# Patient Record
Sex: Female | Born: 1955
Health system: Southern US, Community
[De-identification: ages and names within clinical notes are randomized; demographics above are authoritative.]

## PROBLEM LIST (undated history)

## (undated) DIAGNOSIS — R011 Cardiac murmur, unspecified: Secondary | ICD-10-CM

## (undated) DIAGNOSIS — E119 Type 2 diabetes mellitus without complications: Secondary | ICD-10-CM

## (undated) DIAGNOSIS — I10 Essential (primary) hypertension: Secondary | ICD-10-CM

## (undated) DIAGNOSIS — E785 Hyperlipidemia, unspecified: Secondary | ICD-10-CM

## (undated) DIAGNOSIS — E079 Disorder of thyroid, unspecified: Secondary | ICD-10-CM

## (undated) DIAGNOSIS — M1990 Unspecified osteoarthritis, unspecified site: Secondary | ICD-10-CM

## (undated) HISTORY — DX: Cardiac murmur, unspecified: R01.1

## (undated) HISTORY — DX: Hyperlipidemia, unspecified: E78.5

## (undated) HISTORY — DX: Essential (primary) hypertension: I10

## (undated) HISTORY — DX: Unspecified osteoarthritis, unspecified site: M19.90

## (undated) HISTORY — DX: Disorder of thyroid, unspecified: E07.9

## (undated) LAB — LIPID PANEL
HDL: 35 mg/dL (ref 35–70)
Triglycerides: 166 mg/dL — AB (ref 40–160)

## (undated) LAB — BASIC METABOLIC PANEL
Potassium: 4.7 mmol/L (ref 3.4–5.3)
Sodium: 140 mmol/L (ref 137–147)

## (undated) LAB — HEPATIC FUNCTION PANEL
ALT: 7 U/L (ref 7–35)
AST: 9 U/L — AB (ref 13–35)

---

## 1999-08-11 HISTORY — PX: ABDOMINAL HYSTERECTOMY: SHX81

## 2005-08-10 LAB — HM COLONOSCOPY: HM COLON: NORMAL

## 2005-09-11 ENCOUNTER — Ambulatory Visit: Payer: Self-pay | Admitting: Family Medicine

## 2006-02-09 ENCOUNTER — Ambulatory Visit: Payer: Self-pay | Admitting: Gastroenterology

## 2006-12-21 ENCOUNTER — Ambulatory Visit: Payer: Self-pay | Admitting: Family Medicine

## 2008-01-04 ENCOUNTER — Ambulatory Visit: Payer: Self-pay | Admitting: Family Medicine

## 2008-03-06 DIAGNOSIS — F17210 Nicotine dependence, cigarettes, uncomplicated: Secondary | ICD-10-CM | POA: Insufficient documentation

## 2009-01-15 ENCOUNTER — Ambulatory Visit: Payer: Self-pay | Admitting: Family Medicine

## 2009-01-29 ENCOUNTER — Ambulatory Visit: Payer: Self-pay | Admitting: Family Medicine

## 2010-02-26 DIAGNOSIS — E559 Vitamin D deficiency, unspecified: Secondary | ICD-10-CM | POA: Insufficient documentation

## 2010-04-02 ENCOUNTER — Ambulatory Visit: Payer: Self-pay | Admitting: Family Medicine

## 2010-05-27 LAB — HM PAP SMEAR: HM PAP: NORMAL

## 2011-05-20 ENCOUNTER — Ambulatory Visit: Payer: Self-pay | Admitting: Family Medicine

## 2011-07-22 ENCOUNTER — Ambulatory Visit: Payer: Self-pay | Admitting: Family Medicine

## 2012-05-24 ENCOUNTER — Ambulatory Visit: Payer: Self-pay | Admitting: Family Medicine

## 2013-01-09 ENCOUNTER — Ambulatory Visit: Payer: Self-pay | Admitting: Family Medicine

## 2013-01-09 LAB — HM DEXA SCAN: HM Dexa Scan: NORMAL

## 2013-06-06 ENCOUNTER — Ambulatory Visit: Payer: Self-pay | Admitting: Family Medicine

## 2014-03-05 LAB — LIPID PANEL
Cholesterol: 132 mg/dL (ref 0–200)
HDL: 42 mg/dL (ref 35–70)
LDL Cholesterol: 62 mg/dL
TRIGLYCERIDES: 140 mg/dL (ref 40–160)

## 2014-06-07 ENCOUNTER — Ambulatory Visit: Payer: Self-pay | Admitting: Family Medicine

## 2014-06-07 LAB — HM MAMMOGRAPHY: HM Mammogram: NORMAL

## 2014-08-18 ENCOUNTER — Emergency Department: Payer: Self-pay | Admitting: Emergency Medicine

## 2014-08-18 LAB — URINALYSIS, COMPLETE
Bilirubin,UR: NEGATIVE
GLUCOSE, UR: NEGATIVE mg/dL (ref 0–75)
Ketone: NEGATIVE
NITRITE: POSITIVE
Ph: 6 (ref 4.5–8.0)
Protein: 100
RBC,UR: 61 /HPF (ref 0–5)
SPECIFIC GRAVITY: 1.013 (ref 1.003–1.030)
Squamous Epithelial: 3
WBC UR: 344 /HPF (ref 0–5)

## 2014-08-18 LAB — COMPREHENSIVE METABOLIC PANEL
Albumin: 3.5 g/dL (ref 3.4–5.0)
Alkaline Phosphatase: 105 U/L
Anion Gap: 5 — ABNORMAL LOW (ref 7–16)
BUN: 9 mg/dL (ref 7–18)
Bilirubin,Total: 0.4 mg/dL (ref 0.2–1.0)
CHLORIDE: 100 mmol/L (ref 98–107)
CREATININE: 1.06 mg/dL (ref 0.60–1.30)
Calcium, Total: 9.7 mg/dL (ref 8.5–10.1)
Co2: 28 mmol/L (ref 21–32)
EGFR (Non-African Amer.): 57 — ABNORMAL LOW
Glucose: 109 mg/dL — ABNORMAL HIGH (ref 65–99)
OSMOLALITY: 266 (ref 275–301)
Potassium: 3.9 mmol/L (ref 3.5–5.1)
SGOT(AST): 11 U/L — ABNORMAL LOW (ref 15–37)
SGPT (ALT): 18 U/L
SODIUM: 133 mmol/L — AB (ref 136–145)
Total Protein: 7.2 g/dL (ref 6.4–8.2)

## 2014-08-18 LAB — CBC
HCT: 35.7 % (ref 35.0–47.0)
HGB: 11.7 g/dL — ABNORMAL LOW (ref 12.0–16.0)
MCH: 25.8 pg — ABNORMAL LOW (ref 26.0–34.0)
MCHC: 32.6 g/dL (ref 32.0–36.0)
MCV: 79 fL — AB (ref 80–100)
Platelet: 191 10*3/uL (ref 150–440)
RBC: 4.52 10*6/uL (ref 3.80–5.20)
RDW: 14.5 % (ref 11.5–14.5)
WBC: 14.8 10*3/uL — ABNORMAL HIGH (ref 3.6–11.0)

## 2014-08-18 LAB — ED INFLUENZA
H1N1FLUPCR: NOT DETECTED
INFLBPCR: NEGATIVE
Influenza A By PCR: NEGATIVE

## 2014-08-20 LAB — URINE CULTURE

## 2014-10-18 LAB — HEMOGLOBIN A1C: Hgb A1c MFr Bld: 5.4 % (ref 4.0–6.0)

## 2014-11-29 ENCOUNTER — Encounter: Payer: Self-pay | Admitting: Family Medicine

## 2015-01-20 ENCOUNTER — Encounter: Payer: Self-pay | Admitting: Family Medicine

## 2015-01-20 DIAGNOSIS — E133299 Other specified diabetes mellitus with mild nonproliferative diabetic retinopathy without macular edema, unspecified eye: Secondary | ICD-10-CM | POA: Insufficient documentation

## 2015-01-20 DIAGNOSIS — E039 Hypothyroidism, unspecified: Secondary | ICD-10-CM | POA: Insufficient documentation

## 2015-01-20 DIAGNOSIS — Z862 Personal history of diseases of the blood and blood-forming organs and certain disorders involving the immune mechanism: Secondary | ICD-10-CM | POA: Insufficient documentation

## 2015-01-20 DIAGNOSIS — M25579 Pain in unspecified ankle and joints of unspecified foot: Secondary | ICD-10-CM | POA: Insufficient documentation

## 2015-01-20 DIAGNOSIS — E669 Obesity, unspecified: Secondary | ICD-10-CM | POA: Insufficient documentation

## 2015-01-20 DIAGNOSIS — E1142 Type 2 diabetes mellitus with diabetic polyneuropathy: Secondary | ICD-10-CM | POA: Insufficient documentation

## 2015-01-20 DIAGNOSIS — E785 Hyperlipidemia, unspecified: Secondary | ICD-10-CM | POA: Insufficient documentation

## 2015-01-20 DIAGNOSIS — E13319 Other specified diabetes mellitus with unspecified diabetic retinopathy without macular edema: Secondary | ICD-10-CM

## 2015-01-20 DIAGNOSIS — E668 Other obesity: Secondary | ICD-10-CM | POA: Insufficient documentation

## 2015-01-20 DIAGNOSIS — H35039 Hypertensive retinopathy, unspecified eye: Secondary | ICD-10-CM | POA: Insufficient documentation

## 2015-01-20 DIAGNOSIS — J309 Allergic rhinitis, unspecified: Secondary | ICD-10-CM | POA: Insufficient documentation

## 2015-01-20 DIAGNOSIS — D72829 Elevated white blood cell count, unspecified: Secondary | ICD-10-CM | POA: Insufficient documentation

## 2015-01-20 DIAGNOSIS — H11139 Conjunctival pigmentations, unspecified eye: Secondary | ICD-10-CM | POA: Insufficient documentation

## 2015-01-20 DIAGNOSIS — B353 Tinea pedis: Secondary | ICD-10-CM | POA: Insufficient documentation

## 2015-01-20 DIAGNOSIS — I10 Essential (primary) hypertension: Secondary | ICD-10-CM | POA: Insufficient documentation

## 2015-01-21 ENCOUNTER — Ambulatory Visit (INDEPENDENT_AMBULATORY_CARE_PROVIDER_SITE_OTHER): Payer: BLUE CROSS/BLUE SHIELD | Admitting: Family Medicine

## 2015-01-21 ENCOUNTER — Encounter: Payer: Self-pay | Admitting: Family Medicine

## 2015-01-21 VITALS — BP 118/72 | HR 80 | Temp 98.5°F | Resp 16 | Ht 67.0 in | Wt 264.2 lb

## 2015-01-21 DIAGNOSIS — M25562 Pain in left knee: Secondary | ICD-10-CM

## 2015-01-21 DIAGNOSIS — E119 Type 2 diabetes mellitus without complications: Secondary | ICD-10-CM | POA: Diagnosis not present

## 2015-01-21 DIAGNOSIS — H35039 Hypertensive retinopathy, unspecified eye: Secondary | ICD-10-CM | POA: Diagnosis not present

## 2015-01-21 DIAGNOSIS — B353 Tinea pedis: Secondary | ICD-10-CM | POA: Diagnosis not present

## 2015-01-21 DIAGNOSIS — G8929 Other chronic pain: Secondary | ICD-10-CM | POA: Diagnosis not present

## 2015-01-21 DIAGNOSIS — E1340 Other specified diabetes mellitus with diabetic neuropathy, unspecified: Secondary | ICD-10-CM | POA: Diagnosis not present

## 2015-01-21 DIAGNOSIS — D72829 Elevated white blood cell count, unspecified: Secondary | ICD-10-CM

## 2015-01-21 DIAGNOSIS — R600 Localized edema: Secondary | ICD-10-CM | POA: Diagnosis not present

## 2015-01-21 DIAGNOSIS — E785 Hyperlipidemia, unspecified: Secondary | ICD-10-CM | POA: Diagnosis not present

## 2015-01-21 DIAGNOSIS — E1342 Other specified diabetes mellitus with diabetic polyneuropathy: Secondary | ICD-10-CM

## 2015-01-21 DIAGNOSIS — I10 Essential (primary) hypertension: Secondary | ICD-10-CM

## 2015-01-21 LAB — POCT UA - MICROALBUMIN: Microalbumin Ur, POC: NEGATIVE mg/L

## 2015-01-21 LAB — POCT GLYCOSYLATED HEMOGLOBIN (HGB A1C): Hemoglobin A1C: 5.6

## 2015-01-21 MED ORDER — METOPROLOL SUCCINATE ER 200 MG PO TB24: 200.0000 mg | ORAL_TABLET | Freq: Every day | ORAL | Status: DC

## 2015-01-21 MED ORDER — METFORMIN HCL 500 MG PO TABS: 500.0000 mg | ORAL_TABLET | Freq: Every day | ORAL | Status: DC

## 2015-01-21 MED ORDER — MELOXICAM 7.5 MG PO TABS: 7.5000 mg | ORAL_TABLET | Freq: Every day | ORAL | Status: DC

## 2015-01-21 MED ORDER — AMLODIPINE BESY-BENAZEPRIL HCL 5-40 MG PO CAPS: 1.0000 | ORAL_CAPSULE | Freq: Every day | ORAL | Status: DC

## 2015-01-21 MED ORDER — TERBINAFINE HCL 250 MG PO TABS: 250.0000 mg | ORAL_TABLET | Freq: Every day | ORAL | Status: DC

## 2015-01-21 MED ORDER — PRAVASTATIN SODIUM 40 MG PO TABS: 40.0000 mg | ORAL_TABLET | Freq: Every day | ORAL | Status: DC

## 2015-01-21 MED ORDER — ACETAMINOPHEN 500 MG PO TABS: 500.0000 mg | ORAL_TABLET | Freq: Two times a day (BID) | ORAL | Status: AC

## 2015-01-21 NOTE — Patient Instructions (Signed)

## 2015-01-21 NOTE — Progress Notes (Signed)
Name: Charlene Hardy   MRN: [id number]    DOB: 01-Jun-1956   Date:01/21/2015       Progress Note  Subjective  Chief Complaint  Chief Complaint  Patient presents with  . Hypertension    some edema in left ankle/foot  . Hyperlipidemia  . Hypothyroidism    dry skin  . Diabetes    has not been checking BG    HPI  Left lower extremity swelling: she developed left knee effusion about one month ago, she denies any falls or change in activity ( she always works standing up for 8 hours ), swelling descended to her left ankle, worse after activity and better after rest, painful only after work, but usually no pain. No redness, no increase in warmth.  No rashes.  She has tinea pedis and was not able to afford cream for therapy.  Denies calf tenderness.  Swelling is getting worse again and going up to her leg. No previous history of DVT's  HTN: taking medication, bp is at goal, swelling unlikely related to amlodipine since it is unilateral  DM: taking metformin, she tries to eat a diabetic diet, she states she is only active at work, and explained importance of exercising at least 30 minutes daily. Eye exam is up to date. May need to see a podiatrist for nail trimming - but she refuses at this time  Hypothyroidism: she denies constipation or fatigue, but she has dry skin.  Taking levothyroxine and seems to be controlling symptoms   Hyperlipidemia: taking Pravastatin and denies side effects, last HDL was low and discussed importance of eating tree nuts, fish and exercise   Patient Active Problem List   Diagnosis Date Noted  . Allergic rhinitis 01/20/2015  . 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/2015  . Complication of diabetes mellitus 01/20/2015  . Diabetic sensorimotor neuropathy 01/20/2015  . Dyslipidemia 01/20/2015  . Essential (primary) hypertension 01/20/2015  . H/O iron deficiency anemia 01/20/2015  . Calcium blood increased 01/20/2015  . Hypertensive retinopathy 01/20/2015  . Adult  hypothyroidism 01/20/2015  . Ankle pain 01/20/2015  . Extreme obesity 01/20/2015  . Background retinopathy due to secondary diabetes 01/20/2015  . Tinea pedis 01/20/2015  . Leucocytosis 01/20/2015  . Vitamin D deficiency 02/26/2010  . Cigarette smoker 03/06/2008    Past Surgical History  Procedure Laterality Date  . Abdominal hysterectomy  08/11/1999    still has ovaries    Family History  Problem Relation Age of Onset  . Multiple sclerosis Mother   . Diabetes Father     History   Social History  . Marital Status: Single    Spouse Name: N/A  . Number of Children: N/A  . Years of Education: N/A   Occupational History  . Not on file.   Social History Main Topics  . Smoking status: Current Every Day Smoker -- 1.00 packs/day for 37 years    Types: Cigarettes    Start date: 08/10/1976  . Smokeless tobacco: Never Used  . Alcohol Use: No  . Drug Use: No  . Sexual Activity: Not Currently   Other Topics Concern  . Not on file   Social History Narrative     Current outpatient prescriptions:  .  Aclidinium Bromide (TUDORZA PRESSAIR) 400 MCG/ACT AEPB, Inhale 1 puff into the lungs 2 (two) times daily., Disp: , Rfl:  .  amLODipine-benazepril (LOTREL) 5-40 MG per capsule, Take 1 capsule by mouth daily., Disp: 90 capsule, Rfl: 1 .  aspirin 81 MG tablet, Take 1 tablet by  mouth daily., Disp: , Rfl:  .  Cholecalciferol (VITAMIN D) 2000 UNITS tablet, Take 1 tablet by mouth daily., Disp: , Rfl:  .  ferrous sulfate 325 (65 FE) MG tablet, Take 1 tablet by mouth daily., Disp: , Rfl:  .  levothyroxine (SYNTHROID) 112 MCG tablet, Take 1 tablet by mouth daily., Disp: , Rfl:  .  loratadine (CLARITIN) 10 MG tablet, Take 1 tablet by mouth daily., Disp: , Rfl:  .  meloxicam (MOBIC) 7.5 MG tablet, Take 1 tablet (7.5 mg total) by mouth daily., Disp: 90 tablet, Rfl: 1 .  metFORMIN (GLUCOPHAGE) 500 MG tablet, Take 1 tablet (500 mg total) by mouth daily., Disp: 90 tablet, Rfl: 1 .  metoprolol  (TOPROL-XL) 200 MG 24 hr tablet, Take 1 tablet (200 mg total) by mouth at bedtime., Disp: 90 tablet, Rfl: 1 .  MULTIPLE VITAMINS-MINERALS ER PO, Take 1 tablet by mouth daily., Disp: , Rfl:  .  omega-3 fish oil (MAXEPA) 1000 MG CAPS capsule, Take 1 capsule by mouth daily., Disp: , Rfl:  .  Polyethylene Glycol 3350 POWD, Take 1 Dose by mouth as needed., Disp: , Rfl:  .  pravastatin (PRAVACHOL) 40 MG tablet, Take 1 tablet (40 mg total) by mouth daily., Disp: 90 tablet, Rfl: 1 .  acetaminophen (TYLENOL) 500 MG tablet, Take 1 tablet (500 mg total) by mouth 2 (two) times daily., Disp: 30 tablet, Rfl: 0 .  terbinafine (LAMISIL) 250 MG tablet, Take 1 tablet (250 mg total) by mouth daily., Disp: 14 tablet, Rfl: 0  Allergies  Allergen Reactions  . Hydrochlorothiazide     hyperparathyroidism  . Pneumovax [Pneumococcal Polysaccharide Vaccine]      ROS  Constitutional: Negative for fever or significant weight change.  Respiratory: Negative for shortness of breath.  occasional cough Cardiovascular: Negative for chest pain or palpitations.  Gastrointestinal: Negative for abdominal pain, no bowel changes.  Musculoskeletal: Negative for gait problem , she has left knee pain and now left ankle swelling Skin: Negative for rash.  Neurological: Negative for dizziness or headache.  No other specific complaints in a complete review of systems (except as listed in HPI above).  Objective  Filed Vitals:   01/21/15 0935  BP: 118/72  Pulse: 80  Temp: 98.5 F (36.9 C)  TempSrc: Oral  Resp: 16  Height: '5\' 7"'  (1.702 m)  Weight: 264 lb 3.2 oz (119.84 kg)  SpO2: 97%    Body mass index is 41.37 kg/(m^2).  Physical Exam   Constitutional: Patient appears well-developed and well-nourished. No distress. Obese HENT: Head: Normocephalic and atraumati Nose: Nose normal. Mouth/Throat: Oropharynx is clear and moist. No oropharyngeal exudate.  Eyes: Conjunctivae and EOM are normal. Pupils are equal, round, and  reactive to light. No scleral icterus.  Neck: Normal range of motion. Neck supple. No JVD present. No thyromegaly present.  Cardiovascular: Normal rate, regular rhythm and normal heart sounds.  No murmur heard.She has 3 plus pitting on left ankle, 2 plus on distal tibia, neg calf tenderness, no tenderness to touch of ankle, normal passive ROB, no redness or increase in warmth Pulmonary/Chest: Effort normal , scattered rhonchi. No respiratory distress. Abdominal: Soft. Bowel sounds are normal, no distension. There is no tenderness. no masses Musculoskeletal: Normal range of motion.No gross deformities Neurological: he is alert and oriented to person, place, and time. No cranial nerve deficit. Coordination, balance, strength, speech and gait are normal.  Skin: Skin is warm and dry. No rash noted. No erythema.  Psychiatric: Patient has a normal  mood and affect. behavior is normal. Judgment and thought content normal.   Recent Results (from the past 2160 hour(s))  POCT UA - Microalbumin     Status: Normal   Collection Time: 01/21/15 10:03 AM  Result Value Ref Range   Microalbumin Ur, POC negative mg/L   Creatinine, POC  mg/dL   Albumin/Creatinine Ratio, Urine, POC    POCT HgB A1C     Status: Normal   Collection Time: 01/21/15 10:08 AM  Result Value Ref Range   Hemoglobin A1C 5.6     Diabetic Foot Exam: Diabetic Foot Exam - Simple   Simple Foot Form  Visual Inspection  See comments:  Yes  Sensation Testing  Intact to touch and monofilament testing bilaterally:  Yes  Pulse Check  See comments:  Yes  Comments  Swollen left foot, hard to feel posterior tibialis pulse, long thick nails and tinea pedis noticed       PHQ2/9: Depression screen PHQ 2/9 01/21/2015  Decreased Interest 0  Down, Depressed, Hopeless 0  PHQ - 2 Score 0     Fall Risk: Fall Risk  01/21/2015  Falls in the past year? No     Assessment & Plan  1. Secondary diabetes with peripheral  neuropathy Monofilament, she has neuropathy - usually associated with pain, but not in pain at this time - POCT UA - Microalbumin - POCT HgB A1C - metFORMIN (GLUCOPHAGE) 500 MG tablet; Take 1 tablet (500 mg total) by mouth daily.  Dispense: 90 tablet; Refill: 1  2. Tinea pedis of both feet Could not afford topical medication, we will try po medication - terbinafine (LAMISIL) 250 MG tablet; Take 1 tablet (250 mg total) by mouth daily.  Dispense: 14 tablet; Refill: 0  3. Leucocytosis Recheck levels - CBC  4. Edema of left lower extremity Needs to rule out vascular problem, non tender, unlikely gout, but she has neuropathy - Ambulatory referral to Vascular Surgery - Uric acid - Sed Rate (ESR) X-ray  5. Essential (primary) hypertension At goal, continue medication  6. Hypertensive retinopathy, unspecified laterality Continue follow up with ophthalmologist and good bp control - amLODipine-benazepril (LOTREL) 5-40 MG per capsule; Take 1 capsule by mouth daily.  Dispense: 90 capsule; Refill: 1 - metoprolol (TOPROL-XL) 200 MG 24 hr tablet; Take 1 tablet (200 mg total) by mouth at bedtime.  Dispense: 90 tablet; Refill: 1  7. Dyslipidemia  - pravastatin (PRAVACHOL) 40 MG tablet; Take 1 tablet (40 mg total) by mouth daily.  Dispense: 90 tablet; Refill: 1  8. Knee pain, chronic, left Advised to take Tylenol instead of meloxicam daily if possible - acetaminophen (TYLENOL) 500 MG tablet; Take 1 tablet (500 mg total) by mouth 2 (two) times daily.  Dispense: 30 tablet; Refill: 0 - meloxicam (MOBIC) 7.5 MG tablet; Take 1 tablet (7.5 mg total) by mouth daily.  Dispense: 90 tablet; Refill: 1

## 2015-01-22 LAB — CBC
Hematocrit: 39.8 % (ref 34.0–46.6)
Hemoglobin: 12.7 g/dL (ref 11.1–15.9)
MCH: 25.8 pg — AB (ref 26.6–33.0)
MCHC: 31.9 g/dL (ref 31.5–35.7)
MCV: 81 fL (ref 79–97)
PLATELETS: 283 10*3/uL (ref 150–379)
RBC: 4.92 x10E6/uL (ref 3.77–5.28)
RDW: 14 % (ref 12.3–15.4)
WBC: 9.3 10*3/uL (ref 3.4–10.8)

## 2015-01-22 LAB — URIC ACID: Uric Acid: 6.5 mg/dL (ref 2.5–7.1)

## 2015-01-22 LAB — SEDIMENTATION RATE: Sed Rate: 8 mm/hr (ref 0–40)

## 2015-04-25 ENCOUNTER — Encounter: Payer: Self-pay | Admitting: Family Medicine

## 2015-04-25 ENCOUNTER — Ambulatory Visit (INDEPENDENT_AMBULATORY_CARE_PROVIDER_SITE_OTHER): Payer: BLUE CROSS/BLUE SHIELD | Admitting: Family Medicine

## 2015-04-25 VITALS — BP 126/84 | HR 84 | Temp 98.8°F | Resp 16 | Ht 67.0 in | Wt 266.0 lb

## 2015-04-25 DIAGNOSIS — M25572 Pain in left ankle and joints of left foot: Secondary | ICD-10-CM | POA: Diagnosis not present

## 2015-04-25 DIAGNOSIS — E559 Vitamin D deficiency, unspecified: Secondary | ICD-10-CM

## 2015-04-25 DIAGNOSIS — F17210 Nicotine dependence, cigarettes, uncomplicated: Secondary | ICD-10-CM

## 2015-04-25 DIAGNOSIS — Z114 Encounter for screening for human immunodeficiency virus [HIV]: Secondary | ICD-10-CM

## 2015-04-25 DIAGNOSIS — Z720 Tobacco use: Secondary | ICD-10-CM

## 2015-04-25 DIAGNOSIS — E038 Other specified hypothyroidism: Secondary | ICD-10-CM

## 2015-04-25 DIAGNOSIS — B353 Tinea pedis: Secondary | ICD-10-CM | POA: Diagnosis not present

## 2015-04-25 DIAGNOSIS — E1142 Type 2 diabetes mellitus with diabetic polyneuropathy: Secondary | ICD-10-CM

## 2015-04-25 DIAGNOSIS — E118 Type 2 diabetes mellitus with unspecified complications: Secondary | ICD-10-CM

## 2015-04-25 DIAGNOSIS — Z23 Encounter for immunization: Secondary | ICD-10-CM | POA: Diagnosis not present

## 2015-04-25 DIAGNOSIS — K5900 Constipation, unspecified: Secondary | ICD-10-CM | POA: Insufficient documentation

## 2015-04-25 DIAGNOSIS — I10 Essential (primary) hypertension: Secondary | ICD-10-CM | POA: Diagnosis not present

## 2015-04-25 DIAGNOSIS — E785 Hyperlipidemia, unspecified: Secondary | ICD-10-CM | POA: Diagnosis not present

## 2015-04-25 LAB — POCT UA - MICROALBUMIN: MICROALBUMIN (UR) POC: 20 mg/L

## 2015-04-25 MED ORDER — NAFTIFINE HCL 2 % EX CREA: 1.0000 g | TOPICAL_CREAM | Freq: Two times a day (BID) | CUTANEOUS | Status: DC

## 2015-04-25 NOTE — Progress Notes (Signed)
Called prescription into [facility] at 11:05 a.m. TJ

## 2015-04-25 NOTE — Progress Notes (Signed)
Name: Charlene Hardy   MRN: [id number]    DOB: [birth year]   Date:04/25/2015       Progress Note  Subjective  Chief Complaint  Chief Complaint  Patient presents with  . Medication Refill    follow-up  . Hypertension  . Hyperlipidemia  . Hypothyroidism  . Gout    left ankle    HPI  HTN: taking medication as prescribed, bp is at goal, denies side effects. No chest pain or palpitation  Hyperlipidemia: she has been taking Pravastatin denies muscles aches.  Compliant with medication.  Left ankle pain: uric acid was normal in June.  She has been having pain daily, and swells, seen by vascular surgeon and advised to wear compression stocking hoses. Works on [employer] and left ankle is a little swollen when she does not wear compression stocking hoses. Pain is described as aching, intermittent, worse with activity and resolves with rest. No redness, no increase in warmth.  Explained to her it is not gout  Hypothyroidism: taking medication as prescribed, constipation is controlled, she has chronic dry skin. No dysphagia.  DMII: taking Metformin, she has diabetic retinopahty, also peripheral neuropathy - only lack of sensation / denies pain. She is compliant with medication, denies polyphagia, polyuria or polydipsia. She is due for an eye exam, and patient wants to call them herself.    Patient Active Problem List   Diagnosis Date Noted  . Allergic rhinitis 01/20/2015  . 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/2015  . Complication of diabetes mellitus 01/20/2015  . Diabetic sensorimotor neuropathy 01/20/2015  . Dyslipidemia 01/20/2015  . Essential (primary) hypertension 01/20/2015  . H/O iron deficiency anemia 01/20/2015  . Hypertensive retinopathy 01/20/2015  . Adult hypothyroidism 01/20/2015  . Extreme obesity 01/20/2015  . Background retinopathy due to secondary diabetes 01/20/2015  . Tinea pedis 01/20/2015  . Vitamin D deficiency 02/26/2010  . Cigarette smoker 03/06/2008    Past  Surgical History  Procedure Laterality Date  . Abdominal hysterectomy  08/11/1999    still has ovaries    Family History  Problem Relation Age of Onset  . Multiple sclerosis Mother   . Diabetes Father     Social History   Social History  . Marital Status: Single    Spouse Name: N/A  . Number of Children: N/A  . Years of Education: N/A   Occupational History  . Not on file.   Social History Main Topics  . Smoking status: Current Every Day Smoker -- 1.00 packs/day for 37 years    Types: Cigarettes    Start date: 08/10/1976  . Smokeless tobacco: Never Used  . Alcohol Use: No  . Drug Use: No  . Sexual Activity: Not Currently   Other Topics Concern  . Not on file   Social History Narrative     Current outpatient prescriptions:  .  acetaminophen (TYLENOL) 500 MG tablet, Take 1 tablet (500 mg total) by mouth 2 (two) times daily., Disp: 30 tablet, Rfl: 0 .  Aclidinium Bromide (TUDORZA PRESSAIR) 400 MCG/ACT AEPB, Inhale 1 puff into the lungs 2 (two) times daily., Disp: , Rfl:  .  amLODipine-benazepril (LOTREL) 5-40 MG per capsule, Take 1 capsule by mouth daily., Disp: 90 capsule, Rfl: 1 .  aspirin 81 MG tablet, Take 1 tablet by mouth daily., Disp: , Rfl:  .  Cholecalciferol (VITAMIN D) 2000 UNITS tablet, Take 1 tablet by mouth daily., Disp: , Rfl:  .  ferrous sulfate 325 (65 FE) MG tablet, Take 1 tablet by mouth daily.,  Disp: , Rfl:  .  levothyroxine (SYNTHROID) 112 MCG tablet, Take 1 tablet by mouth daily., Disp: , Rfl:  .  loratadine (CLARITIN) 10 MG tablet, Take 1 tablet by mouth daily., Disp: , Rfl:  .  meloxicam (MOBIC) 7.5 MG tablet, Take 1 tablet (7.5 mg total) by mouth daily., Disp: 90 tablet, Rfl: 1 .  metFORMIN (GLUCOPHAGE) 500 MG tablet, Take 1 tablet (500 mg total) by mouth daily., Disp: 90 tablet, Rfl: 1 .  metoprolol (TOPROL-XL) 200 MG 24 hr tablet, Take 1 tablet (200 mg total) by mouth at bedtime., Disp: 90 tablet, Rfl: 1 .  MULTIPLE VITAMINS-MINERALS ER PO,  Take 1 tablet by mouth daily., Disp: , Rfl:  .  omega-3 fish oil (MAXEPA) 1000 MG CAPS capsule, Take 1 capsule by mouth daily., Disp: , Rfl:  .  Polyethylene Glycol 3350 POWD, Take 1 Dose by mouth as needed., Disp: , Rfl:  .  pravastatin (PRAVACHOL) 40 MG tablet, Take 1 tablet (40 mg total) by mouth daily., Disp: 90 tablet, Rfl: 1  Allergies  Allergen Reactions  . Hydrochlorothiazide     hyperparathyroidism  . Pneumovax [Pneumococcal Polysaccharide Vaccine]      ROS  Constitutional: Negative for fever or significant weight change.  Respiratory: Negative for cough and shortness of breath.   Cardiovascular: Negative for chest pain or palpitations.  Gastrointestinal: Negative for abdominal pain, no bowel changes.  Musculoskeletal: Negative for gait problem positive for joint swelling - left ankle   Skin: Positive  for rash.  Neurological: Negative for dizziness or headache.  No other specific complaints in a complete review of systems (except as listed in HPI above).  Objective  Filed Vitals:   04/25/15 0807  BP: 126/84  Pulse: 84  Temp: 98.8 F (37.1 C)  TempSrc: Oral  Resp: 16  Height: 5\' 7"  (1.702 m)  Weight: 266 lb (120.657 kg)  SpO2: 97%    Body mass index is 41.65 kg/(m^2).  Physical Exam  Constitutional: Patient appears well-developed and well-nourished. Obese No distress.  HEENT: head atraumatic, normocephalic, pupils equal and reactive to light,  neck supple, throat within normal limits Cardiovascular: Normal rate, regular rhythm and normal heart sounds.  No murmur heard. No BLE edema. Pulmonary/Chest: Effort normal and breath sounds normal. No respiratory distress. Abdominal: Soft.  There is no tenderness. Psychiatric: Patient has a normal mood and affect. behavior is normal. Judgment and thought content normal. Muscular Skeletal: crepitus with extension of both knees, ankles normal rom, some swelling on medical left ankle, no redness or increase in  warmth  Diabetic Foot Exam - Simple   Simple Foot Form  Diabetic Foot exam was performed with the following findings:  Yes 04/25/2015  8:40 AM  Visual Inspection  See comments:  Yes  Sensation Testing  See comments:  Yes  Pulse Check  Posterior Tibialis and Dorsalis pulse intact bilaterally:  Yes  Comments  Failed monofilament test Thick toenails, maceration on toe webs     PHQ2/9: Depression screen [facility] 2/9 04/25/2015 01/21/2015  Decreased Interest 0 0  Down, Depressed, Hopeless 0 0  PHQ - 2 Score 0 0     Fall Risk: Fall Risk  04/25/2015 01/21/2015  Falls in the past year? No No      Functional Status Survey: Is the patient deaf or have difficulty hearing?: No Does the patient have difficulty seeing, even when wearing glasses/contacts?: Yes (glasses) Does the patient have difficulty concentrating, remembering, or making decisions?: No Does the patient have difficulty  walking or climbing stairs?: No Does the patient have difficulty dressing or bathing?: No Does the patient have difficulty doing errands alone such as visiting a doctor's office or shopping?: No    Assessment & Plan  1. Type II diabetes mellitus with manifestations Doing well, check levels.  Advised to check feet daily because of lack of sensation, also to follow diabetic diet and try to lose weight, but increasing physical activity and portion control - POCT UA - Microalbumin - Hemoglobin A1c  2. Needs flu shot  - Flu Vaccine QUAD 36+ mos PF IM (Fluarix & Fluzone Quad PF)  3. Dyslipidemia  - Lipid panel  4. Essential (primary) hypertension At goal, continue medication  - Comprehensive metabolic panel  5. Diabetic sensorimotor neuropathy Also advised to have good shoes that fits her well  6. Other specified hypothyroidism  - TSH  7. Cigarette smoker Discussed importance of quitting, and discussed Wellbutrin, Chantix and nicotine patch, she is trying vapors.  Discussed quit date, she does not  want medications at this time  8. Vitamin D deficiency  - Vit D  25 hydroxy (rtn osteoporosis monitoring)  9. Encounter for screening for HIV  - HIV antibody  10. Left ankle pain Likely OA, normal vascular studies ( per patient ), continue compression stocking hoses to help with swelling, advised to take Tylenol and Meloxicam seldom because of risk of kidney damage in the long run  11. Tinea pedis of both feet Advised to dry toe webs daily  - Naftifine HCl 2 % CREA; Apply 1 g topically 2 (two) times daily.  Dispense: 60 g; Refill: 1

## 2015-04-25 NOTE — Patient Instructions (Signed)
Nicotine Addiction Nicotine can act as both a stimulant (excites/activates) and a sedative (calms/quiets). Immediately after exposure to nicotine, there is a "kick" caused in part by the drug's stimulation of the adrenal glands and resulting discharge of adrenaline (epinephrine). The rush of adrenaline stimulates the body and causes a sudden release of sugar. This means that smokers are always slightly hyperglycemic. Hyperglycemic means that the blood sugar is high, just like in diabetics. Nicotine also decreases the amount of insulin which helps control sugar levels in the body. There is an increase in blood pressure, breathing, and the rate of heart beats.  In addition, nicotine indirectly causes a release of dopamine in the brain that controls pleasure and motivation. A similar reaction is seen with other drugs of abuse, such as cocaine and heroin. This dopamine release is thought to cause the pleasurable sensations when smoking. In some different cases, nicotine can also create a calming effect, depending on sensitivity of the smoker's nervous system and the dose of nicotine taken. WHAT HAPPENS WHEN NICOTINE IS TAKEN FOR LONG PERIODS OF TIME?  Long-term use of nicotine results in addiction. It is difficult to stop.  Repeated use of nicotine creates tolerance. Higher doses of nicotine are needed to get the "kick." When nicotine use is stopped, withdrawal may last a month or more. Withdrawal may begin within a few hours after the last cigarette. Symptoms peak within the first few days and may lessen within a few weeks. For some people, however, symptoms may last for months or longer. Withdrawal symptoms include:   Irritability.  Craving.  Learning and attention deficits.  Sleep disturbances.  Increased appetite. Craving for tobacco may last for 6 months or longer. Many behaviors done while using nicotine can also play a part in the severity of withdrawal symptoms. For some people, the feel,  smell, and sight of a cigarette and the ritual of obtaining, handling, lighting, and smoking the cigarette are closely linked with the pleasure of smoking. When stopped, they also miss the related behaviors which make the withdrawal or craving worse. While nicotine gum and patches may lessen the drug aspects of withdrawal, cravings often persist. WHAT ARE THE MEDICAL CONSEQUENCES OF NICOTINE USE?  Nicotine addiction accounts for one-third of all cancers. The top cancer caused by tobacco is lung cancer. Lung cancer is the number one cancer killer of both men and women.  Smoking is also associated with cancers of the:  Mouth.  Pharynx.  Larynx.  Esophagus.  Stomach.  Pancreas.  Cervix.  Kidney.  Ureter.  Bladder.  Smoking also causes lung diseases such as lasting (chronic) bronchitis and emphysema.  It worsens asthma in adults and children.  Smoking increases the risk of heart disease, including:  Stroke.  Heart attack.  Vascular disease.  Aneurysm.  Passive or secondary smoke can also increase medical risks including:  Asthma in children.  Sudden Infant Death Syndrome (SIDS).  Additionally, dropped cigarettes are the leading cause of residential fire fatalities.  Nicotine poisoning has been reported from accidental ingestion of tobacco products by children and pets. Death usually results in a few minutes from respiratory failure (when a person stops breathing) caused by paralysis. TREATMENT   Medication. Nicotine replacement medicines such as nicotine gum and the patch are used to stop smoking. These medicines gradually lower the dosage of nicotine in the body. These medicines do not contain the carbon monoxide and other toxins found in tobacco smoke.  Hypnotherapy.  Relaxation therapy.  Nicotine Anonymous (a 12-step support   program). Find times and locations in your local yellow pages. Document Released: 04/01/2004 Document Revised: 10/19/2011 Document  Reviewed: 09/22/2013 [employer] Patient Information 2015 [employer]. This information is not intended to replace advice given to you by your health care provider. Make sure you discuss any questions you have with your health care provider.  

## 2015-04-26 LAB — COMPREHENSIVE METABOLIC PANEL
ALK PHOS: 103 IU/L (ref 39–117)
ALT: 12 IU/L (ref 0–32)
AST: 11 IU/L (ref 0–40)
Albumin/Globulin Ratio: 1.4 (ref 1.1–2.5)
Albumin: 4 g/dL (ref 3.5–5.5)
BUN/Creatinine Ratio: 13 (ref 9–23)
BUN: 10 mg/dL (ref 6–24)
Bilirubin Total: 0.3 mg/dL (ref 0.0–1.2)
CO2: 25 mmol/L (ref 18–29)
CREATININE: 0.8 mg/dL (ref 0.57–1.00)
Calcium: 10.3 mg/dL — ABNORMAL HIGH (ref 8.7–10.2)
Chloride: 103 mmol/L (ref 97–108)
GFR calc Af Amer: 93 mL/min/{1.73_m2} (ref >59)
GFR calc non Af Amer: 81 mL/min/{1.73_m2} (ref >59)
GLUCOSE: 84 mg/dL (ref 65–99)
Globulin, Total: 2.8 g/dL (ref 1.5–4.5)
Potassium: 4.9 mmol/L (ref 3.5–5.2)
SODIUM: 142 mmol/L (ref 134–144)
Total Protein: 6.8 g/dL (ref 6.0–8.5)

## 2015-04-26 LAB — LIPID PANEL
Chol/HDL Ratio: 3.7 ratio units (ref 0.0–4.4)
Cholesterol, Total: 150 mg/dL (ref 100–199)
HDL: 41 mg/dL (ref >39)
LDL Calculated: 84 mg/dL (ref 0–99)
Triglycerides: 123 mg/dL (ref 0–149)
VLDL CHOLESTEROL CAL: 25 mg/dL (ref 5–40)

## 2015-04-26 LAB — HEMOGLOBIN A1C
ESTIMATED AVERAGE GLUCOSE: 126 mg/dL
Hgb A1c MFr Bld: 6 % — ABNORMAL HIGH (ref 4.8–5.6)

## 2015-04-26 LAB — VITAMIN D 25 HYDROXY (VIT D DEFICIENCY, FRACTURES): VIT D 25 HYDROXY: 40.6 ng/mL (ref 30.0–100.0)

## 2015-04-26 LAB — TSH: TSH: 26.45 u[IU]/mL — AB (ref 0.450–4.500)

## 2015-04-26 LAB — HIV ANTIBODY (ROUTINE TESTING W REFLEX): HIV SCREEN 4TH GENERATION: NONREACTIVE

## 2015-04-29 ENCOUNTER — Other Ambulatory Visit: Payer: Self-pay

## 2015-04-29 DIAGNOSIS — E038 Other specified hypothyroidism: Secondary | ICD-10-CM

## 2015-04-29 MED ORDER — LEVOTHYROXINE SODIUM 125 MCG PO TABS: 125.0000 ug | ORAL_TABLET | Freq: Every day | ORAL | Status: DC

## 2015-04-29 NOTE — Telephone Encounter (Signed)
Pt states taking thyroid med daily please change dose to 145mcg

## 2015-04-29 NOTE — Telephone Encounter (Signed)
Done, she will need repeat labs in 6 weeks

## 2015-04-29 NOTE — Progress Notes (Signed)
Pt.notified

## 2015-04-30 NOTE — Telephone Encounter (Signed)
To call back for

## 2015-04-30 NOTE — Telephone Encounter (Signed)
Patient will come by in 6 weeks and get blood work redrawn.

## 2015-06-03 ENCOUNTER — Other Ambulatory Visit: Payer: Self-pay | Admitting: Family Medicine

## 2015-06-03 DIAGNOSIS — Z1231 Encounter for screening mammogram for malignant neoplasm of breast: Secondary | ICD-10-CM

## 2015-06-10 ENCOUNTER — Ambulatory Visit
Admission: RE | Admit: 2015-06-10 | Discharge: 2015-06-10 | Disposition: A | Discharge status: Home / Self Care | Payer: BLUE CROSS/BLUE SHIELD | Source: Ambulatory Visit | Attending: Family Medicine | Admitting: Family Medicine

## 2015-06-10 DIAGNOSIS — Z1231 Encounter for screening mammogram for malignant neoplasm of breast: Secondary | ICD-10-CM | POA: Insufficient documentation

## 2015-07-17 ENCOUNTER — Other Ambulatory Visit: Payer: Self-pay | Admitting: Family Medicine

## 2015-07-26 ENCOUNTER — Ambulatory Visit (INDEPENDENT_AMBULATORY_CARE_PROVIDER_SITE_OTHER): Payer: BLUE CROSS/BLUE SHIELD | Admitting: Family Medicine

## 2015-07-26 ENCOUNTER — Encounter: Payer: Self-pay | Admitting: Family Medicine

## 2015-07-26 VITALS — BP 126/78 | HR 67 | Temp 97.6°F | Resp 16 | Ht 67.0 in | Wt 268.4 lb

## 2015-07-26 DIAGNOSIS — M170 Bilateral primary osteoarthritis of knee: Secondary | ICD-10-CM | POA: Diagnosis not present

## 2015-07-26 DIAGNOSIS — E038 Other specified hypothyroidism: Secondary | ICD-10-CM

## 2015-07-26 DIAGNOSIS — E13319 Other specified diabetes mellitus with unspecified diabetic retinopathy without macular edema: Secondary | ICD-10-CM

## 2015-07-26 DIAGNOSIS — I10 Essential (primary) hypertension: Secondary | ICD-10-CM | POA: Diagnosis not present

## 2015-07-26 DIAGNOSIS — E559 Vitamin D deficiency, unspecified: Secondary | ICD-10-CM | POA: Diagnosis not present

## 2015-07-26 DIAGNOSIS — E785 Hyperlipidemia, unspecified: Secondary | ICD-10-CM | POA: Diagnosis not present

## 2015-07-26 DIAGNOSIS — E1140 Type 2 diabetes mellitus with diabetic neuropathy, unspecified: Secondary | ICD-10-CM | POA: Diagnosis not present

## 2015-07-26 DIAGNOSIS — E133299 Other specified diabetes mellitus with mild nonproliferative diabetic retinopathy without macular edema, unspecified eye: Secondary | ICD-10-CM

## 2015-07-26 DIAGNOSIS — H35039 Hypertensive retinopathy, unspecified eye: Secondary | ICD-10-CM

## 2015-07-26 MED ORDER — GABAPENTIN 100 MG PO CAPS: 100.0000 mg | ORAL_CAPSULE | Freq: Three times a day (TID) | ORAL | Status: DC

## 2015-07-26 MED ORDER — METFORMIN HCL 500 MG PO TABS: 500.0000 mg | ORAL_TABLET | Freq: Every day | ORAL | Status: DC

## 2015-07-26 MED ORDER — TRAMADOL HCL 50 MG PO TABS: 50.0000 mg | ORAL_TABLET | Freq: Two times a day (BID) | ORAL | Status: DC | PRN

## 2015-07-26 MED ORDER — MELOXICAM 7.5 MG PO TABS: 7.5000 mg | ORAL_TABLET | Freq: Every day | ORAL | Status: DC

## 2015-07-26 MED ORDER — METOPROLOL SUCCINATE ER 200 MG PO TB24: 200.0000 mg | ORAL_TABLET | Freq: Every day | ORAL | Status: DC

## 2015-07-26 MED ORDER — AMLODIPINE BESY-BENAZEPRIL HCL 5-40 MG PO CAPS: 1.0000 | ORAL_CAPSULE | Freq: Every day | ORAL | Status: DC

## 2015-07-26 MED ORDER — PRAVASTATIN SODIUM 40 MG PO TABS: 40.0000 mg | ORAL_TABLET | Freq: Every day | ORAL | Status: DC

## 2015-07-26 NOTE — Progress Notes (Signed)
Name: Charlene Hardy   MRN: [id number]    DOB: [birth year]   Date:07/26/2015       Progress Note  Subjective  Chief Complaint  Chief Complaint  Patient presents with  . Medication Refill    follow-up  . Diabetes  . Hypertension  . Hyperlipidemia  . Hypothyroidism    dry skin  . Constipation  . Foot Pain    onset 2 months, patient states does alot of standing    HPI  HTN: taking medication as prescribed, bp is at goal, denies side effects. No chest pain or palpitation  Hyperlipidemia: she has been taking Pravastatin denies muscles aches. Compliant with medication. No SOB  Hypothyroidism: taking medication as prescribed, she did not have repeat labs done, constipation is controlled, she has chronic dry skin. No dysphagia.  DMII: taking Metformin, she has diabetic retinopahty, also peripheral neuropathy - only lack of sensation / denies pain. She is compliant with medication, denies polyphagia, polyuria or polydipsia. She is up to date with eye exam and we will request records.   Foot pain: she has peripheral neuropathy, daily feet pain, and also left ankle pain. Recently having symptoms of right achilles tendinitis. She has never seen podiatrist. Works standing up on concrete floor. Advised to wear tennis shoes. We will try Tramadol and Gabapentin to see if it improves symptoms but if no improvement we will refer her to Podiatrist next visit. Left ankle pain is not as severe now. Pain is described as aching, no burning or tingling.   Obesity: weight is stable, but gained a couple of lbs since last visit. She drinks [place] drinks daily , needs to drink water instead, she has pain and is difficulty to exercise.   OA both knees: taking Meloxicam and symptoms are much better controlled.    Patient Active Problem List   Diagnosis Date Noted  . Left ankle pain 04/25/2015  . Constipation 04/25/2015  . Allergic rhinitis 01/20/2015  . 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/2015  .  Complication of diabetes mellitus ([place]) 01/20/2015  . Diabetic sensorimotor neuropathy ([place]) 01/20/2015  . Dyslipidemia 01/20/2015  . Essential (primary) hypertension 01/20/2015  . H/O iron deficiency anemia 01/20/2015  . Hypertensive retinopathy 01/20/2015  . Adult hypothyroidism 01/20/2015  . Extreme obesity ([place]) 01/20/2015  . Background retinopathy due to secondary diabetes ([place]) 01/20/2015  . Tinea pedis 01/20/2015  . Vitamin D deficiency 02/26/2010  . Cigarette smoker 03/06/2008    Past Surgical History  Procedure Laterality Date  . Abdominal hysterectomy  08/11/1999    still has ovaries    Family History  Problem Relation Age of Onset  . Multiple sclerosis Mother   . Diabetes Father   . Breast cancer Sister     Social History   Social History  . Marital Status: Single    Spouse Name: N/A  . Number of Children: N/A  . Years of Education: N/A   Occupational History  . Not on file.   Social History Main Topics  . Smoking status: Current Every Day Smoker -- 1.00 packs/day for 37 years    Types: Cigarettes    Start date: 08/10/1976  . Smokeless tobacco: Never Used  . Alcohol Use: No  . Drug Use: No  . Sexual Activity: Not Currently   Other Topics Concern  . Not on file   Social History Narrative     Current outpatient prescriptions:  .  acetaminophen (TYLENOL) 500 MG tablet, Take 1 tablet (500 mg total) by mouth 2 (two)  times daily., Disp: 30 tablet, Rfl: 0 .  amLODipine-benazepril (LOTREL) 5-40 MG capsule, Take 1 capsule by mouth daily., Disp: 90 capsule, Rfl: 1 .  aspirin 81 MG tablet, Take 1 tablet by mouth daily., Disp: , Rfl:  .  Cholecalciferol (VITAMIN D) 2000 UNITS tablet, Take 1 tablet by mouth daily., Disp: , Rfl:  .  ferrous sulfate 325 (65 FE) MG tablet, Take 1 tablet by mouth daily., Disp: , Rfl:  .  gabapentin (NEURONTIN) 100 MG capsule, Take 1 capsule (100 mg total) by mouth 3 (three) times daily. Gradually go up , to a goal of 300 mg three  times daily, Disp: 100 capsule, Rfl: 0 .  levothyroxine (SYNTHROID, LEVOTHROID) 125 MCG tablet, TAKE ONE TABLET BY MOUTH ONCE DAILY, Disp: 30 tablet, Rfl: 0 .  meloxicam (MOBIC) 7.5 MG tablet, Take 1 tablet (7.5 mg total) by mouth daily., Disp: 90 tablet, Rfl: 1 .  metFORMIN (GLUCOPHAGE) 500 MG tablet, Take 1 tablet (500 mg total) by mouth daily., Disp: 90 tablet, Rfl: 1 .  metoprolol (TOPROL-XL) 200 MG 24 hr tablet, Take 1 tablet (200 mg total) by mouth at bedtime., Disp: 90 tablet, Rfl: 1 .  MULTIPLE VITAMINS-MINERALS ER PO, Take 1 tablet by mouth daily., Disp: , Rfl:  .  Naftifine HCl 2 % CREA, Apply 1 g topically 2 (two) times daily., Disp: 60 g, Rfl: 1 .  omega-3 fish oil (MAXEPA) 1000 MG CAPS capsule, Take 1 capsule by mouth daily., Disp: , Rfl:  .  Polyethylene Glycol 3350 POWD, Take 1 Dose by mouth as needed., Disp: , Rfl:  .  pravastatin (PRAVACHOL) 40 MG tablet, Take 1 tablet (40 mg total) by mouth daily., Disp: 90 tablet, Rfl: 1 .  traMADol (ULTRAM) 50 MG tablet, Take 1 tablet (50 mg total) by mouth every 12 (twelve) hours as needed., Disp: 60 tablet, Rfl: 0 .  TUDORZA PRESSAIR 400 MCG/ACT AEPB, INHALE ONE PUFF BY MOUTH TWICE DAILY, Disp: 1 each, Rfl: 0  Allergies  Allergen Reactions  . Hydrochlorothiazide     hyperparathyroidism  . Pneumovax [Pneumococcal Polysaccharide Vaccine]      ROS  Constitutional: Negative for fever or weight change.  Respiratory: Negative for cough and shortness of breath.   Cardiovascular: Negative for chest pain or palpitations.  Gastrointestinal: Negative for abdominal pain, no  bowel changes.  Musculoskeletal: Negative for gait problem or joint swelling.  Skin: Negative for rash.  Neurological: Negative for dizziness or headache.  No other specific complaints in a complete review of systems (except as listed in HPI above). Objective  Filed Vitals:   07/26/15 0955  BP: 126/78  Pulse: 67  Temp: 97.6 F (36.4 C)  TempSrc: Oral  Resp: 16   Height: 5\' 7"  (1.702 m)  Weight: 268 lb 6.4 oz (121.745 kg)  SpO2: 98%    Body mass index is 42.03 kg/(m^2).  Physical Exam  Constitutional: Patient appears well-developed and well-nourished. Obese  No distress.  HEENT: head atraumatic, normocephalic, pupils equal and reactive to light,  neck supple, throat within normal limits Cardiovascular: Normal rate, regular rhythm and normal heart sounds.  No murmur heard. No BLE edema. Pulmonary/Chest: Effort normal and breath sounds normal. No respiratory distress. Abdominal: Soft.  There is no tenderness. Psychiatric: Patient has a normal mood and affect. behavior is normal. Judgment and thought content normal. Muscular Skeletal: crepitus with extension of both knees, pain during palpation of achilles tendon on right side, normal rom of both ankles, no point tenderness on  feet.   PHQ2/9: Depression screen [facility] 2/9 07/26/2015 04/25/2015 01/21/2015  Decreased Interest 0 0 0  Down, Depressed, Hopeless 0 0 0  PHQ - 2 Score 0 0 0     Fall Risk: Fall Risk  07/26/2015 04/25/2015 01/21/2015  Falls in the past year? No No No    Functional Status Survey: Is the patient deaf or have difficulty hearing?: No Does the patient have difficulty seeing, even when wearing glasses/contacts?: Yes (glasses) Does the patient have difficulty concentrating, remembering, or making decisions?: No Does the patient have difficulty walking or climbing stairs?: No Does the patient have difficulty dressing or bathing?: No Does the patient have difficulty doing errands alone such as visiting a doctor's office or shopping?: No    Assessment & Plan  1. Type 2 diabetes, controlled, with neuropathy (HCC)  - POCT HgB A1C - metFORMIN (GLUCOPHAGE) 500 MG tablet; Take 1 tablet (500 mg total) by mouth daily.  Dispense: 90 tablet; Refill: 1 - gabapentin (NEURONTIN) 100 MG capsule; Take 1 capsule (100 mg total) by mouth 3 (three) times daily. Gradually go up , to a goal of  300 mg three times daily  Dispense: 100 capsule; Refill: 0 - traMADol (ULTRAM) 50 MG tablet; Take 1 tablet (50 mg total) by mouth every 12 (twelve) hours as needed.  Dispense: 60 tablet; Refill: 0  2. Dyslipidemia  - pravastatin (PRAVACHOL) 40 MG tablet; Take 1 tablet (40 mg total) by mouth daily.  Dispense: 90 tablet; Refill: 1  3. Essential (primary) hypertension  Continue bp medication  4. Vitamin D deficiency  Continue supplementation  5. Morbid obesity, unspecified obesity type [facility])  Discussed with the patient the risk posed by an increased BMI. Discussed importance of portion control, calorie counting and at least 150 minutes of physical activity weekly. Avoid sweet beverages and drink more water. Eat at least 6 servings of fruit and vegetables daily   6. Other specified hypothyroidism  - TSH  7. Background retinopathy due to secondary diabetes [facility])  Continue follow up with eye doctor  8. Hypertensive retinopathy, unspecified laterality  - amLODipine-benazepril (LOTREL) 5-40 MG capsule; Take 1 capsule by mouth daily.  Dispense: 90 capsule; Refill: 1 - metoprolol (TOPROL-XL) 200 MG 24 hr tablet; Take 1 tablet (200 mg total) by mouth at bedtime.  Dispense: 90 tablet; Refill: 1  9. Knee pain, OA  - meloxicam (MOBIC) 7.5 MG tablet; Take 1 tablet (7.5 mg total) by mouth daily.  Dispense: 90 tablet; Refill: 1  10. Hypercalcemia  - Calcium, ionized

## 2015-07-27 LAB — CALCIUM, IONIZED: CALCIUM ION: 5.8 mg/dL — AB (ref 4.5–5.6)

## 2015-07-27 LAB — TSH: TSH: 24.72 u[IU]/mL — ABNORMAL HIGH (ref 0.450–4.500)

## 2015-07-29 ENCOUNTER — Other Ambulatory Visit: Payer: Self-pay | Admitting: Family Medicine

## 2015-07-29 ENCOUNTER — Telehealth: Payer: Self-pay

## 2015-07-29 MED ORDER — LEVOTHYROXINE SODIUM 150 MCG PO TABS: 150.0000 ug | ORAL_TABLET | Freq: Every day | ORAL | Status: DC

## 2015-07-29 NOTE — Telephone Encounter (Signed)
-----   Message from Steele Sizer, MD sent at 07/29/2015  8:10 AM EST ----- She still has hypercalcemia TSH is not at goal, I will adjust dose, but she needs to see Endo, for hypercalcemia, please verify if she was seen at [facility] in the past

## 2015-07-29 NOTE — Telephone Encounter (Signed)
Left vm for patient to return my call regarding labs.   

## 2015-07-30 ENCOUNTER — Other Ambulatory Visit: Payer: Self-pay

## 2015-08-11 HISTORY — PX: COLONOSCOPY: SHX174

## 2015-08-27 ENCOUNTER — Ambulatory Visit (INDEPENDENT_AMBULATORY_CARE_PROVIDER_SITE_OTHER): Payer: BLUE CROSS/BLUE SHIELD | Admitting: Family Medicine

## 2015-08-27 ENCOUNTER — Other Ambulatory Visit: Payer: Self-pay | Admitting: Family Medicine

## 2015-08-27 ENCOUNTER — Encounter: Payer: Self-pay | Admitting: Family Medicine

## 2015-08-27 VITALS — BP 132/78 | HR 75 | Temp 97.7°F | Resp 16 | Wt 275.4 lb

## 2015-08-27 DIAGNOSIS — E038 Other specified hypothyroidism: Secondary | ICD-10-CM

## 2015-08-27 DIAGNOSIS — E1140 Type 2 diabetes mellitus with diabetic neuropathy, unspecified: Secondary | ICD-10-CM

## 2015-08-27 DIAGNOSIS — E1142 Type 2 diabetes mellitus with diabetic polyneuropathy: Secondary | ICD-10-CM

## 2015-08-27 MED ORDER — GABAPENTIN 300 MG PO CAPS: 300.0000 mg | ORAL_CAPSULE | Freq: Three times a day (TID) | ORAL | Status: DC

## 2015-08-27 MED ORDER — LEVOTHYROXINE SODIUM 150 MCG PO TABS: 150.0000 ug | ORAL_TABLET | Freq: Every day | ORAL | Status: DC

## 2015-08-27 NOTE — Progress Notes (Signed)
Name: Charlene Hardy   MRN: [id number]    DOB: 1955/10/13   Date:08/27/2015       Progress Note  Subjective  Chief Complaint  Chief Complaint  Patient presents with  . Foot Pain    patient is here for a follow-up to with neuropathy  . Referral    podiatry & needs a new referral to [facility]     HPI  Hypothyroidism: taking medication as prescribed, TSH was very high on her past two labs and we have adjusted the dose to 125 mcg daily. She was given a referral to Dr. Gabriel Carina, for hypothyroidism and hypercalcemia. She states she takes the medication by itself with water with an empty stomach.  She has noticed weight gain, hair if breaking off,  she has chronic dry skin. No dysphagia.  DMII: taking Metformin, last eye exam no diabetic retinopathy, she has peripheral neuropathy - only lack of sensation - severe bilateral foot pain. She is compliant with medication, denies polyphagia, polyuria or polydipsia. She is up to date with eye exam.  Foot pain: she has peripheral neuropathy, daily feet pain, pain is severe when she wakes up 10/10. She takes gabapetin currently at 300 mg in am's - and pain goes down to 5/10.Marland Kitchen  She has never seen podiatrist. Works standing up on concrete floor.  Tramadol did not work.  . Pain is described as aching, no burning or tingling. She did not titrate the dose of gabapentin to three times daily and we will try increasing dose nad make referral to Podiatrist.   Patient Active Problem List   Diagnosis Date Noted  . Left ankle pain 04/25/2015  . Constipation 04/25/2015  . Allergic rhinitis 01/20/2015  . 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/2015  . Complication of diabetes mellitus ([place]) 01/20/2015  . Diabetic sensorimotor neuropathy ([place]) 01/20/2015  . Dyslipidemia 01/20/2015  . Essential (primary) hypertension 01/20/2015  . H/O iron deficiency anemia 01/20/2015  . Hypertensive retinopathy 01/20/2015  . Adult hypothyroidism 01/20/2015  . Extreme obesity ([place]) 01/20/2015  .  Background retinopathy due to secondary diabetes ([place]) 01/20/2015  . Tinea pedis 01/20/2015  . Vitamin D deficiency 02/26/2010  . Cigarette smoker 03/06/2008    Past Surgical History  Procedure Laterality Date  . Abdominal hysterectomy  08/11/1999    still has ovaries    Family History  Problem Relation Age of Onset  . Multiple sclerosis Mother   . Diabetes Father   . Breast cancer Sister     Social History   Social History  . Marital Status: Single    Spouse Name: N/A  . Number of Children: N/A  . Years of Education: N/A   Occupational History  . Not on file.   Social History Main Topics  . Smoking status: Current Every Day Smoker -- 1.00 packs/day for 37 years    Types: Cigarettes    Start date: 08/10/1976  . Smokeless tobacco: Never Used  . Alcohol Use: No  . Drug Use: No  . Sexual Activity: Not Currently   Other Topics Concern  . Not on file   Social History Narrative     Current outpatient prescriptions:  .  acetaminophen (TYLENOL) 500 MG tablet, Take 1 tablet (500 mg total) by mouth 2 (two) times daily., Disp: 30 tablet, Rfl: 0 .  amLODipine-benazepril (LOTREL) 5-40 MG capsule, Take 1 capsule by mouth daily., Disp: 90 capsule, Rfl: 1 .  aspirin 81 MG tablet, Take 1 tablet by mouth daily., Disp: , Rfl:  .  Cholecalciferol (  VITAMIN D) 2000 UNITS tablet, Take 1 tablet by mouth daily., Disp: , Rfl:  .  ferrous sulfate 325 (65 FE) MG tablet, Take 1 tablet by mouth daily., Disp: , Rfl:  .  gabapentin (NEURONTIN) 300 MG capsule, Take 1 capsule (300 mg total) by mouth 3 (three) times daily. Gradually go up , to a goal of 300 mg three times daily, Disp: 90 capsule, Rfl: 1 .  levothyroxine (SYNTHROID, LEVOTHROID) 125 MCG tablet, , Disp: , Rfl: 0 .  meloxicam (MOBIC) 7.5 MG tablet, Take 1 tablet (7.5 mg total) by mouth daily., Disp: 90 tablet, Rfl: 1 .  metFORMIN (GLUCOPHAGE) 500 MG tablet, Take 1 tablet (500 mg total) by mouth daily., Disp: 90 tablet, Rfl: 1 .   metoprolol (TOPROL-XL) 200 MG 24 hr tablet, Take 1 tablet (200 mg total) by mouth at bedtime., Disp: 90 tablet, Rfl: 1 .  MULTIPLE VITAMINS-MINERALS ER PO, Take 1 tablet by mouth daily., Disp: , Rfl:  .  Naftifine HCl 2 % CREA, Apply 1 g topically 2 (two) times daily., Disp: 60 g, Rfl: 1 .  omega-3 fish oil (MAXEPA) 1000 MG CAPS capsule, Take 1 capsule by mouth daily., Disp: , Rfl:  .  Polyethylene Glycol 3350 POWD, Take 1 Dose by mouth as needed., Disp: , Rfl:  .  pravastatin (PRAVACHOL) 40 MG tablet, Take 1 tablet (40 mg total) by mouth daily., Disp: 90 tablet, Rfl: 1 .  TUDORZA PRESSAIR 400 MCG/ACT AEPB, INHALE ONE PUFF BY MOUTH TWICE DAILY, Disp: 1 each, Rfl: 0  Allergies  Allergen Reactions  . Hydrochlorothiazide     hyperparathyroidism  . Pneumovax [Pneumococcal Polysaccharide Vaccine]      ROS  Ten systems reviewed and is negative except as mentioned in HPI   Objective  Filed Vitals:   08/27/15 1043  BP: 132/78  Pulse: 75  Temp: 97.7 F (36.5 C)  TempSrc: Oral  Resp: 16  Weight: 275 lb 6.4 oz (124.921 kg)  SpO2: 98%    Body mass index is 43.12 kg/(m^2).  Physical Exam  Constitutional: Patient appears well-developed and well-nourished. Obese  No distress.  HEENT: head atraumatic, normocephalic, pupils equal and reactive to light, neck supple, throat within normal limits Cardiovascular: Normal rate, regular rhythm and normal heart sounds.  No murmur heard. No BLE edema. Pulmonary/Chest: Effort normal and breath sounds normal. No respiratory distress. Abdominal: Soft.  There is no tenderness. Psychiatric: Patient has a normal mood and affect. behavior is normal. Judgment and thought content normal. Muscular Skeletal: pes planus, no pain during palpation of either feet  Recent Results (from the past 2160 hour(s))  TSH     Status: Abnormal   Collection Time: 07/26/15 11:07 AM  Result Value Ref Range   TSH 24.720 (H) 0.450 - 4.500 uIU/mL  Calcium, ionized      Status: Abnormal   Collection Time: 07/26/15 11:07 AM  Result Value Ref Range   Calcium, Ion 5.8 (H) 4.5 - 5.6 mg/dL     PHQ2/9: Depression screen [facility] 2/9 08/27/2015 07/26/2015 04/25/2015 01/21/2015  Decreased Interest 0 0 0 0  Down, Depressed, Hopeless 0 0 0 0  PHQ - 2 Score 0 0 0 0    Fall Risk: Fall Risk  08/27/2015 07/26/2015 04/25/2015 01/21/2015  Falls in the past year? No No No No    Functional Status Survey: Is the patient deaf or have difficulty hearing?: No Does the patient have difficulty seeing, even when wearing glasses/contacts?: Yes (glasses) Does the patient have difficulty  concentrating, remembering, or making decisions?: No Does the patient have difficulty walking or climbing stairs?: No Does the patient have difficulty dressing or bathing?: No Does the patient have difficulty doing errands alone such as visiting a doctor's office or shopping?: No    Assessment & Plan  1. Diabetic sensorimotor neuropathy (HCC)  Increase dose of gabapentin to three times daily, titrate slowly, she still has 100 mg capsules at home - gabapentin (NEURONTIN) 300 MG capsule; Take 1 capsule (300 mg total) by mouth 3 (three) times daily. Gradually go up , to a goal of 300 mg three times daily  Dispense: 90 capsule; Refill: 1 - Podiatrist referral  2. Type 2 diabetes, controlled, with neuropathy (HCC)  - gabapentin (NEURONTIN) 300 MG capsule; Take 1 capsule (300 mg total) by mouth 3 (three) times daily. Gradually go up , to a goal of 300 mg three times daily  Dispense: 90 capsule; Refill: 1  3. Other specified hypothyroidism  She did not get an appointment with Endo, we will contact Dr. Joycie Peek office

## 2015-09-02 ENCOUNTER — Encounter: Payer: Self-pay | Admitting: Family Medicine

## 2015-09-13 ENCOUNTER — Encounter: Payer: Self-pay | Admitting: Sports Medicine

## 2015-09-13 ENCOUNTER — Ambulatory Visit (INDEPENDENT_AMBULATORY_CARE_PROVIDER_SITE_OTHER): Payer: BLUE CROSS/BLUE SHIELD | Admitting: Sports Medicine

## 2015-09-13 VITALS — BP 72/48 | HR 77 | Resp 16

## 2015-09-13 DIAGNOSIS — B353 Tinea pedis: Secondary | ICD-10-CM

## 2015-09-13 DIAGNOSIS — M79672 Pain in left foot: Secondary | ICD-10-CM | POA: Diagnosis not present

## 2015-09-13 DIAGNOSIS — M2040 Other hammer toe(s) (acquired), unspecified foot: Secondary | ICD-10-CM | POA: Diagnosis not present

## 2015-09-13 DIAGNOSIS — E1142 Type 2 diabetes mellitus with diabetic polyneuropathy: Secondary | ICD-10-CM

## 2015-09-13 DIAGNOSIS — L84 Corns and callosities: Secondary | ICD-10-CM

## 2015-09-13 DIAGNOSIS — B351 Tinea unguium: Secondary | ICD-10-CM | POA: Diagnosis not present

## 2015-09-13 DIAGNOSIS — M79671 Pain in right foot: Secondary | ICD-10-CM | POA: Diagnosis not present

## 2015-09-13 NOTE — Progress Notes (Deleted)
   Subjective:    Patient ID: Charlene Hardy, female    DOB: August 07, 1956, 60 y.o.   MRN: [id number]  HPI    Review of Systems  All other systems reviewed and are negative.      Objective:   Physical Exam        Assessment & Plan:

## 2015-09-13 NOTE — Progress Notes (Signed)
Patient ID: Charlene Hardy, female   DOB: 27-Nov-1955, 60 y.o.   MRN: [id number] Subjective: Charlene Hardy is a 60 y.o. female patient with history of type 2 diabetes who presents to office today complaining of neuropathic pain and long, painful nails  while ambulating in shoes; unable to trim. Patient states that the glucose reading this morning was not recorded. Admits to ankle swelling and hasn't been wearing compression stockings. Patient denies any new changes in medication or new problems. Patient denies any new cramping, numbness, burning or tingling in the legs.  Review of Systems  All other systems reviewed and are negative.  Patient Active Problem List   Diagnosis Date Noted  . Left ankle pain 04/25/2015  . Constipation 04/25/2015  . Allergic rhinitis 01/20/2015  . 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/2015  . Complication of diabetes mellitus ([place]) 01/20/2015  . Diabetic sensorimotor neuropathy ([place]) 01/20/2015  . Dyslipidemia 01/20/2015  . Essential (primary) hypertension 01/20/2015  . H/O iron deficiency anemia 01/20/2015  . Hypertensive retinopathy 01/20/2015  . Adult hypothyroidism 01/20/2015  . Extreme obesity ([place]) 01/20/2015  . Background retinopathy due to secondary diabetes ([place]) 01/20/2015  . Tinea pedis 01/20/2015  . Vitamin D deficiency 02/26/2010  . Cigarette smoker 03/06/2008   Current Outpatient Prescriptions on File Prior to Visit  Medication Sig Dispense Refill  . acetaminophen (TYLENOL) 500 MG tablet Take 1 tablet (500 mg total) by mouth 2 (two) times daily. 30 tablet 0  . amLODipine-benazepril (LOTREL) 5-40 MG capsule Take 1 capsule by mouth daily. 90 capsule 1  . aspirin 81 MG tablet Take 1 tablet by mouth daily.    . Cholecalciferol (VITAMIN D) 2000 UNITS tablet Take 1 tablet by mouth daily.    . ferrous sulfate 325 (65 FE) MG tablet Take 1 tablet by mouth daily.    Marland Kitchen gabapentin (NEURONTIN) 300 MG capsule Take 1 capsule (300 mg total) by mouth 3 (three) times  daily. Gradually go up , to a goal of 300 mg three times daily 90 capsule 1  . levothyroxine (SYNTHROID, LEVOTHROID) 150 MCG tablet Take 1 tablet (150 mcg total) by mouth daily before breakfast. 30 tablet 0  . meloxicam (MOBIC) 7.5 MG tablet Take 1 tablet (7.5 mg total) by mouth daily. 90 tablet 1  . metFORMIN (GLUCOPHAGE) 500 MG tablet Take 1 tablet (500 mg total) by mouth daily. 90 tablet 1  . metoprolol (TOPROL-XL) 200 MG 24 hr tablet Take 1 tablet (200 mg total) by mouth at bedtime. 90 tablet 1  . MULTIPLE VITAMINS-MINERALS ER PO Take 1 tablet by mouth daily.    . Naftifine HCl 2 % CREA Apply 1 g topically 2 (two) times daily. 60 g 1  . omega-3 fish oil (MAXEPA) 1000 MG CAPS capsule Take 1 capsule by mouth daily.    . Polyethylene Glycol 3350 POWD Take 1 Dose by mouth as needed.    . pravastatin (PRAVACHOL) 40 MG tablet Take 1 tablet (40 mg total) by mouth daily. 90 tablet 1  . TUDORZA PRESSAIR 400 MCG/ACT AEPB INHALE ONE PUFF BY MOUTH TWICE DAILY 1 each 0   No current facility-administered medications on file prior to visit.   Allergies  Allergen Reactions  . Hydrochlorothiazide     hyperparathyroidism  . Pneumococcal Vaccine Rash  . Pneumovax [Pneumococcal Polysaccharide Vaccine]     Objective: General: Patient is awake, alert, and oriented x 3 and in no acute distress.  Integument: Skin is warm, dry and supple bilateral. Nails are tender, long, thickened and  dystrophic with subungual debris, consistent with onychomycosis, 1-5 bilateral. + interdgital maceration consistent with tinea. No signs of infection. No open lesions or preulcerative lesions present bilateral; hx of callus in the past on big toes bilateral. Remaining integument unremarkable.  Vasculature:  Dorsalis Pedis pulse 1/4 bilateral. Posterior Tibial pulse  1/4 bilateral.  Capillary fill time <3 sec 1-5 bilateral. Scant hair growth to the level of the digits. Temperature gradient within normal limits. No  varicosities present bilateral. Mild pitting trace edema present bilateral ankles.   Neurology: The patient has absent sensation measured with a 5.07/10g Semmes Weinstein Monofilament at all pedal sites bilateral . Vibratory sensation severely diminished bilateral with tuning fork. No Babinski sign present bilateral.   Musculoskeletal: + hammertoe deformities noted bilateral. Muscular strength 5/5 in all lower extremity muscular groups bilateral without pain or limitation on range of motion . No tenderness with calf compression bilateral.  Assessment and Plan: Problem List Items Addressed This Visit      Musculoskeletal and Integument   Tinea pedis    Other Visit Diagnoses    Dermatophytosis of nail    -  Primary    Diabetic polyneuropathy associated with type 2 diabetes mellitus (HCC)        Hammer toe, unspecified laterality        Callus of foot        history of callus on 1st toe bilateral    Foot pain, bilateral           -Examined patient. -Discussed and educated patient on diabetic foot care, especially with  regards to the vascular, neurological and musculoskeletal systems.  -Stressed the importance of good glycemic control and the detriment of not  controlling glucose levels in relation to the foot. -Mechanically debrided all nails 1-5 bilateral using sterile nail nipper and filed with dremel without incident  -Applied castenalli's paint in between toes and advised patient to dry well daily in between toes -Safe step diabetic shoe order form was completed; office to contact primary care for approval / certification;  Office to arrange shoe fitting and dispensing. -Advised patient to wear compression stockings as Rx -Will continue to monitor neuropathic pain if worsens neurology consult or consider discussion with PCP about increasing or changing Gabapentin  -Answered all patient questions -Patient to return a in 3 months for at risk foot care -Patient advised to call the  office if any problems or questions arise in the  Meantime.  Landis Martins, DPM

## 2015-09-13 NOTE — Patient Instructions (Signed)
Diabetes and Foot Care Diabetes may cause you to have problems because of poor blood supply (circulation) to your feet and legs. This may cause the skin on your feet to become thinner, break easier, and heal more slowly. Your skin may become dry, and the skin may peel and crack. You may also have nerve damage in your legs and feet causing decreased feeling in them. You may not notice minor injuries to your feet that could lead to infections or more serious problems. Taking care of your feet is one of the most important things you can do for yourself.  HOME CARE INSTRUCTIONS  Wear shoes at all times, even in the house. Do not go barefoot. Bare feet are easily injured.  Check your feet daily for blisters, cuts, and redness. If you cannot see the bottom of your feet, use a mirror or ask someone for help.  Wash your feet with warm water (do not use hot water) and mild soap. Then pat your feet and the areas between your toes until they are completely dry. Do not soak your feet as this can dry your skin.  Apply a moisturizing lotion or petroleum jelly (that does not contain alcohol and is unscented) to the skin on your feet and to dry, brittle toenails. Do not apply lotion between your toes.  Trim your toenails straight across. Do not dig under them or around the cuticle. File the edges of your nails with an emery board or nail file.  Do not cut corns or calluses or try to remove them with medicine.  Wear clean socks or stockings every day. Make sure they are not too tight. Do not wear knee-high stockings since they may decrease blood flow to your legs.  Wear shoes that fit properly and have enough cushioning. To break in new shoes, wear them for just a few hours a day. This prevents you from injuring your feet. Always look in your shoes before you put them on to be sure there are no objects inside.  Do not cross your legs. This may decrease the blood flow to your feet.  If you find a minor scrape,  cut, or break in the skin on your feet, keep it and the skin around it clean and dry. These areas may be cleansed with mild soap and water. Do not cleanse the area with peroxide, alcohol, or iodine.  When you remove an adhesive bandage, be sure not to damage the skin around it.  If you have a wound, look at it several times a day to make sure it is healing.  Do not use heating pads or hot water bottles. They may burn your skin. If you have lost feeling in your feet or legs, you may not know it is happening until it is too late.  Make sure your health care provider performs a complete foot exam at least annually or more often if you have foot problems. Report any cuts, sores, or bruises to your health care provider immediately. SEEK MEDICAL CARE IF:   You have an injury that is not healing.  You have cuts or breaks in the skin.  You have an ingrown nail.  You notice redness on your legs or feet.  You feel burning or tingling in your legs or feet.  You have pain or cramps in your legs and feet.  Your legs or feet are numb.  Your feet always feel cold. SEEK IMMEDIATE MEDICAL CARE IF:   There is increasing redness,   swelling, or pain in or around a wound.  There is a red line that goes up your leg.  Pus is coming from a wound.  You develop a fever or as directed by your health care provider.  You notice a bad smell coming from an ulcer or wound.   This information is not intended to replace advice given to you by your health care provider. Make sure you discuss any questions you have with your health care provider.   Document Released: 07/24/2000 Document Revised: 03/29/2013 Document Reviewed: 01/03/2013 Elsevier Interactive Patient Education 2016 [employer]  

## 2015-10-03 ENCOUNTER — Other Ambulatory Visit: Payer: Self-pay | Admitting: Family Medicine

## 2015-10-03 NOTE — Telephone Encounter (Signed)
Patient requesting refill. 

## 2015-10-09 ENCOUNTER — Ambulatory Visit (INDEPENDENT_AMBULATORY_CARE_PROVIDER_SITE_OTHER): Payer: BLUE CROSS/BLUE SHIELD | Admitting: Sports Medicine

## 2015-10-09 DIAGNOSIS — E1142 Type 2 diabetes mellitus with diabetic polyneuropathy: Secondary | ICD-10-CM

## 2015-10-09 DIAGNOSIS — M2040 Other hammer toe(s) (acquired), unspecified foot: Secondary | ICD-10-CM

## 2015-10-09 DIAGNOSIS — L84 Corns and callosities: Secondary | ICD-10-CM

## 2015-10-09 NOTE — Progress Notes (Signed)
Measured for diabetic shoes and insoles.  Patient discussed with medical assistant. Agree with above. Patient to follow up as scheduled for continued care or sooner if problems or issues arise. -Dr. Cannon Kettle

## 2015-10-23 ENCOUNTER — Encounter: Payer: Self-pay | Admitting: Family Medicine

## 2015-10-23 ENCOUNTER — Ambulatory Visit (INDEPENDENT_AMBULATORY_CARE_PROVIDER_SITE_OTHER): Payer: BLUE CROSS/BLUE SHIELD | Admitting: Family Medicine

## 2015-10-23 VITALS — BP 138/74 | HR 69 | Temp 97.8°F | Resp 16 | Ht 67.0 in | Wt 269.5 lb

## 2015-10-23 DIAGNOSIS — M8580 Other specified disorders of bone density and structure, unspecified site: Secondary | ICD-10-CM

## 2015-10-23 DIAGNOSIS — I10 Essential (primary) hypertension: Secondary | ICD-10-CM

## 2015-10-23 DIAGNOSIS — R600 Localized edema: Secondary | ICD-10-CM | POA: Diagnosis not present

## 2015-10-23 DIAGNOSIS — E1142 Type 2 diabetes mellitus with diabetic polyneuropathy: Secondary | ICD-10-CM | POA: Diagnosis not present

## 2015-10-23 DIAGNOSIS — E133299 Other specified diabetes mellitus with mild nonproliferative diabetic retinopathy without macular edema, unspecified eye: Secondary | ICD-10-CM

## 2015-10-23 DIAGNOSIS — M25562 Pain in left knee: Secondary | ICD-10-CM

## 2015-10-23 DIAGNOSIS — E785 Hyperlipidemia, unspecified: Secondary | ICD-10-CM | POA: Diagnosis not present

## 2015-10-23 DIAGNOSIS — F17210 Nicotine dependence, cigarettes, uncomplicated: Secondary | ICD-10-CM

## 2015-10-23 DIAGNOSIS — E1140 Type 2 diabetes mellitus with diabetic neuropathy, unspecified: Secondary | ICD-10-CM | POA: Diagnosis not present

## 2015-10-23 DIAGNOSIS — E038 Other specified hypothyroidism: Secondary | ICD-10-CM | POA: Diagnosis not present

## 2015-10-23 DIAGNOSIS — J42 Unspecified chronic bronchitis: Secondary | ICD-10-CM | POA: Insufficient documentation

## 2015-10-23 DIAGNOSIS — J410 Simple chronic bronchitis: Secondary | ICD-10-CM | POA: Diagnosis not present

## 2015-10-23 DIAGNOSIS — M25561 Pain in right knee: Secondary | ICD-10-CM

## 2015-10-23 DIAGNOSIS — E13319 Other specified diabetes mellitus with unspecified diabetic retinopathy without macular edema: Secondary | ICD-10-CM | POA: Diagnosis not present

## 2015-10-23 DIAGNOSIS — Z720 Tobacco use: Secondary | ICD-10-CM | POA: Diagnosis not present

## 2015-10-23 LAB — POCT GLYCOSYLATED HEMOGLOBIN (HGB A1C): Hemoglobin A1C: 5.8

## 2015-10-23 MED ORDER — IPRATROPIUM-ALBUTEROL 20-100 MCG/ACT IN AERS: 1.0000 | INHALATION_SPRAY | Freq: Four times a day (QID) | RESPIRATORY_TRACT | Status: DC | PRN

## 2015-10-23 MED ORDER — MEDICAL COMPRESSION STOCKINGS MISC: 2.0000 | Freq: Every day | Status: DC

## 2015-10-23 MED ORDER — GABAPENTIN 300 MG PO CAPS: 300.0000 mg | ORAL_CAPSULE | Freq: Three times a day (TID) | ORAL | Status: DC

## 2015-10-23 MED ORDER — TRAMADOL HCL 50 MG PO TABS: 50.0000 mg | ORAL_TABLET | Freq: Three times a day (TID) | ORAL | Status: DC

## 2015-10-23 NOTE — Progress Notes (Signed)
Name: Charlene Hardy   MRN: [id number]    DOB: [birth year]   Date:10/23/2015       Progress Note  Subjective  Chief Complaint  Chief Complaint  Patient presents with  . Diabetes    Increased Gabapentin last visit-pain will varied from day to day, Checks BS twice daily Low-94 Highest-118  . Hyperlipidemia  . Hypertension    Left ankle swelling  . Obesity    Lost 6 pounds since last visit, states she has been busy working  . Hypothyroidism    Dry Skin   . Osteoarthritis    Right knee is bothering her   . Cough    Doing well on inhaler only having to use prn with Tudorza    HPI  Hypothyroidism: taking medication as prescribed, TSH was very high on her past two labs and is now on 150 mcg of Synthroid. She was seen by Dr Gabriel Carina but just for hypercalcemia - bone density showed osteopenia but low FRAX score, not on medication for it. . She states she takes the medication by itself with water with an empty stomach. She has lost some weight since last visit,  hair is growing now but she has chronic dry skin. No dysphagia.  DMII: taking Metformin, last eye exam no diabetic retinopathy, she has peripheral neuropathy - only lack of sensation - severe bilateral foot pain. She is compliant with medication, denies polyphagia, polyuria or polydipsia. She states she has been taking Gabapentin has helped with symptoms. She has been swelling on both lower extremities - she tried otc compression stocking hoses but broke her skin. We will give her a prescription  Obesity: with multiple risk factors, also bilateral knee pain, lost some weight since last visit, discussed life style modificaiton  Bilateral knee arthralgias: switch from left to right intermittent knee pain. We will stop Meloxicam and try Tramadol instead to prevent kidney problems, also discussed X-ray but she would like to hold off and can take Tylenol prn.   Chronic bronchitis: due for spirometry but would like to wait until next visit. She  is still smoking and not ready to quit. She denies SOB but has a daily dry cough. Using Turdoza prn so we will switch to Combivent    Patient Active Problem List   Diagnosis Date Noted  . Arthralgia of both knees 10/23/2015  . Chronic bronchitis ([place]) 10/23/2015  . Left ankle pain 04/25/2015  . Constipation 04/25/2015  . Allergic rhinitis 01/20/2015  . 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/2015  . Complication of diabetes mellitus ([place]) 01/20/2015  . Diabetic sensorimotor neuropathy ([place]) 01/20/2015  . Dyslipidemia 01/20/2015  . Essential (primary) hypertension 01/20/2015  . H/O iron deficiency anemia 01/20/2015  . Hypertensive retinopathy 01/20/2015  . Adult hypothyroidism 01/20/2015  . Extreme obesity ([place]) 01/20/2015  . Background retinopathy due to secondary diabetes ([place]) 01/20/2015  . Tinea pedis 01/20/2015  . Vitamin D deficiency 02/26/2010  . Cigarette smoker 03/06/2008    Past Surgical History  Procedure Laterality Date  . Abdominal hysterectomy  08/11/1999    still has ovaries    Family History  Problem Relation Age of Onset  . Multiple sclerosis Mother   . Diabetes Father   . Breast cancer Sister     Social History   Social History  . Marital Status: Single    Spouse Name: N/A  . Number of Children: N/A  . Years of Education: N/A   Occupational History  . Not on file.   Social History Main Topics  .  Smoking status: Current Every Day Smoker -- 1.00 packs/day for 37 years    Types: Cigarettes    Start date: 08/10/1976  . Smokeless tobacco: Never Used  . Alcohol Use: No  . Drug Use: No  . Sexual Activity: Not Currently   Other Topics Concern  . Not on file   Social History Narrative     Current outpatient prescriptions:  .  acetaminophen (TYLENOL) 500 MG tablet, Take 1 tablet (500 mg total) by mouth 2 (two) times daily., Disp: 30 tablet, Rfl: 0 .  amLODipine-benazepril (LOTREL) 5-40 MG capsule, Take 1 capsule by mouth daily., Disp: 90 capsule, Rfl:  1 .  aspirin 81 MG tablet, Take 1 tablet by mouth daily., Disp: , Rfl:  .  Cholecalciferol (VITAMIN D) 2000 UNITS tablet, Take 1 tablet by mouth daily., Disp: , Rfl:  .  ferrous sulfate 325 (65 FE) MG tablet, Take 1 tablet by mouth daily., Disp: , Rfl:  .  gabapentin (NEURONTIN) 300 MG capsule, Take 1 capsule (300 mg total) by mouth 3 (three) times daily. Gradually go up , to a goal of 300 mg three times daily, Disp: 270 capsule, Rfl: 1 .  levothyroxine (SYNTHROID, LEVOTHROID) 150 MCG tablet, TAKE ONE TABLET BY MOUTH ONCE DAILY, Disp: 30 tablet, Rfl: 0 .  metFORMIN (GLUCOPHAGE) 500 MG tablet, Take 1 tablet (500 mg total) by mouth daily., Disp: 90 tablet, Rfl: 1 .  metoprolol (TOPROL-XL) 200 MG 24 hr tablet, Take 1 tablet (200 mg total) by mouth at bedtime., Disp: 90 tablet, Rfl: 1 .  MULTIPLE VITAMINS-MINERALS ER PO, Take 1 tablet by mouth daily., Disp: , Rfl:  .  Naftifine HCl 2 % CREA, Apply 1 g topically 2 (two) times daily., Disp: 60 g, Rfl: 1 .  omega-3 fish oil (MAXEPA) 1000 MG CAPS capsule, Take 1 capsule by mouth daily., Disp: , Rfl:  .  Polyethylene Glycol 3350 POWD, Take 1 Dose by mouth as needed., Disp: , Rfl:  .  pravastatin (PRAVACHOL) 40 MG tablet, Take 1 tablet (40 mg total) by mouth daily., Disp: 90 tablet, Rfl: 1 .  traMADol (ULTRAM) 50 MG tablet, Take 1 tablet (50 mg total) by mouth 3 (three) times daily., Disp: 90 tablet, Rfl: 2 .  Elastic Bandages & Supports (MEDICAL COMPRESSION STOCKINGS) MISC, 2 each by Does not apply route daily. For 12 hours on and off at night, Disp: 2 each, Rfl: 0 .  Ipratropium-Albuterol (COMBIVENT RESPIMAT) 20-100 MCG/ACT AERS respimat, Inhale 1 puff into the lungs every 6 (six) hours as needed for wheezing., Disp: 1 Inhaler, Rfl: 2  Allergies  Allergen Reactions  . Hydrochlorothiazide     hyperparathyroidism  . Pneumococcal Vaccine Rash  . Pneumovax [Pneumococcal Polysaccharide Vaccine]      ROS  Constitutional: Negative for fever, positive  for weight change.  Respiratory: Positive  for cough but no  shortness of breath.   Cardiovascular: Negative for chest pain or palpitations.  Gastrointestinal: Negative for abdominal pain, no bowel changes.  Musculoskeletal: Negative for gait problem , positive for  joint swelling.  Skin: Negative for rash.  Neurological: Negative for dizziness or headache.  No other specific complaints in a complete review of systems (except as listed in HPI above).  Objective  Filed Vitals:   10/23/15 1004  BP: 138/74  Pulse: 69  Temp: 97.8 F (36.6 C)  TempSrc: Oral  Resp: 16  Height: 5\' 7"  (1.702 m)  Weight: 269 lb 8 oz (122.244 kg)  SpO2: 95%  Body mass index is 42.2 kg/(m^2).  Physical Exam  Constitutional: Patient appears well-developed and well-nourished. Obese  No distress.  HEENT: head atraumatic, normocephalic, pupils equal and reactive to light, , neck supple, throat within normal limits Cardiovascular: Normal rate, regular rhythm and normal heart sounds.  No murmur heard. Trace  BLE edema- on ankles Pulmonary/Chest: Effort normal and breath sounds normal. No respiratory distress. Abdominal: Soft.  There is no tenderness. Psychiatric: Patient has a normal mood and affect. behavior is normal. Judgment and thought content normal. Muscular Skeletal: grinding with extension of both knees, wearing brace on right knee  Recent Results (from the past 2160 hour(s))  TSH     Status: Abnormal   Collection Time: 07/26/15 11:07 AM  Result Value Ref Range   TSH 24.720 (H) 0.450 - 4.500 uIU/mL  Calcium, ionized     Status: Abnormal   Collection Time: 07/26/15 11:07 AM  Result Value Ref Range   Calcium, Ion 5.8 (H) 4.5 - 5.6 mg/dL  POCT HgB A1C     Status: None   Collection Time: 10/23/15 10:25 AM  Result Value Ref Range   Hemoglobin A1C 5.8       PHQ2/9: Depression screen [facility] 2/9 08/27/2015 07/26/2015 04/25/2015 01/21/2015  Decreased Interest 0 0 0 0  Down, Depressed, Hopeless 0 0  0 0  PHQ - 2 Score 0 0 0 0     Fall Risk: Fall Risk  08/27/2015 07/26/2015 04/25/2015 01/21/2015  Falls in the past year? No No No No      Assessment & Plan  1. Diabetic sensorimotor neuropathy (HCC)  - POCT HgB A1C - traMADol (ULTRAM) 50 MG tablet; Take 1 tablet (50 mg total) by mouth 3 (three) times daily.  Dispense: 90 tablet; Refill: 2 - gabapentin (NEURONTIN) 300 MG capsule; Take 1 capsule (300 mg total) by mouth 3 (three) times daily. Gradually go up , to a goal of 300 mg three times daily  Dispense: 270 capsule; Refill: 1  2. Other specified hypothyroidism  - TSH  3. Dyslipidemia  Continue medication   4. Essential (primary) hypertension  At goal   5. Background retinopathy due to secondary diabetes ([place])  Last eye exam back to normal, continue yearly eye exam  6. Arthralgia of both knees  - traMADol (ULTRAM) 50 MG tablet; Take 1 tablet (50 mg total) by mouth 3 (three) times daily.  Dispense: 90 tablet; Refill: 2  7. Bilateral edema of lower extremity  - Elastic Bandages & Supports (MEDICAL COMPRESSION STOCKINGS) MISC; 2 each by Does not apply route daily. For 12 hours on and off at night  Dispense: 2 each; Refill: 0  8. Type 2 diabetes, controlled, with neuropathy (HCC)  - gabapentin (NEURONTIN) 300 MG capsule; Take 1 capsule (300 mg total) by mouth 3 (three) times daily. Gradually go up , to a goal of 300 mg three times daily  Dispense: 270 capsule; Refill: 1  9. Simple chronic bronchitis (HCC)  - Ipratropium-Albuterol (COMBIVENT RESPIMAT) 20-100 MCG/ACT AERS respimat; Inhale 1 puff into the lungs every 6 (six) hours as needed for wheezing.  Dispense: 1 Inhaler; Refill: 2  10. Cigarette smoker  Not ready to quit  11. Morbid obesity, unspecified obesity type [facility])  Discussed with the patient the risk posed by an increased BMI. Discussed importance of portion control, calorie counting and at least 150 minutes of physical activity weekly. Avoid sweet  beverages and drink more water. Eat at least 6 servings of fruit and vegetables daily

## 2015-10-24 ENCOUNTER — Other Ambulatory Visit: Payer: Self-pay | Admitting: Family Medicine

## 2015-10-24 DIAGNOSIS — E038 Other specified hypothyroidism: Secondary | ICD-10-CM

## 2015-10-24 LAB — TSH: TSH: 0.278 u[IU]/mL — AB (ref 0.450–4.500)

## 2015-11-02 ENCOUNTER — Other Ambulatory Visit: Payer: Self-pay | Admitting: Family Medicine

## 2015-12-06 ENCOUNTER — Other Ambulatory Visit: Payer: Self-pay | Admitting: Family Medicine

## 2015-12-06 DIAGNOSIS — E038 Other specified hypothyroidism: Secondary | ICD-10-CM | POA: Diagnosis not present

## 2015-12-07 LAB — TSH: TSH: 0.159 u[IU]/mL — AB (ref 0.450–4.500)

## 2015-12-09 ENCOUNTER — Other Ambulatory Visit: Payer: Self-pay | Admitting: Family Medicine

## 2015-12-09 MED ORDER — LEVOTHYROXINE SODIUM 137 MCG PO TABS: 150.0000 ug | ORAL_TABLET | Freq: Every day | ORAL | Status: DC

## 2015-12-17 ENCOUNTER — Encounter: Payer: Self-pay | Admitting: Sports Medicine

## 2015-12-17 ENCOUNTER — Ambulatory Visit (INDEPENDENT_AMBULATORY_CARE_PROVIDER_SITE_OTHER): Payer: BLUE CROSS/BLUE SHIELD | Admitting: Sports Medicine

## 2015-12-17 DIAGNOSIS — E1142 Type 2 diabetes mellitus with diabetic polyneuropathy: Secondary | ICD-10-CM

## 2015-12-17 DIAGNOSIS — B351 Tinea unguium: Secondary | ICD-10-CM | POA: Diagnosis not present

## 2015-12-17 DIAGNOSIS — L84 Corns and callosities: Secondary | ICD-10-CM | POA: Diagnosis not present

## 2015-12-17 DIAGNOSIS — M79672 Pain in left foot: Secondary | ICD-10-CM

## 2015-12-17 DIAGNOSIS — M79671 Pain in right foot: Secondary | ICD-10-CM

## 2015-12-17 DIAGNOSIS — M2040 Other hammer toe(s) (acquired), unspecified foot: Secondary | ICD-10-CM | POA: Diagnosis not present

## 2015-12-17 NOTE — Patient Instructions (Signed)

## 2015-12-17 NOTE — Progress Notes (Signed)
Patient ID: Charlene Hardy, female   DOB: 01-01-1956, 60 y.o.   MRN: [id number]   Subjective: Charlene Hardy is a 60 y.o. female patient with history of type 2 diabetes who presents to office today complaining of  long, painful nails  while ambulating in shoes; unable to trim. Patient states that the glucose reading this morning was not recorded. Patient is also here for pick up diabetic shoes. Patient denies any new changes in medication or new problems. Patient denies any new cramping, numbness, burning or tingling in the legs.  Patient Active Problem List   Diagnosis Date Noted  . Arthralgia of both knees 10/23/2015  . Chronic bronchitis ([place]) 10/23/2015  . Morbid obesity ([place]) 10/23/2015  . Osteopenia 10/23/2015  . Left ankle pain 04/25/2015  . Constipation 04/25/2015  . Allergic rhinitis 01/20/2015  . 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/2015  . Complication of diabetes mellitus ([place]) 01/20/2015  . Diabetic sensorimotor neuropathy ([place]) 01/20/2015  . Dyslipidemia 01/20/2015  . Essential (primary) hypertension 01/20/2015  . H/O iron deficiency anemia 01/20/2015  . Hypertensive retinopathy 01/20/2015  . Adult hypothyroidism 01/20/2015  . Extreme obesity ([place]) 01/20/2015  . Background retinopathy due to secondary diabetes ([place]) 01/20/2015  . Tinea pedis 01/20/2015  . Vitamin D deficiency 02/26/2010  . Cigarette smoker 03/06/2008   Current Outpatient Prescriptions on File Prior to Visit  Medication Sig Dispense Refill  . acetaminophen (TYLENOL) 500 MG tablet Take 1 tablet (500 mg total) by mouth 2 (two) times daily. 30 tablet 0  . amLODipine-benazepril (LOTREL) 5-40 MG capsule Take 1 capsule by mouth daily. 90 capsule 1  . aspirin 81 MG tablet Take 1 tablet by mouth daily.    . Cholecalciferol (VITAMIN D) 2000 UNITS tablet Take 1 tablet by mouth daily.    Regino Schultze Bandages & Supports (MEDICAL COMPRESSION STOCKINGS) MISC 2 each by Does not apply route daily. For 12 hours on and off at night  2 each 0  . ferrous sulfate 325 (65 FE) MG tablet Take 1 tablet by mouth daily.    Marland Kitchen gabapentin (NEURONTIN) 300 MG capsule Take 1 capsule (300 mg total) by mouth 3 (three) times daily. Gradually go up , to a goal of 300 mg three times daily 270 capsule 1  . Ipratropium-Albuterol (COMBIVENT RESPIMAT) 20-100 MCG/ACT AERS respimat Inhale 1 puff into the lungs every 6 (six) hours as needed for wheezing. 1 Inhaler 2  . levothyroxine (SYNTHROID, LEVOTHROID) 137 MCG tablet Take 1 tablet (137 mcg total) by mouth daily. 30 tablet 1  . metFORMIN (GLUCOPHAGE) 500 MG tablet Take 1 tablet (500 mg total) by mouth daily. 90 tablet 1  . metoprolol (TOPROL-XL) 200 MG 24 hr tablet Take 1 tablet (200 mg total) by mouth at bedtime. 90 tablet 1  . MULTIPLE VITAMINS-MINERALS ER PO Take 1 tablet by mouth daily.    . Naftifine HCl 2 % CREA Apply 1 g topically 2 (two) times daily. 60 g 1  . omega-3 fish oil (MAXEPA) 1000 MG CAPS capsule Take 1 capsule by mouth daily.    . Polyethylene Glycol 3350 POWD Take 1 Dose by mouth as needed.    . pravastatin (PRAVACHOL) 40 MG tablet Take 1 tablet (40 mg total) by mouth daily. 90 tablet 1  . traMADol (ULTRAM) 50 MG tablet Take 1 tablet (50 mg total) by mouth 3 (three) times daily. 90 tablet 2   No current facility-administered medications on file prior to visit.   Allergies  Allergen Reactions  . Hydrochlorothiazide  hyperparathyroidism  . Pneumococcal Vaccine Rash  . Pneumovax [Pneumococcal Polysaccharide Vaccine]     Objective: General: Patient is awake, alert, and oriented x 3 and in no acute distress.  Integument: Skin is warm, dry and supple bilateral. Nails are tender, long, thickened and  dystrophic with subungual debris, consistent with onychomycosis, 1-5 bilateral. Resolved interdgital maceration consistent with tinea. No signs of infection. + callus medial aspect of right big toe with no sign of infection. Remaining integument unremarkable.  Vasculature:   Dorsalis Pedis pulse 1/4 bilateral. Posterior Tibial pulse  1/4 bilateral.  Capillary fill time <3 sec 1-5 bilateral. Scant hair growth to the level of the digits. Temperature gradient within normal limits. No varicosities present bilateral. Mild pitting trace edema present bilateral ankles.   Neurology: The patient has absent sensation measured with a 5.07/10g Semmes Weinstein Monofilament at all pedal sites bilateral . Vibratory sensation severely diminished bilateral with tuning fork. No Babinski sign present bilateral.   Musculoskeletal: + hammertoe deformities noted bilateral. Muscular strength 5/5 in all lower extremity muscular groups bilateral without pain or limitation on range of motion . No tenderness with calf compression bilateral.  Assessment and Plan: Problem List Items Addressed This Visit    None    Visit Diagnoses    Dermatophytosis of nail    -  Primary    Callus of foot        Hammer toe, unspecified laterality        Diabetic polyneuropathy associated with type 2 diabetes mellitus (HCC)        Foot pain, bilateral           -Examined patient. -Discussed and educated patient on diabetic foot care, especially with  regards to the vascular, neurological and musculoskeletal systems.  -Stressed the importance of good glycemic control and the detriment of not  controlling glucose levels in relation to the foot. -Mechanically debrided callus x1 using sterile chisel blade and all nails 1-5 bilateral using sterile nail nipper and filed with dremel without incident  -Diabetic shoes fitted and dispensed with break in period explained  -Advised patient to wear compression stockings as Rx to assist with edema control -Will continue to monitor neuropathic pain if worsens neurology consult or consider discussion with PCP about increasing or changing Gabapentin  -Answered all patient questions -Patient to return a in 3 months for at risk foot care -Patient advised to call the  office if any problems or questions arise in the  Meantime.  Landis Martins, DPM

## 2016-01-04 ENCOUNTER — Other Ambulatory Visit: Payer: Self-pay | Admitting: Family Medicine

## 2016-01-27 ENCOUNTER — Ambulatory Visit (INDEPENDENT_AMBULATORY_CARE_PROVIDER_SITE_OTHER): Payer: BLUE CROSS/BLUE SHIELD | Admitting: Family Medicine

## 2016-01-27 ENCOUNTER — Encounter: Payer: Self-pay | Admitting: Family Medicine

## 2016-01-27 VITALS — BP 158/86 | HR 68 | Temp 97.7°F | Resp 16 | Ht 67.0 in | Wt 264.8 lb

## 2016-01-27 DIAGNOSIS — R600 Localized edema: Secondary | ICD-10-CM

## 2016-01-27 DIAGNOSIS — E1140 Type 2 diabetes mellitus with diabetic neuropathy, unspecified: Secondary | ICD-10-CM

## 2016-01-27 DIAGNOSIS — E13319 Other specified diabetes mellitus with unspecified diabetic retinopathy without macular edema: Secondary | ICD-10-CM | POA: Diagnosis not present

## 2016-01-27 DIAGNOSIS — I10 Essential (primary) hypertension: Secondary | ICD-10-CM | POA: Diagnosis not present

## 2016-01-27 DIAGNOSIS — E1142 Type 2 diabetes mellitus with diabetic polyneuropathy: Secondary | ICD-10-CM

## 2016-01-27 DIAGNOSIS — Z720 Tobacco use: Secondary | ICD-10-CM | POA: Diagnosis not present

## 2016-01-27 DIAGNOSIS — J410 Simple chronic bronchitis: Secondary | ICD-10-CM | POA: Diagnosis not present

## 2016-01-27 DIAGNOSIS — E038 Other specified hypothyroidism: Secondary | ICD-10-CM | POA: Diagnosis not present

## 2016-01-27 DIAGNOSIS — F17210 Nicotine dependence, cigarettes, uncomplicated: Secondary | ICD-10-CM

## 2016-01-27 DIAGNOSIS — E785 Hyperlipidemia, unspecified: Secondary | ICD-10-CM

## 2016-01-27 DIAGNOSIS — E133299 Other specified diabetes mellitus with mild nonproliferative diabetic retinopathy without macular edema, unspecified eye: Secondary | ICD-10-CM

## 2016-01-27 LAB — POCT GLYCOSYLATED HEMOGLOBIN (HGB A1C): Hemoglobin A1C: 5.6

## 2016-01-27 LAB — POCT UA - MICROALBUMIN: MICROALBUMIN (UR) POC: 20 mg/L

## 2016-01-27 LAB — TSH: TSH: 0.51 mIU/L

## 2016-01-27 MED ORDER — METFORMIN HCL 500 MG PO TABS: 500.0000 mg | ORAL_TABLET | Freq: Every day | ORAL | Status: DC

## 2016-01-27 MED ORDER — CHLORTHALIDONE 25 MG PO TABS: 25.0000 mg | ORAL_TABLET | Freq: Every day | ORAL | Status: DC

## 2016-01-27 MED ORDER — FUROSEMIDE 20 MG PO TABS: 20.0000 mg | ORAL_TABLET | Freq: Every day | ORAL | Status: DC

## 2016-01-27 MED ORDER — ALBUTEROL SULFATE (2.5 MG/3ML) 0.083% IN NEBU
2.5000 mg | INHALATION_SOLUTION | Freq: Once | RESPIRATORY_TRACT | Status: AC
Start: 2016-01-27 — End: 2016-01-27
  Administered 2016-01-27: 2.5 mg via RESPIRATORY_TRACT

## 2016-01-27 MED ORDER — POTASSIUM CHLORIDE CRYS ER 20 MEQ PO TBCR
20.0000 meq | EXTENDED_RELEASE_TABLET | Freq: Every day | ORAL | Status: DC
Start: 2016-01-27 — End: 2016-02-28

## 2016-01-27 NOTE — Progress Notes (Signed)
Name: Charlene Hardy   MRN: [id number]    DOB: 07/22/1956   Date:01/27/2016       Progress Note  Subjective  Chief Complaint  Chief Complaint  Patient presents with  . Medication Refill    3 month F/U  . Diabetes    Checks BS once in the mornings, Low-98 High-112. Patient states she has her Diabetic Shoes and inserts but her feet are still swelling  . Hypertension    Headaches, edema in bilateral ankles  . Hyperlipidemia  . Hypothyroidism    Dry Skin and Hot Intolerance  . Obesity    Patient has lost 6 pounds without trying since last visit  . Cough    Improving    HPI  Hypothyroidism: taking medication as prescribed, TSH was very high on her past two labs and is now on 150 mcg of Synthroid, last TSH was too low and she is down to 137 mcg daily She was seen by Dr Gabriel Carina but just for hypercalcemia - bone density showed osteopenia but low FRAX score, not on medication for it. . She states she takes the medication by itself with water with an empty stomach. She has lost some weight since last visit, hair is growing now but she has chronic dry skin. No dysphagia. No change in bowel movements  DMII: taking Metformin, last eye exam no diabetic retinopathy, she has peripheral neuropathy - only lack of sensation - severe bilateral foot pain, taking Gabapentin and seeing Podiatrist, Dr. Cannon Kettle. She is compliant with medication, denies polyphagia, polyuria or polydipsia. She states she has been taking Gabapentin has helped with symptoms, she stopped Tramadol because she is worried about falls. She has been swelling on both lower extremities but swelling is worse on left foot - she was not able to afford compression stocking hose prescription yet  Obesity: with multiple risk factors, also bilateral knee pain, lost some weight since last visit, discussed life style modification, she is cooking twice a week and eats sandwiches other days of the week, avoiding eating except for breakfast  Bilateral  knee arthralgias: switch from left to right intermittent knee pain. She has been on Meloxicam because of kidney function and now off Tramadol since started on Gabapentin, knee pain has improved, occasionally has effusion  Chronic bronchitis: She is still smoking and not ready to quit. She denies SOB but has a daily dry cough. Not able to afford any inhalers at this time, reviewed Spirometry and FEV1/FVC was normal, no improvement after albuterol, she will get Combivent filled to take prn.   HTN: she is taking medications as prescribed, bp is usually at goal, but elevated today. She states she woke up with a headache , no pain right now. No chest pain or palpitation. She also has lower extremity edema, she can't take HCTZ because it causes hypercalcemia, we will try Furosemide in am's for swelling and compression stocking hoses  Patient Active Problem List   Diagnosis Date Noted  . Arthralgia of both knees 10/23/2015  . Chronic bronchitis ([place]) 10/23/2015  . Morbid obesity ([place]) 10/23/2015  . Osteopenia 10/23/2015  . Left ankle pain 04/25/2015  . Constipation 04/25/2015  . Allergic rhinitis 01/20/2015  . 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/2015  . Complication of diabetes mellitus ([place]) 01/20/2015  . Diabetic sensorimotor neuropathy ([place]) 01/20/2015  . Dyslipidemia 01/20/2015  . Essential (primary) hypertension 01/20/2015  . H/O iron deficiency anemia 01/20/2015  . Hypertensive retinopathy 01/20/2015  . Adult hypothyroidism 01/20/2015  . Extreme obesity ([place]) 01/20/2015  .  Background retinopathy due to secondary diabetes ([place]) 01/20/2015  . Tinea pedis 01/20/2015  . Vitamin D deficiency 02/26/2010  . Cigarette smoker 03/06/2008    Past Surgical History  Procedure Laterality Date  . Abdominal hysterectomy  08/11/1999    still has ovaries    Family History  Problem Relation Age of Onset  . Multiple sclerosis Mother   . Diabetes Father   . Breast cancer Sister     Social History    Social History  . Marital Status: Single    Spouse Name: N/A  . Number of Children: N/A  . Years of Education: N/A   Occupational History  . Not on file.   Social History Main Topics  . Smoking status: Current Every Day Smoker -- 1.00 packs/day for 37 years    Types: Cigarettes    Start date: 08/10/1976  . Smokeless tobacco: Never Used  . Alcohol Use: No  . Drug Use: No  . Sexual Activity: Not Currently   Other Topics Concern  . Not on file   Social History Narrative     Current outpatient prescriptions:  .  acetaminophen (TYLENOL) 500 MG tablet, Take 1 tablet (500 mg total) by mouth 2 (two) times daily., Disp: 30 tablet, Rfl: 0 .  amLODipine-benazepril (LOTREL) 5-40 MG capsule, Take 1 capsule by mouth daily., Disp: 90 capsule, Rfl: 1 .  aspirin 81 MG tablet, Take 1 tablet by mouth daily., Disp: , Rfl:  .  Cholecalciferol (VITAMIN D) 2000 UNITS tablet, Take 1 tablet by mouth daily., Disp: , Rfl:  .  Elastic Bandages & Supports ([place]) MISC, 2 each by Does not apply route daily. For 12 hours on and off at night, Disp: 2 each, Rfl: 0 .  ferrous sulfate 325 (65 FE) MG tablet, Take 1 tablet by mouth daily., Disp: , Rfl:  .  gabapentin (NEURONTIN) 300 MG capsule, Take 1 capsule (300 mg total) by mouth 3 (three) times daily. Gradually go up , to a goal of 300 mg three times daily, Disp: 270 capsule, Rfl: 1 .  Ipratropium-Albuterol (COMBIVENT RESPIMAT) 20-100 MCG/ACT AERS respimat, Inhale 1 puff into the lungs every 6 (six) hours as needed for wheezing., Disp: 1 Inhaler, Rfl: 2 .  levothyroxine (SYNTHROID, LEVOTHROID) 137 MCG tablet, Take 1 tablet (137 mcg total) by mouth daily., Disp: 30 tablet, Rfl: 1 .  metFORMIN (GLUCOPHAGE) 500 MG tablet, Take 1 tablet (500 mg total) by mouth daily., Disp: 90 tablet, Rfl: 1 .  metoprolol (TOPROL-XL) 200 MG 24 hr tablet, Take 1 tablet (200 mg total) by mouth at bedtime., Disp: 90 tablet, Rfl: 1 .  MULTIPLE  VITAMINS-MINERALS ER PO, Take 1 tablet by mouth daily., Disp: , Rfl:  .  Naftifine HCl 2 % CREA, Apply 1 g topically 2 (two) times daily., Disp: 60 g, Rfl: 1 .  omega-3 fish oil (MAXEPA) 1000 MG CAPS capsule, Take 1 capsule by mouth daily., Disp: , Rfl:  .  Polyethylene Glycol 3350 POWD, Take 1 Dose by mouth as needed., Disp: , Rfl:  .  pravastatin (PRAVACHOL) 40 MG tablet, Take 1 tablet (40 mg total) by mouth daily., Disp: 90 tablet, Rfl: 1 .  furosemide (LASIX) 20 MG tablet, Take 1 tablet (20 mg total) by mouth daily., Disp: 30 tablet, Rfl: 0 .  potassium chloride SA (K-DUR,KLOR-CON) 20 MEQ tablet, Take 1 tablet (20 mEq total) by mouth daily., Disp: 30 tablet, Rfl: 0  Allergies  Allergen Reactions  . Hydrochlorothiazide  hyperparathyroidism  . Pneumococcal Vaccine Rash  . Pneumovax [Pneumococcal Polysaccharide Vaccine]      ROS  Constitutional: Negative for fever , positive for weight change.  Respiratory: Positive for cough but no shortness of breath.   Cardiovascular: Negative for chest pain or palpitations.  Gastrointestinal: Negative for abdominal pain, no bowel changes.  Musculoskeletal: Negative for gait problem , positive for joint swelling.  Skin: Negative for rash.  Neurological: Negative for dizziness, positive for headache.  No other specific complaints in a complete review of systems (except as listed in HPI above).  Objective  Filed Vitals:   01/27/16 0917  BP: 158/86  Pulse: 68  Temp: 97.7 F (36.5 C)  TempSrc: Oral  Resp: 16  Height: 5\' 7"  (1.702 m)  Weight: 264 lb 12.8 oz (120.112 kg)  SpO2: 98%    Body mass index is 41.46 kg/(m^2).  Physical Exam  Constitutional: Patient appears well-developed and well-nourished. Obese  No distress.  HEENT: head atraumatic, normocephalic, pupils equal and reactive to light,neck supple, throat within normal limits Cardiovascular: Normal rate, regular rhythm and normal heart sounds.  No murmur heard. No BLE  edema. Pulmonary/Chest: Effort normal and breath sounds normal. No respiratory distress. Abdominal: Soft.  There is no tenderness. Psychiatric: Patient has a normal mood and affect. behavior is normal. Judgment and thought content normal. Neurological: no focal findings, Romberg negative  Recent Results (from the past 2160 hour(s))  TSH     Status: Abnormal   Collection Time: 12/06/15  3:23 PM  Result Value Ref Range   TSH 0.159 (L) 0.450 - 4.500 uIU/mL  POCT HgB A1C     Status: None   Collection Time: 01/27/16  9:32 AM  Result Value Ref Range   Hemoglobin A1C 5.6   POCT UA - Microalbumin     Status: None   Collection Time: 01/27/16  9:52 AM  Result Value Ref Range   Microalbumin Ur, POC 20 mg/L   Creatinine, POC  mg/dL   Albumin/Creatinine Ratio, Urine, POC       PHQ2/9: Depression screen [facility] 2/9 01/27/2016 08/27/2015 07/26/2015 04/25/2015 01/21/2015  Decreased Interest 0 0 0 0 0  Down, Depressed, Hopeless 0 0 0 0 0  PHQ - 2 Score 0 0 0 0 0     Fall Risk: Fall Risk  01/27/2016 08/27/2015 07/26/2015 04/25/2015 01/21/2015  Falls in the past year? No No No No No     Functional Status Survey: Is the patient deaf or have difficulty hearing?: No Does the patient have difficulty seeing, even when wearing glasses/contacts?: No Does the patient have difficulty concentrating, remembering, or making decisions?: No Does the patient have difficulty walking or climbing stairs?: No Does the patient have difficulty dressing or bathing?: No Does the patient have difficulty doing errands alone such as visiting a doctor's office or shopping?: No    Assessment & Plan  1. Background retinopathy due to secondary diabetes ([place])  - POCT HgB A1C - POCT UA - Microalbumin She can try weaning self off Metformin   2. Diabetic sensorimotor neuropathy ([facility])  - POCT HgB A1C - POCT UA - Microalbumin  3. Simple chronic bronchitis ([facility])  - Spirometry: Pre & Post Eval - albuterol (PROVENTIL)  (2.5 MG/3ML) 0.083% nebulizer solution 2.5 mg; Take 3 mLs (2.5 mg total) by nebulization once.  4. Cigarette smoker  - Spirometry: Pre & Post Eval - albuterol (PROVENTIL) (2.5 MG/3ML) 0.083% nebulizer solution 2.5 mg; Take 3 mLs (2.5 mg total) by nebulization once.  5. Dyslipidemia  Reviewed last labs with patient   6. Essential (primary) hypertension  Elevated today, usually at goal, we will recheck it before she leaves and monitor at home, we can't add HCTZ because of history of hypercalcemia, we will try Furosemide  7. Other specified hypothyroidism  -TSH  8. Morbid obesity, unspecified obesity type [facility])  Discussed with the patient the risk posed by an increased BMI. Discussed importance of portion control, calorie counting and at least 150 minutes of physical activity weekly. Avoid sweet beverages and drink more water. Eat at least 6 servings of fruit and vegetables daily   9. Type 2 diabetes, controlled, with neuropathy ([place])  Can try taking it every other day and monitor it, hgbA1C is at goal  - metFORMIN (GLUCOPHAGE) 500 MG tablet; Take 1 tablet (500 mg total) by mouth daily.  Dispense: 90 tablet; Refill: 1   10. Bilateral edema of lower extremity  - furosemide (LASIX) 20 MG tablet; Take 1 tablet (20 mg total) by mouth daily.  Dispense: 30 tablet; Refill: 0 - potassium chloride SA (K-DUR,KLOR-CON) 20 MEQ tablet; Take 1 tablet (20 mEq total) by mouth daily.  Dispense: 30 tablet; Refill: 0

## 2016-02-27 DIAGNOSIS — E210 Primary hyperparathyroidism: Secondary | ICD-10-CM | POA: Diagnosis not present

## 2016-02-27 DIAGNOSIS — E039 Hypothyroidism, unspecified: Secondary | ICD-10-CM | POA: Diagnosis not present

## 2016-02-28 ENCOUNTER — Encounter: Payer: Self-pay | Admitting: Family Medicine

## 2016-02-28 ENCOUNTER — Ambulatory Visit (INDEPENDENT_AMBULATORY_CARE_PROVIDER_SITE_OTHER): Payer: BLUE CROSS/BLUE SHIELD | Admitting: Family Medicine

## 2016-02-28 VITALS — BP 138/82 | HR 77 | Temp 97.8°F | Resp 18 | Wt 261.0 lb

## 2016-02-28 DIAGNOSIS — D72829 Elevated white blood cell count, unspecified: Secondary | ICD-10-CM

## 2016-02-28 DIAGNOSIS — E785 Hyperlipidemia, unspecified: Secondary | ICD-10-CM

## 2016-02-28 DIAGNOSIS — E038 Other specified hypothyroidism: Secondary | ICD-10-CM | POA: Diagnosis not present

## 2016-02-28 DIAGNOSIS — R600 Localized edema: Secondary | ICD-10-CM

## 2016-02-28 DIAGNOSIS — H35039 Hypertensive retinopathy, unspecified eye: Secondary | ICD-10-CM | POA: Diagnosis not present

## 2016-02-28 DIAGNOSIS — I10 Essential (primary) hypertension: Secondary | ICD-10-CM

## 2016-02-28 DIAGNOSIS — E210 Primary hyperparathyroidism: Secondary | ICD-10-CM

## 2016-02-28 LAB — CBC WITH DIFFERENTIAL/PLATELET
BASOS PCT: 0 %
Basophils Absolute: 0 cells/uL (ref 0–200)
EOS PCT: 3 %
Eosinophils Absolute: 321 cells/uL (ref 15–500)
HCT: 41.8 % (ref 35.0–45.0)
HEMOGLOBIN: 13.7 g/dL (ref 11.7–15.5)
LYMPHS ABS: 2675 {cells}/uL (ref 850–3900)
Lymphocytes Relative: 25 %
MCH: 25.8 pg — ABNORMAL LOW (ref 27.0–33.0)
MCHC: 32.8 g/dL (ref 32.0–36.0)
MCV: 78.7 fL — ABNORMAL LOW (ref 80.0–100.0)
MPV: 11 fL (ref 7.5–12.5)
Monocytes Absolute: 856 cells/uL (ref 200–950)
Monocytes Relative: 8 %
NEUTROS PCT: 64 %
Neutro Abs: 6848 cells/uL (ref 1500–7800)
Platelets: 251 10*3/uL (ref 140–400)
RBC: 5.31 MIL/uL — AB (ref 3.80–5.10)
RDW: 15.4 % — AB (ref 11.0–15.0)
WBC: 10.7 10*3/uL (ref 3.8–10.8)

## 2016-02-28 LAB — COMPLETE METABOLIC PANEL WITH GFR
ALT: 11 U/L (ref 6–29)
AST: 13 U/L (ref 10–35)
Albumin: 3.7 g/dL (ref 3.6–5.1)
Alkaline Phosphatase: 104 U/L (ref 33–130)
BUN: 10 mg/dL (ref 7–25)
CHLORIDE: 105 mmol/L (ref 98–110)
CO2: 28 mmol/L (ref 20–31)
Calcium: 10.2 mg/dL (ref 8.6–10.4)
Creat: 0.84 mg/dL (ref 0.50–0.99)
GFR, Est African American: 87 mL/min (ref >=60)
GFR, Est Non African American: 76 mL/min (ref >=60)
GLUCOSE: 80 mg/dL (ref 65–99)
POTASSIUM: 4.7 mmol/L (ref 3.5–5.3)
SODIUM: 141 mmol/L (ref 135–146)
Total Bilirubin: 0.3 mg/dL (ref 0.2–1.2)
Total Protein: 6.7 g/dL (ref 6.1–8.1)

## 2016-02-28 LAB — LIPID PANEL
Cholesterol: 120 mg/dL — ABNORMAL LOW (ref 125–200)
HDL: 38 mg/dL — AB (ref >=46)
LDL CALC: 54 mg/dL (ref <130)
Total CHOL/HDL Ratio: 3.2 Ratio (ref <=5.0)
Triglycerides: 139 mg/dL (ref <150)
VLDL: 28 mg/dL (ref <30)

## 2016-02-28 LAB — TSH: TSH: 0.36 mIU/L — ABNORMAL LOW

## 2016-02-28 MED ORDER — MEDICAL COMPRESSION STOCKINGS MISC: 2.0000 | Freq: Every day | Status: DC

## 2016-02-28 MED ORDER — FUROSEMIDE 20 MG PO TABS: 20.0000 mg | ORAL_TABLET | Freq: Every day | ORAL | Status: DC

## 2016-02-28 MED ORDER — METOPROLOL SUCCINATE ER 100 MG PO TB24: 100.0000 mg | ORAL_TABLET | Freq: Every day | ORAL | Status: DC

## 2016-02-28 MED ORDER — POTASSIUM CHLORIDE CRYS ER 20 MEQ PO TBCR: 20.0000 meq | EXTENDED_RELEASE_TABLET | Freq: Every day | ORAL | Status: DC

## 2016-02-28 NOTE — Progress Notes (Signed)
Name: Charlene Hardy   MRN: [id number]    DOB: 1956-06-26   Date:02/28/2016       Progress Note  Subjective  Chief Complaint  Chief Complaint  Patient presents with  . Hypertension    patient is here for her 1- month f/u  . Foot Swelling    patient stated that the fluid pill helped with the swelling    HPI  Primary hyperparathyroidism: seen by Dr. Graceann Congress and had vitamin and Pth done yesterday.  Leg swelling: started on Furosemide and potassium on her last visit, one month ago. Symptoms have improved. She did not get compression stocking hoses, no cramping, she has noticed increase in urinary frequency  HTN: her bp today is low, she is on lasix, and high dose metoprolol and Lotrel, we will try decreasing dose of Metoprolol to 100 mg daily and she will monitor her bp at local pharmacy if bp goes up above 140/90 she will contact me to adjust medication again.   Hyperlipidemia: taking Pravachol and we will recheck labs today, no chest pain.    Patient Active Problem List   Diagnosis Date Noted  . Arthralgia of both knees 10/23/2015  . Chronic bronchitis ([place]) 10/23/2015  . Morbid obesity ([place]) 10/23/2015  . Osteopenia 10/23/2015  . Left ankle pain 04/25/2015  . Constipation 04/25/2015  . Allergic rhinitis 01/20/2015  . 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/2015  . Complication of diabetes mellitus ([place]) 01/20/2015  . Diabetic sensorimotor neuropathy ([place]) 01/20/2015  . Dyslipidemia 01/20/2015  . Essential (primary) hypertension 01/20/2015  . H/O iron deficiency anemia 01/20/2015  . Hypertensive retinopathy 01/20/2015  . Adult hypothyroidism 01/20/2015  . Extreme obesity ([place]) 01/20/2015  . Background retinopathy due to secondary diabetes ([place]) 01/20/2015  . Tinea pedis 01/20/2015  . Vitamin D deficiency 02/26/2010  . Cigarette smoker 03/06/2008    Past Surgical History  Procedure Laterality Date  . Abdominal hysterectomy  08/11/1999    still has ovaries    Family History   Problem Relation Age of Onset  . Multiple sclerosis Mother   . Diabetes Father   . Breast cancer Sister     Social History   Social History  . Marital Status: Single    Spouse Name: N/A  . Number of Children: N/A  . Years of Education: N/A   Occupational History  . Not on file.   Social History Main Topics  . Smoking status: Current Every Day Smoker -- 1.00 packs/day for 37 years    Types: Cigarettes    Start date: 08/10/1976  . Smokeless tobacco: Never Used  . Alcohol Use: No  . Drug Use: No  . Sexual Activity: Not Currently   Other Topics Concern  . Not on file   Social History Narrative     Current outpatient prescriptions:  .  acetaminophen (TYLENOL) 500 MG tablet, Take 1 tablet (500 mg total) by mouth 2 (two) times daily., Disp: 30 tablet, Rfl: 0 .  amLODipine-benazepril (LOTREL) 5-40 MG capsule, Take 1 capsule by mouth daily., Disp: 90 capsule, Rfl: 1 .  aspirin 81 MG tablet, Take 1 tablet by mouth daily., Disp: , Rfl:  .  Cholecalciferol (VITAMIN D) 2000 UNITS tablet, Take 1 tablet by mouth daily., Disp: , Rfl:  .  ferrous sulfate 325 (65 FE) MG tablet, Take 1 tablet by mouth daily., Disp: , Rfl:  .  furosemide (LASIX) 20 MG tablet, Take 1 tablet (20 mg total) by mouth daily., Disp: 90 tablet, Rfl: 0 .  gabapentin (NEURONTIN)  300 MG capsule, Take 1 capsule (300 mg total) by mouth 3 (three) times daily. Gradually go up , to a goal of 300 mg three times daily, Disp: 270 capsule, Rfl: 1 .  Ipratropium-Albuterol (COMBIVENT RESPIMAT) 20-100 MCG/ACT AERS respimat, Inhale 1 puff into the lungs every 6 (six) hours as needed for wheezing., Disp: 1 Inhaler, Rfl: 2 .  levothyroxine (SYNTHROID, LEVOTHROID) 137 MCG tablet, Take 1 tablet (137 mcg total) by mouth daily., Disp: 30 tablet, Rfl: 1 .  metFORMIN (GLUCOPHAGE) 500 MG tablet, Take 1 tablet (500 mg total) by mouth daily., Disp: 90 tablet, Rfl: 1 .  metoprolol (TOPROL-XL) 100 MG 24 hr tablet, Take 1 tablet (100 mg total)  by mouth at bedtime. Changing to lower dose, Disp: 90 tablet, Rfl: 0 .  MULTIPLE VITAMINS-MINERALS ER PO, Take 1 tablet by mouth daily., Disp: , Rfl:  .  Naftifine HCl 2 % CREA, Apply 1 g topically 2 (two) times daily., Disp: 60 g, Rfl: 1 .  omega-3 fish oil (MAXEPA) 1000 MG CAPS capsule, Take 1 capsule by mouth daily., Disp: , Rfl:  .  Polyethylene Glycol 3350 POWD, Take 1 Dose by mouth as needed., Disp: , Rfl:  .  potassium chloride SA (K-DUR,KLOR-CON) 20 MEQ tablet, Take 1 tablet (20 mEq total) by mouth daily., Disp: 90 tablet, Rfl: 0 .  pravastatin (PRAVACHOL) 40 MG tablet, Take 1 tablet (40 mg total) by mouth daily., Disp: 90 tablet, Rfl: 1 .  Elastic Bandages & Supports (MEDICAL COMPRESSION STOCKINGS) MISC, 2 each by Does not apply route daily. For 12 hours on and off at night, Disp: 2 each, Rfl: 0  Allergies  Allergen Reactions  . Hydrochlorothiazide     hyperparathyroidism  . Pneumococcal Vaccine Rash  . Pneumovax [Pneumococcal Polysaccharide Vaccine]      ROS  Ten systems reviewed and is negative except as mentioned in HPI   Objective  Filed Vitals:   02/28/16 1023 02/28/16 1025  BP: 108/72 138/82  Pulse: 77   Temp: 97.8 F (36.6 C)   TempSrc: Oral   Resp: 18   Weight: 261 lb (118.389 kg)   SpO2: 98%     Body mass index is 40.87 kg/(m^2).  Physical Exam  Constitutional: Patient appears well-developed and well-nourished. Obese  No distress.  HEENT: head atraumatic, normocephalic, pupils equal and reactive to light,  neck supple, throat within normal limits Cardiovascular: Normal rate, regular rhythm and normal heart sounds.  No murmur heard. 1 plus  BLE edema. Pulmonary/Chest: Effort normal and breath sounds normal. No respiratory distress. Abdominal: Soft.  There is no tenderness. Psychiatric: Patient has a normal mood and affect. behavior is normal. Judgment and thought content normal.  Recent Results (from the past 2160 hour(s))  TSH     Status: Abnormal    Collection Time: 12/06/15  3:23 PM  Result Value Ref Range   TSH 0.159 (L) 0.450 - 4.500 uIU/mL  POCT HgB A1C     Status: None   Collection Time: 01/27/16  9:32 AM  Result Value Ref Range   Hemoglobin A1C 5.6   POCT UA - Microalbumin     Status: None   Collection Time: 01/27/16  9:52 AM  Result Value Ref Range   Microalbumin Ur, POC 20 mg/L   Creatinine, POC  mg/dL   Albumin/Creatinine Ratio, Urine, POC    TSH     Status: None   Collection Time: 01/27/16 11:08 AM  Result Value Ref Range   TSH 0.51 mIU/L  Comment:   Reference Range   > or = 20 Years  0.40-4.50   Pregnancy Range First trimester  0.26-2.66 Second trimester 0.55-2.73 Third trimester  0.43-2.91         PHQ2/9: Depression screen [facility] 2/9 02/28/2016 01/27/2016 08/27/2015 07/26/2015 04/25/2015  Decreased Interest 0 0 0 0 0  Down, Depressed, Hopeless 0 0 0 0 0  PHQ - 2 Score 0 0 0 0 0    Fall Risk: Fall Risk  02/28/2016 01/27/2016 08/27/2015 07/26/2015 04/25/2015  Falls in the past year? No No No No No     Functional Status Survey: Is the patient deaf or have difficulty hearing?: No Does the patient have difficulty seeing, even when wearing glasses/contacts?: No Does the patient have difficulty concentrating, remembering, or making decisions?: No Does the patient have difficulty walking or climbing stairs?: No Does the patient have difficulty dressing or bathing?: No Does the patient have difficulty doing errands alone such as visiting a doctor's office or shopping?: No    Assessment & Plan  1. Dyslipidemia  - Lipid panel  2. Essential (primary) hypertension  - metoprolol (TOPROL-XL) 100 MG 24 hr tablet; Take 1 tablet (100 mg total) by mouth at bedtime. Changing to lower dose  Dispense: 90 tablet; Refill: 0 - COMPLETE METABOLIC PANEL WITH GFR  3. Other specified hypothyroidism  - TSH  4. Leucocytosis  - CBC with Differential/Platelet  5. Bilateral edema of lower extremity  - potassium  chloride SA (K-DUR,KLOR-CON) 20 MEQ tablet; Take 1 tablet (20 mEq total) by mouth daily.  Dispense: 90 tablet; Refill: 0 - furosemide (LASIX) 20 MG tablet; Take 1 tablet (20 mg total) by mouth daily.  Dispense: 90 tablet; Refill: 0 - Elastic Bandages & Supports (MEDICAL COMPRESSION STOCKINGS) MISC; 2 each by Does not apply route daily. For 12 hours on and off at night  Dispense: 2 each; Refill: 0  6. Hypertensive retinopathy, unspecified laterality  - metoprolol (TOPROL-XL) 100 MG 24 hr tablet; Take 1 tablet (100 mg total) by mouth at bedtime. Changing to lower dose  Dispense: 90 tablet; Refill: 0

## 2016-03-02 ENCOUNTER — Other Ambulatory Visit: Payer: Self-pay | Admitting: Family Medicine

## 2016-03-02 MED ORDER — LEVOTHYROXINE SODIUM 125 MCG PO TABS: 125.0000 ug | ORAL_TABLET | Freq: Every day | ORAL | 1 refills | Status: DC

## 2016-03-20 ENCOUNTER — Ambulatory Visit: Payer: BLUE CROSS/BLUE SHIELD | Admitting: Sports Medicine

## 2016-03-31 ENCOUNTER — Ambulatory Visit: Payer: BLUE CROSS/BLUE SHIELD | Admitting: Sports Medicine

## 2016-05-15 ENCOUNTER — Other Ambulatory Visit: Payer: Self-pay | Admitting: Family Medicine

## 2016-05-15 DIAGNOSIS — H35039 Hypertensive retinopathy, unspecified eye: Secondary | ICD-10-CM

## 2016-05-15 NOTE — Telephone Encounter (Signed)
Patient requesting refill of Lotrel to [facility].

## 2016-05-25 ENCOUNTER — Ambulatory Visit (INDEPENDENT_AMBULATORY_CARE_PROVIDER_SITE_OTHER): Payer: BLUE CROSS/BLUE SHIELD | Admitting: Family Medicine

## 2016-05-25 ENCOUNTER — Encounter: Payer: Self-pay | Admitting: Family Medicine

## 2016-05-25 ENCOUNTER — Encounter: Payer: Self-pay | Admitting: *Deleted

## 2016-05-25 VITALS — BP 132/76 | HR 82 | Temp 98.2°F | Resp 16 | Ht 67.0 in | Wt 265.4 lb

## 2016-05-25 DIAGNOSIS — J410 Simple chronic bronchitis: Secondary | ICD-10-CM | POA: Diagnosis not present

## 2016-05-25 DIAGNOSIS — Z1211 Encounter for screening for malignant neoplasm of colon: Secondary | ICD-10-CM | POA: Diagnosis not present

## 2016-05-25 DIAGNOSIS — I10 Essential (primary) hypertension: Secondary | ICD-10-CM | POA: Diagnosis not present

## 2016-05-25 DIAGNOSIS — F17210 Nicotine dependence, cigarettes, uncomplicated: Secondary | ICD-10-CM

## 2016-05-25 DIAGNOSIS — E1140 Type 2 diabetes mellitus with diabetic neuropathy, unspecified: Secondary | ICD-10-CM | POA: Diagnosis not present

## 2016-05-25 DIAGNOSIS — D72829 Elevated white blood cell count, unspecified: Secondary | ICD-10-CM | POA: Diagnosis not present

## 2016-05-25 DIAGNOSIS — E038 Other specified hypothyroidism: Secondary | ICD-10-CM | POA: Diagnosis not present

## 2016-05-25 DIAGNOSIS — E785 Hyperlipidemia, unspecified: Secondary | ICD-10-CM

## 2016-05-25 DIAGNOSIS — Z23 Encounter for immunization: Secondary | ICD-10-CM | POA: Diagnosis not present

## 2016-05-25 DIAGNOSIS — R600 Localized edema: Secondary | ICD-10-CM | POA: Diagnosis not present

## 2016-05-25 DIAGNOSIS — H35039 Hypertensive retinopathy, unspecified eye: Secondary | ICD-10-CM | POA: Diagnosis not present

## 2016-05-25 LAB — CBC WITH DIFFERENTIAL/PLATELET
BASOS PCT: 0 %
Basophils Absolute: 0 cells/uL (ref 0–200)
EOS ABS: 249 {cells}/uL (ref 15–500)
Eosinophils Relative: 3 %
HEMATOCRIT: 41.7 % (ref 35.0–45.0)
HEMOGLOBIN: 13.6 g/dL (ref 11.7–15.5)
LYMPHS ABS: 2324 {cells}/uL (ref 850–3900)
LYMPHS PCT: 28 %
MCH: 26.4 pg — ABNORMAL LOW (ref 27.0–33.0)
MCHC: 32.6 g/dL (ref 32.0–36.0)
MCV: 81 fL (ref 80.0–100.0)
MONO ABS: 664 {cells}/uL (ref 200–950)
MPV: 11.5 fL (ref 7.5–12.5)
Monocytes Relative: 8 %
NEUTROS PCT: 61 %
Neutro Abs: 5063 cells/uL (ref 1500–7800)
Platelets: 266 10*3/uL (ref 140–400)
RBC: 5.15 MIL/uL — ABNORMAL HIGH (ref 3.80–5.10)
RDW: 14.7 % (ref 11.0–15.0)
WBC: 8.3 10*3/uL (ref 3.8–10.8)

## 2016-05-25 LAB — TSH: TSH: 3.41 mIU/L

## 2016-05-25 LAB — POCT GLYCOSYLATED HEMOGLOBIN (HGB A1C): HEMOGLOBIN A1C: 5.8

## 2016-05-25 MED ORDER — ATORVASTATIN CALCIUM 40 MG PO TABS: 40.0000 mg | ORAL_TABLET | Freq: Every day | ORAL | 1 refills | Status: DC

## 2016-05-25 MED ORDER — POTASSIUM CHLORIDE CRYS ER 20 MEQ PO TBCR: 20.0000 meq | EXTENDED_RELEASE_TABLET | Freq: Every day | ORAL | 1 refills | Status: DC

## 2016-05-25 MED ORDER — FUROSEMIDE 20 MG PO TABS: 20.0000 mg | ORAL_TABLET | Freq: Every day | ORAL | 1 refills | Status: DC

## 2016-05-25 MED ORDER — GABAPENTIN 300 MG PO CAPS: 300.0000 mg | ORAL_CAPSULE | Freq: Three times a day (TID) | ORAL | 1 refills | Status: DC

## 2016-05-25 NOTE — Addendum Note (Signed)
Addended by: Steele Sizer F on: 05/25/2016 10:02 AM   Modules accepted: Orders

## 2016-05-25 NOTE — Progress Notes (Addendum)
Name: Charlene Hardy   MRN: [id number]    DOB: Dec 17, 1955   Date:05/25/2016       Progress Note  Subjective  Chief Complaint  Chief Complaint  Patient presents with  . Medication Refill    3 month F/U  . Hyperlipidemia  . Hypertension    Patient states she has bilateral edema  . Diabetes    Does not check sugar at home    HPI  DMII: taking Metformin, last eye exam no diabetic retinopathy in 2016, due for repeat. She has peripheral neuropathy - only lack of sensation - severe bilateral foot pain that is currently controlled with Gabapentin, she is still  seeing Podiatrist, Dr. Cannon Kettle. She is compliant with medication, denies polyphagia, polyuria or polydipsia. She has been swelling on both lower extremities, she has been unable to afford the compression stocking hoses. HgbA1C is at goal  Obesity: with multiple risk factors, also bilateral knee pain, she had lost some weight but is up 4 lbs agin, discussed life style modification, she is cooking twice a week and eats sandwiches other days of the week.   Bilateral knee arthralgias: switch from left to right intermittent knee pain. She has been on Meloxicam because of kidney function and now off Tramadol since started on Gabapentin, knee pain has improved, occasionally has effusion  Chronic bronchitis: She is still smoking and not ready to quit. She denies SOB but has a daily dry cough. She is using Combivent prn and seems to improve the cough, reviewed Spirometry and FEV1/FVC was normal, no improvement after albuterol.  HTN: she is taking medications as prescribed, bp is at goal today.No chest pain or palpitation. She also has lower extremity edema, she can't take HCTZ because it causes hypercalcemia.  Dyslipidemia: low HDL, also discussed that because of DM we will switch from Pravastatin to Atorvastatin  Hypothyroidism: last TSH was suppressed, she did not bring a bottle of thyroid medication, she is supposed to be taking 125 mcg  daily, we will recheck labs today. She has dry skin, but denies constipation, she denies fatigue  Leg swelling: started on Furosemide and potassium this Summer. . Symptoms have improved, only taking medication every other day. She did not get compression stocking hoses, no cramping, she has noticed increase in urinary frequency  Patient Active Problem List   Diagnosis Date Noted  . Primary hyperparathyroidism ([place]) 02/28/2016  . Arthralgia of both knees 10/23/2015  . Chronic bronchitis ([place]) 10/23/2015  . Morbid obesity ([place]) 10/23/2015  . Osteopenia 10/23/2015  . Left ankle pain 04/25/2015  . Constipation 04/25/2015  . Allergic rhinitis 01/20/2015  . 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/2015  . Complication of diabetes mellitus ([place]) 01/20/2015  . Diabetic sensorimotor neuropathy ([place]) 01/20/2015  . Dyslipidemia 01/20/2015  . Essential (primary) hypertension 01/20/2015  . H/O iron deficiency anemia 01/20/2015  . Hypertensive retinopathy 01/20/2015  . Adult hypothyroidism 01/20/2015  . Extreme obesity ([place]) 01/20/2015  . Background retinopathy due to secondary diabetes ([place]) 01/20/2015  . Tinea pedis 01/20/2015  . Vitamin D deficiency 02/26/2010  . Cigarette smoker 03/06/2008    Past Surgical History:  Procedure Laterality Date  . ABDOMINAL HYSTERECTOMY  08/11/1999   still has ovaries    Family History  Problem Relation Age of Onset  . Multiple sclerosis Mother   . Diabetes Father   . Breast cancer Sister     Social History   Social History  . Marital status: Single    Spouse name: N/A  . Number of children:  N/A  . Years of education: N/A   Occupational History  . Not on file.   Social History Main Topics  . Smoking status: Current Every Day Smoker    Packs/day: 1.00    Years: 37.00    Types: Cigarettes    Start date: 08/10/1976  . Smokeless tobacco: Never Used  . Alcohol use No  . Drug use: No  . Sexual activity: Not Currently   Other Topics Concern  . Not on  file   Social History Narrative  . No narrative on file     Current Outpatient Prescriptions:  .  acetaminophen (TYLENOL) 500 MG tablet, Take 1 tablet (500 mg total) by mouth 2 (two) times daily., Disp: 30 tablet, Rfl: 0 .  amLODipine-benazepril (LOTREL) 5-40 MG capsule, TAKE ONE CAPSULE BY MOUTH ONCE DAILY, Disp: 90 capsule, Rfl: 1 .  aspirin 81 MG tablet, Take 1 tablet by mouth daily., Disp: , Rfl:  .  Cholecalciferol (VITAMIN D) 2000 UNITS tablet, Take 1 tablet by mouth daily., Disp: , Rfl:  .  Elastic Bandages & Supports ([place]) MISC, 2 each by Does not apply route daily. For 12 hours on and off at night, Disp: 2 each, Rfl: 0 .  ferrous sulfate 325 (65 FE) MG tablet, Take 1 tablet by mouth daily., Disp: , Rfl:  .  furosemide (LASIX) 20 MG tablet, Take 1 tablet (20 mg total) by mouth daily., Disp: 90 tablet, Rfl: 1 .  gabapentin (NEURONTIN) 300 MG capsule, Take 1 capsule (300 mg total) by mouth 3 (three) times daily. Gradually go up , to a goal of 300 mg three times daily, Disp: 270 capsule, Rfl: 1 .  Ipratropium-Albuterol (COMBIVENT RESPIMAT) 20-100 MCG/ACT AERS respimat, Inhale 1 puff into the lungs every 6 (six) hours as needed for wheezing., Disp: 1 Inhaler, Rfl: 2 .  levothyroxine (SYNTHROID, LEVOTHROID) 125 MCG tablet, Take 1 tablet (125 mcg total) by mouth daily., Disp: 30 tablet, Rfl: 1 .  metFORMIN (GLUCOPHAGE) 500 MG tablet, Take 1 tablet (500 mg total) by mouth daily., Disp: 90 tablet, Rfl: 1 .  metoprolol (TOPROL-XL) 200 MG 24 hr tablet, , Disp: , Rfl: 1 .  MULTIPLE VITAMINS-MINERALS ER PO, Take 1 tablet by mouth daily., Disp: , Rfl:  .  Naftifine HCl 2 % CREA, Apply 1 g topically 2 (two) times daily., Disp: 60 g, Rfl: 1 .  omega-3 fish oil (MAXEPA) 1000 MG CAPS capsule, Take 1 capsule by mouth daily., Disp: , Rfl:  .  Polyethylene Glycol 3350 POWD, Take 1 Dose by mouth as needed., Disp: , Rfl:  .  potassium chloride SA (K-DUR,KLOR-CON) 20 MEQ tablet,  Take 1 tablet (20 mEq total) by mouth daily., Disp: 90 tablet, Rfl: 1 .  atorvastatin (LIPITOR) 40 MG tablet, Take 1 tablet (40 mg total) by mouth daily. In place of Pravastatin for cholesterol, Disp: 90 tablet, Rfl: 1  Allergies  Allergen Reactions  . Hydrochlorothiazide     hyperparathyroidism  . Pneumococcal Vaccine Rash  . Pneumovax [Pneumococcal Polysaccharide Vaccine]      ROS  Constitutional: Negative for fever , positive for mild weight change.  Respiratory: Positive  for cough, but no shortness of breath.   Cardiovascular: Negative for chest pain or palpitations.  Gastrointestinal: Negative for abdominal pain, no bowel changes.  Musculoskeletal: Negative for gait problem or joint swelling.  Skin: Negative for rash.  Neurological: Negative for dizziness or headache.  No other specific complaints in a complete review of systems (except  as listed in HPI above).   Objective  Vitals:   05/25/16 0931  BP: 132/76  Pulse: 82  Resp: 16  Temp: 98.2 F (36.8 C)  TempSrc: Oral  SpO2: 93%  Weight: 265 lb 6.4 oz (120.4 kg)  Height: '5\' 7"'  (1.702 m)    Body mass index is 41.57 kg/m.  Physical Exam  Constitutional: Patient appears well-developed and well-nourished. Obese No distress.  HEENT: head atraumatic, normocephalic, pupils equal and reactive to light, neck supple, throat within normal limits Cardiovascular: Normal rate, regular rhythm and normal heart sounds.  No murmur heard. 1 plus BLE edema. Pulmonary/Chest: Effort normal, rhonchi scattered. No respiratory distress. Abdominal: Soft.  There is no tenderness. Psychiatric: Patient has a normal mood and affect. behavior is normal. Judgment and thought content normal.  Recent Results (from the past 2160 hour(s))  Lipid panel     Status: Abnormal   Collection Time: 02/28/16 11:54 AM  Result Value Ref Range   Cholesterol 120 (L) 125 - 200 mg/dL   Triglycerides 139 <150 mg/dL   HDL 38 (L) >=46 mg/dL   Total  CHOL/HDL Ratio 3.2 <=5.0 Ratio   VLDL 28 <30 mg/dL   LDL Cholesterol 54 <130 mg/dL    Comment:   Total Cholesterol/HDL Ratio:CHD Risk                        Coronary Heart Disease Risk Table                                        Men       Women          1/2 Average Risk              3.4        3.3              Average Risk              5.0        4.4           2X Average Risk              9.6        7.1           3X Average Risk             23.4       11.0 Use the calculated Patient Ratio above and the CHD Risk table  to determine the patient's CHD Risk.   CBC with Differential/Platelet     Status: Abnormal   Collection Time: 02/28/16 11:54 AM  Result Value Ref Range   WBC 10.7 3.8 - 10.8 K/uL   RBC 5.31 (H) 3.80 - 5.10 MIL/uL   Hemoglobin 13.7 11.7 - 15.5 g/dL   HCT 41.8 35.0 - 45.0 %   MCV 78.7 (L) 80.0 - 100.0 fL   MCH 25.8 (L) 27.0 - 33.0 pg   MCHC 32.8 32.0 - 36.0 g/dL   RDW 15.4 (H) 11.0 - 15.0 %   Platelets 251 140 - 400 K/uL   MPV 11.0 7.5 - 12.5 fL   Neutro Abs 6,848 1,500 - 7,800 cells/uL   Lymphs Abs 2,675 850 - 3,900 cells/uL   Monocytes Absolute 856 200 - 950 cells/uL   Eosinophils Absolute 321 15 - 500 cells/uL   Basophils Absolute 0 0 -  200 cells/uL   Neutrophils Relative % 64 %   Lymphocytes Relative 25 %   Monocytes Relative 8 %   Eosinophils Relative 3 %   Basophils Relative 0 %   Smear Review Criteria for review not met     Comment: ** Please note change in unit of measure and reference range(s). **  COMPLETE METABOLIC PANEL WITH GFR     Status: None   Collection Time: 02/28/16 11:54 AM  Result Value Ref Range   Sodium 141 135 - 146 mmol/L   Potassium 4.7 3.5 - 5.3 mmol/L   Chloride 105 98 - 110 mmol/L   CO2 28 20 - 31 mmol/L   Glucose, Bld 80 65 - 99 mg/dL   BUN 10 7 - 25 mg/dL   Creat 0.84 0.50 - 0.99 mg/dL    Comment:   For patients > or = 60 years of age: The upper reference limit for Creatinine is approximately 13% higher for people  identified as African-American.      Total Bilirubin 0.3 0.2 - 1.2 mg/dL   Alkaline Phosphatase 104 33 - 130 U/L   AST 13 10 - 35 U/L   ALT 11 6 - 29 U/L   Total Protein 6.7 6.1 - 8.1 g/dL   Albumin 3.7 3.6 - 5.1 g/dL   Calcium 10.2 8.6 - 10.4 mg/dL   GFR, Est African American 87 >=60 mL/min   GFR, Est Non African American 76 >=60 mL/min  TSH     Status: Abnormal   Collection Time: 02/28/16 11:54 AM  Result Value Ref Range   TSH 0.36 (L) mIU/L    Comment:   Reference Range   > or = 20 Years  0.40-4.50   Pregnancy Range First trimester  0.26-2.66 Second trimester 0.55-2.73 Third trimester  0.43-2.91     POCT HgB A1C     Status: Normal   Collection Time: 05/25/16  9:37 AM  Result Value Ref Range   Hemoglobin A1C 5.8       PHQ2/9: Depression screen [facility] 2/9 05/25/2016 02/28/2016 01/27/2016 08/27/2015 07/26/2015  Decreased Interest 0 0 0 0 0  Down, Depressed, Hopeless 0 0 0 0 0  PHQ - 2 Score 0 0 0 0 0     Fall Risk: Fall Risk  05/25/2016 02/28/2016 01/27/2016 08/27/2015 07/26/2015  Falls in the past year? No No No No No      Functional Status Survey: Is the patient deaf or have difficulty hearing?: No Does the patient have difficulty seeing, even when wearing glasses/contacts?: No Does the patient have difficulty concentrating, remembering, or making decisions?: No Does the patient have difficulty walking or climbing stairs?: No Does the patient have difficulty dressing or bathing?: No Does the patient have difficulty doing errands alone such as visiting a doctor's office or shopping?: No    Assessment & Plan  1. Type 2 diabetes, controlled, with neuropathy (HCC)  - POCT HgB A1C - gabapentin (NEURONTIN) 300 MG capsule; Take 1 capsule (300 mg total) by mouth 3 (three) times daily. Gradually go up , to a goal of 300 mg three times daily  Dispense: 270 capsule; Refill: 1  2. Needs flu shot  - Flu Vaccine QUAD 36+ mos PF IM (Fluarix & Fluzone Quad PF)  3.  Dyslipidemia  - atorvastatin (LIPITOR) 40 MG tablet; Take 1 tablet (40 mg total) by mouth daily. In place of Pravastatin for cholesterol  Dispense: 90 tablet; Refill: 1  4. Essential (primary) hypertension  At goal,  continue medication   5. Other specified hypothyroidism  - TSH  6. Leukocytosis, unspecified type  - CBC with Differential/Platelet  7. Hypertensive retinopathy, unspecified laterality  Needs to   8. Cigarette smoker  Needs to quit smoking  9. Simple chronic bronchitis (HCC)  Using medication prn, still has daily cough  10. Bilateral edema of lower extremity  - furosemide (LASIX) 20 MG tablet; Take 1 tablet (20 mg total) by mouth daily.  Dispense: 90 tablet; Refill: 1 - potassium chloride SA (K-DUR,KLOR-CON) 20 MEQ tablet; Take 1 tablet (20 mEq total) by mouth daily.  Dispense: 90 tablet; Refill: 1  11. Colon cancer screening  - Ambulatory referral to Gastroenterology

## 2016-05-26 ENCOUNTER — Encounter: Payer: Self-pay | Admitting: *Deleted

## 2016-05-26 ENCOUNTER — Other Ambulatory Visit: Payer: Self-pay | Admitting: Family Medicine

## 2016-05-26 DIAGNOSIS — Z1231 Encounter for screening mammogram for malignant neoplasm of breast: Secondary | ICD-10-CM

## 2016-06-01 ENCOUNTER — Ambulatory Visit (INDEPENDENT_AMBULATORY_CARE_PROVIDER_SITE_OTHER): Payer: BLUE CROSS/BLUE SHIELD | Admitting: General Surgery

## 2016-06-01 ENCOUNTER — Encounter: Payer: Self-pay | Admitting: General Surgery

## 2016-06-01 VITALS — BP 130/74 | HR 72 | Resp 16 | Ht 67.0 in | Wt 265.0 lb

## 2016-06-01 DIAGNOSIS — Z1211 Encounter for screening for malignant neoplasm of colon: Secondary | ICD-10-CM

## 2016-06-01 MED ORDER — POLYETHYLENE GLYCOL 3350 17 GM/SCOOP PO POWD: ORAL | 0 refills | Status: DC

## 2016-06-01 NOTE — Patient Instructions (Addendum)

## 2016-06-01 NOTE — Progress Notes (Signed)
Patient ID: Charlene Hardy, female   DOB: 15-Feb-1956, 60 y.o.   MRN: [id number]  No chief complaint on file.   HPI Charlene Hardy is a 60 y.o. female here today for a evaluation of a screening colonoscopy. Patient states no GI problems at this time. Last colonoscopy 08/10/2005.  I have reviewed the history of present illness with the patient.  HPI  Past Medical History:  Diagnosis Date  . Arthritis   . Hyperlipidemia   . Hypertension   . Thyroid disease     Past Surgical History:  Procedure Laterality Date  . ABDOMINAL HYSTERECTOMY  08/11/1999   still has ovaries  . COLONOSCOPY  08/11/2015    Family History  Problem Relation Age of Onset  . Multiple sclerosis Mother   . Diabetes Father   . Breast cancer Sister     Social History Social History  Substance Use Topics  . Smoking status: Current Every Day Smoker    Packs/day: 1.00    Years: 37.00    Types: Cigarettes    Start date: 08/10/1976  . Smokeless tobacco: Never Used  . Alcohol use No    Allergies  Allergen Reactions  . Hydrochlorothiazide     hyperparathyroidism  . Pneumococcal Vaccine Rash  . Pneumovax [Pneumococcal Polysaccharide Vaccine]     Current Outpatient Prescriptions  Medication Sig Dispense Refill  . acetaminophen (TYLENOL) 500 MG tablet Take 1 tablet (500 mg total) by mouth 2 (two) times daily. 30 tablet 0  . amLODipine-benazepril (LOTREL) 5-40 MG capsule TAKE ONE CAPSULE BY MOUTH ONCE DAILY 90 capsule 1  . aspirin 81 MG tablet Take 1 tablet by mouth daily.    Marland Kitchen atorvastatin (LIPITOR) 40 MG tablet Take 1 tablet (40 mg total) by mouth daily. In place of Pravastatin for cholesterol 90 tablet 1  . Cholecalciferol (VITAMIN D) 2000 UNITS tablet Take 1 tablet by mouth daily.    Regino Schultze Bandages & Supports (MEDICAL COMPRESSION STOCKINGS) MISC 2 each by Does not apply route daily. For 12 hours on and off at night 2 each 0  . ferrous sulfate 325 (65 FE) MG tablet Take 1 tablet by mouth daily.    .  furosemide (LASIX) 20 MG tablet Take 1 tablet (20 mg total) by mouth daily. 90 tablet 1  . gabapentin (NEURONTIN) 300 MG capsule Take 1 capsule (300 mg total) by mouth 3 (three) times daily. Gradually go up , to a goal of 300 mg three times daily 270 capsule 1  . Ipratropium-Albuterol (COMBIVENT RESPIMAT) 20-100 MCG/ACT AERS respimat Inhale 1 puff into the lungs every 6 (six) hours as needed for wheezing. 1 Inhaler 2  . levothyroxine (SYNTHROID, LEVOTHROID) 125 MCG tablet Take 1 tablet (125 mcg total) by mouth daily. 30 tablet 1  . metFORMIN (GLUCOPHAGE) 500 MG tablet Take 1 tablet (500 mg total) by mouth daily. 90 tablet 1  . metoprolol (TOPROL-XL) 200 MG 24 hr tablet   1  . MULTIPLE VITAMINS-MINERALS ER PO Take 1 tablet by mouth daily.    . Naftifine HCl 2 % CREA Apply 1 g topically 2 (two) times daily. 60 g 1  . omega-3 fish oil (MAXEPA) 1000 MG CAPS capsule Take 1 capsule by mouth daily.    . Polyethylene Glycol 3350 POWD Take 1 Dose by mouth as needed.    . potassium chloride SA (K-DUR,KLOR-CON) 20 MEQ tablet Take 1 tablet (20 mEq total) by mouth daily. 90 tablet 1  . polyethylene glycol powder (GLYCOLAX/MIRALAX) powder 255 grams one  bottle for colonoscopy prep 255 g 0   No current facility-administered medications for this visit.     Review of Systems Review of Systems  Constitutional: Negative.   Respiratory: Negative.   Cardiovascular: Negative.     Blood pressure 130/74, pulse 72, resp. rate 16, height 5\' 7"  (1.702 m), weight 265 lb (120.2 kg).  Physical Exam Physical Exam  Constitutional: She is oriented to person, place, and time. She appears well-developed and well-nourished.  Eyes: Conjunctivae are normal. No scleral icterus.  Neck: Neck supple.  Cardiovascular: Normal rate, regular rhythm and normal heart sounds.   Pulmonary/Chest: Effort normal and breath sounds normal.  Abdominal: Soft. Bowel sounds are normal. She exhibits no mass. There is no tenderness. No hernia.   Lymphadenopathy:    She has no cervical adenopathy.  Neurological: She is alert and oriented to person, place, and time.  Skin: Skin is warm and dry.    Data Reviewed Notes reviewed   Assessment    Stable exam. Needs screening colonoscopy. Pt is agreeable to this    Plan   Colonoscopy with possible biopsy/polypectomy prn: Information regarding the procedure, including its potential risks and complications (including but not limited to perforation of the bowel, which may require emergency surgery to repair, and bleeding) was verbally given to the patient. Educational information regarding lower intestinal endoscopy was given to the patient. Written instructions for how to complete the bowel prep using Miralax were provided. The importance of drinking ample fluids to avoid dehydration as a result of the prep emphasized.  Patient has been scheduled for a colonoscopy on 06-10-16 at [facility]. This patient will hold Metformin day of prep and procedure. It is okay for patient to continue 81 mg aspirin once daily.   This information has been scribed by Gaspar Cola CMA.        Tal Kempker G 06/02/2016, 3:05 PM

## 2016-06-02 ENCOUNTER — Encounter: Payer: Self-pay | Admitting: General Surgery

## 2016-06-10 ENCOUNTER — Encounter
Admission: RE | Disposition: A | Discharge status: Home / Self Care | Payer: Self-pay | Source: Ambulatory Visit | Attending: General Surgery

## 2016-06-10 ENCOUNTER — Ambulatory Visit: Payer: BLUE CROSS/BLUE SHIELD | Admitting: Anesthesiology

## 2016-06-10 ENCOUNTER — Ambulatory Visit
Admission: RE | Admit: 2016-06-10 | Discharge: 2016-06-10 | Disposition: A | Discharge status: Home / Self Care | Payer: BLUE CROSS/BLUE SHIELD | Source: Ambulatory Visit | Attending: General Surgery | Admitting: General Surgery

## 2016-06-10 ENCOUNTER — Encounter: Payer: Self-pay | Admitting: *Deleted

## 2016-06-10 DIAGNOSIS — D125 Benign neoplasm of sigmoid colon: Secondary | ICD-10-CM | POA: Insufficient documentation

## 2016-06-10 DIAGNOSIS — F17210 Nicotine dependence, cigarettes, uncomplicated: Secondary | ICD-10-CM | POA: Insufficient documentation

## 2016-06-10 DIAGNOSIS — Z6841 Body Mass Index (BMI) 40.0 and over, adult: Secondary | ICD-10-CM | POA: Diagnosis not present

## 2016-06-10 DIAGNOSIS — Z79899 Other long term (current) drug therapy: Secondary | ICD-10-CM | POA: Insufficient documentation

## 2016-06-10 DIAGNOSIS — K635 Polyp of colon: Secondary | ICD-10-CM | POA: Diagnosis not present

## 2016-06-10 DIAGNOSIS — I10 Essential (primary) hypertension: Secondary | ICD-10-CM | POA: Insufficient documentation

## 2016-06-10 DIAGNOSIS — E669 Obesity, unspecified: Secondary | ICD-10-CM | POA: Insufficient documentation

## 2016-06-10 DIAGNOSIS — E785 Hyperlipidemia, unspecified: Secondary | ICD-10-CM | POA: Insufficient documentation

## 2016-06-10 DIAGNOSIS — E079 Disorder of thyroid, unspecified: Secondary | ICD-10-CM | POA: Insufficient documentation

## 2016-06-10 DIAGNOSIS — Z7984 Long term (current) use of oral hypoglycemic drugs: Secondary | ICD-10-CM | POA: Insufficient documentation

## 2016-06-10 DIAGNOSIS — M1990 Unspecified osteoarthritis, unspecified site: Secondary | ICD-10-CM | POA: Insufficient documentation

## 2016-06-10 DIAGNOSIS — Z1211 Encounter for screening for malignant neoplasm of colon: Secondary | ICD-10-CM | POA: Insufficient documentation

## 2016-06-10 DIAGNOSIS — Z7982 Long term (current) use of aspirin: Secondary | ICD-10-CM | POA: Diagnosis not present

## 2016-06-10 DIAGNOSIS — E119 Type 2 diabetes mellitus without complications: Secondary | ICD-10-CM | POA: Diagnosis not present

## 2016-06-10 HISTORY — DX: Type 2 diabetes mellitus without complications: E11.9

## 2016-06-10 HISTORY — PX: COLONOSCOPY WITH PROPOFOL: SHX5780

## 2016-06-10 LAB — GLUCOSE, CAPILLARY: GLUCOSE-CAPILLARY: 85 mg/dL (ref 65–99)

## 2016-06-10 SURGERY — COLONOSCOPY WITH PROPOFOL
Anesthesia: General

## 2016-06-10 MED ORDER — PROPOFOL 10 MG/ML IV BOLUS
INTRAVENOUS | Status: DC | PRN
  Administered 2016-06-10: 100 mg via INTRAVENOUS

## 2016-06-10 MED ORDER — PROPOFOL 500 MG/50ML IV EMUL
INTRAVENOUS | Status: DC | PRN
  Administered 2016-06-10: 140 ug/kg/min via INTRAVENOUS

## 2016-06-10 MED ORDER — SODIUM CHLORIDE 0.9 % IV SOLN
INTRAVENOUS | Status: DC
  Administered 2016-06-10: 1000 mL via INTRAVENOUS

## 2016-06-10 MED ORDER — LIDOCAINE HCL (PF) 1 % IJ SOLN
2.0000 mL | Freq: Once | INTRAMUSCULAR | Status: AC
  Administered 2016-06-10: 0.1 mL via INTRADERMAL
  Filled 2016-06-10: qty 2

## 2016-06-10 NOTE — Anesthesia Postprocedure Evaluation (Signed)
Anesthesia Post Note  Patient: Charlene Hardy  Procedure(s) Performed: Procedure(s) (LRB): COLONOSCOPY WITH PROPOFOL (N/A)  Patient location during evaluation: Endoscopy Anesthesia Type: General Level of consciousness: awake and alert and oriented Pain management: pain level controlled Vital Signs Assessment: post-procedure vital signs reviewed and stable Respiratory status: spontaneous breathing, nonlabored ventilation and respiratory function stable Cardiovascular status: blood pressure returned to baseline and stable Postop Assessment: no signs of nausea or vomiting Anesthetic complications: no    Last Vitals:  Vitals:   06/10/16 0840 06/10/16 0850  BP: 132/76 132/76  Pulse: 69 68  Resp: (!) 23 18  Temp: 36.3 C     Last Pain:  Vitals:   06/10/16 0840  TempSrc: Tympanic                 Mirka Barbone

## 2016-06-10 NOTE — H&P (View-Only) (Signed)
Patient ID: Charlene Hardy, female   DOB: Sep 22, 1955, 60 y.o.   MRN: [id number]  No chief complaint on file.   HPI Charlene Hardy is a 60 y.o. female here today for a evaluation of a screening colonoscopy. Patient states no GI problems at this time. Last colonoscopy 08/10/2005.  I have reviewed the history of present illness with the patient.  HPI  Past Medical History:  Diagnosis Date  . Arthritis   . Hyperlipidemia   . Hypertension   . Thyroid disease     Past Surgical History:  Procedure Laterality Date  . ABDOMINAL HYSTERECTOMY  08/11/1999   still has ovaries  . COLONOSCOPY  08/11/2015    Family History  Problem Relation Age of Onset  . Multiple sclerosis Mother   . Diabetes Father   . Breast cancer Sister     Social History Social History  Substance Use Topics  . Smoking status: Current Every Day Smoker    Packs/day: 1.00    Years: 37.00    Types: Cigarettes    Start date: 08/10/1976  . Smokeless tobacco: Never Used  . Alcohol use No    Allergies  Allergen Reactions  . Hydrochlorothiazide     hyperparathyroidism  . Pneumococcal Vaccine Rash  . Pneumovax [Pneumococcal Polysaccharide Vaccine]     Current Outpatient Prescriptions  Medication Sig Dispense Refill  . acetaminophen (TYLENOL) 500 MG tablet Take 1 tablet (500 mg total) by mouth 2 (two) times daily. 30 tablet 0  . amLODipine-benazepril (LOTREL) 5-40 MG capsule TAKE ONE CAPSULE BY MOUTH ONCE DAILY 90 capsule 1  . aspirin 81 MG tablet Take 1 tablet by mouth daily.    Marland Kitchen atorvastatin (LIPITOR) 40 MG tablet Take 1 tablet (40 mg total) by mouth daily. In place of Pravastatin for cholesterol 90 tablet 1  . Cholecalciferol (VITAMIN D) 2000 UNITS tablet Take 1 tablet by mouth daily.    Regino Schultze Bandages & Supports (MEDICAL COMPRESSION STOCKINGS) MISC 2 each by Does not apply route daily. For 12 hours on and off at night 2 each 0  . ferrous sulfate 325 (65 FE) MG tablet Take 1 tablet by mouth daily.    .  furosemide (LASIX) 20 MG tablet Take 1 tablet (20 mg total) by mouth daily. 90 tablet 1  . gabapentin (NEURONTIN) 300 MG capsule Take 1 capsule (300 mg total) by mouth 3 (three) times daily. Gradually go up , to a goal of 300 mg three times daily 270 capsule 1  . Ipratropium-Albuterol (COMBIVENT RESPIMAT) 20-100 MCG/ACT AERS respimat Inhale 1 puff into the lungs every 6 (six) hours as needed for wheezing. 1 Inhaler 2  . levothyroxine (SYNTHROID, LEVOTHROID) 125 MCG tablet Take 1 tablet (125 mcg total) by mouth daily. 30 tablet 1  . metFORMIN (GLUCOPHAGE) 500 MG tablet Take 1 tablet (500 mg total) by mouth daily. 90 tablet 1  . metoprolol (TOPROL-XL) 200 MG 24 hr tablet   1  . MULTIPLE VITAMINS-MINERALS ER PO Take 1 tablet by mouth daily.    . Naftifine HCl 2 % CREA Apply 1 g topically 2 (two) times daily. 60 g 1  . omega-3 fish oil (MAXEPA) 1000 MG CAPS capsule Take 1 capsule by mouth daily.    . Polyethylene Glycol 3350 POWD Take 1 Dose by mouth as needed.    . potassium chloride SA (K-DUR,KLOR-CON) 20 MEQ tablet Take 1 tablet (20 mEq total) by mouth daily. 90 tablet 1  . polyethylene glycol powder (GLYCOLAX/MIRALAX) powder 255 grams one  bottle for colonoscopy prep 255 g 0   No current facility-administered medications for this visit.     Review of Systems Review of Systems  Constitutional: Negative.   Respiratory: Negative.   Cardiovascular: Negative.     Blood pressure 130/74, pulse 72, resp. rate 16, height 5\' 7"  (1.702 m), weight 265 lb (120.2 kg).  Physical Exam Physical Exam  Constitutional: She is oriented to person, place, and time. She appears well-developed and well-nourished.  Eyes: Conjunctivae are normal. No scleral icterus.  Neck: Neck supple.  Cardiovascular: Normal rate, regular rhythm and normal heart sounds.   Pulmonary/Chest: Effort normal and breath sounds normal.  Abdominal: Soft. Bowel sounds are normal. She exhibits no mass. There is no tenderness. No hernia.   Lymphadenopathy:    She has no cervical adenopathy.  Neurological: She is alert and oriented to person, place, and time.  Skin: Skin is warm and dry.    Data Reviewed Notes reviewed   Assessment    Stable exam. Needs screening colonoscopy. Pt is agreeable to this    Plan   Colonoscopy with possible biopsy/polypectomy prn: Information regarding the procedure, including its potential risks and complications (including but not limited to perforation of the bowel, which may require emergency surgery to repair, and bleeding) was verbally given to the patient. Educational information regarding lower intestinal endoscopy was given to the patient. Written instructions for how to complete the bowel prep using Miralax were provided. The importance of drinking ample fluids to avoid dehydration as a result of the prep emphasized.  Patient has been scheduled for a colonoscopy on 06-10-16 at [facility]. This patient will hold Metformin day of prep and procedure. It is okay for patient to continue 81 mg aspirin once daily.   This information has been scribed by Gaspar Cola CMA.        SANKAR,SEEPLAPUTHUR G 06/02/2016, 3:05 PM

## 2016-06-10 NOTE — Op Note (Signed)
[facility] Gastroenterology Patient Name: Charlene Hardy Procedure Date: 06/10/2016 8:04 AM MRN: [id number] Account #: [id number] Date of Birth: 07/08/1956 Admit Type: Outpatient Age: 60 Room: [facility] ENDO ROOM 1 Gender: Female Note Status: Finalized Procedure:            Colonoscopy Indications:          Screening for colorectal malignant neoplasm Providers:            Comfort Iversen G. Jamal Collin, MD Referring MD:         Bethena Roys. Sowles, MD (Referring MD) Medicines:            General Anesthesia Complications:        No immediate complications. Procedure:            Pre-Anesthesia Assessment:                       - General anesthesia under the supervision of an                        anesthesiologist was determined to be medically                        necessary for this procedure based on review of the                        patient's medical history, medications, and prior                        anesthesia history.                       After obtaining informed consent, the colonoscope was                        passed under direct vision. Throughout the procedure,                        the patient's blood pressure, pulse, and oxygen                        saturations were monitored continuously. The                        Colonoscope was introduced through the anus and                        advanced to the the cecum, identified by the ileocecal                        valve. The colonoscopy was performed without                        difficulty. The patient tolerated the procedure well.                        The quality of the bowel preparation was fair. Findings:      The perianal and digital rectal examinations were normal.      A moderate amount of stool was found in the cecum, interfering with       visualization. Lavage of the area was performed, resulting in incomplete  clearance with continued poor visualization.      A 3 mm polyp was found in  the distal sigmoid colon. The polyp was       sessile. The polyp was removed with a cold biopsy forceps. Resection and       retrieval were complete.      The exam was otherwise without abnormality on direct and retroflexion       views. Impression:           - Preparation of the colon was fair.                       - Stool in the cecum.                       - One 3 mm polyp in the distal sigmoid colon, removed                        with a cold biopsy forceps. Resected and retrieved.                       - The examination was otherwise normal on direct and                        retroflexion views. Recommendation:       - Discharge patient to home.                       - Resume previous diet.                       - Await pathology results. Procedure Code(s):    --- Professional ---                       [phone], Colonoscopy, flexible; with biopsy, single or                        multiple Diagnosis Code(s):    --- Professional ---                       Z12.11, Encounter for screening for malignant neoplasm                        of colon                       D12.5, Benign neoplasm of sigmoid colon CPT copyright 2016 American [employer] Association. All rights reserved. The codes documented in this report are preliminary and upon coder review may  be revised to meet current compliance requirements. Christene Lye, MD 06/10/2016 8:48:26 AM This report has been signed electronically. Number of Addenda: 0 Note Initiated On: 06/10/2016 8:04 AM Scope Withdrawal Time: 0 hours 16 minutes 8 seconds  Total Procedure Duration: 0 hours 33 minutes 58 seconds       [facility]

## 2016-06-10 NOTE — Transfer of Care (Signed)
Immediate Anesthesia Transfer of Care Note  Patient: Charlene Hardy  Procedure(s) Performed: Procedure(s): COLONOSCOPY WITH PROPOFOL (N/A)  Patient Location: PACU and Endoscopy Unit  Anesthesia Type:General  Level of Consciousness: patient cooperative and lethargic  Airway & Oxygen Therapy: Patient Spontanous Breathing and Patient connected to nasal cannula oxygen  Post-op Assessment: Report given to RN and Post -op Vital signs reviewed and stable  Post vital signs: Reviewed and stable  Last Vitals:  Vitals:   06/10/16 0840 06/10/16 0850  BP: 132/76 132/76  Pulse: 69 68  Resp: (!) 23 18  Temp: 36.3 C     Last Pain:  Vitals:   06/10/16 0840  TempSrc: Tympanic         Complications: No apparent anesthesia complications

## 2016-06-10 NOTE — Anesthesia Preprocedure Evaluation (Signed)
Anesthesia Evaluation  Patient identified by MRN, date of birth, ID band Patient awake    Reviewed: Allergy & Precautions, NPO status , Patient's Chart, lab work & pertinent test results, reviewed documented beta blocker date and time   History of Anesthesia Complications Negative for: history of anesthetic complications  Airway Mallampati: III  TM Distance: >3 FB Neck ROM: Full    Dental  (+) Poor Dentition, Missing, Chipped   Pulmonary neg sleep apnea, neg COPD, Current Smoker,    breath sounds clear to auscultation- rhonchi (-) wheezing      Cardiovascular Exercise Tolerance: Good hypertension, Pt. on medications and Pt. on home beta blockers (-) CAD and (-) Past MI  Rhythm:Regular Rate:Normal - Systolic murmurs and - Diastolic murmurs    Neuro/Psych negative psych ROS   GI/Hepatic negative GI ROS, Neg liver ROS,   Endo/Other  diabetes, Type 2, Oral Hypoglycemic AgentsHypothyroidism   Renal/GU negative Renal ROS     Musculoskeletal  (+) Arthritis ,   Abdominal (+) + obese,   Peds  Hematology negative hematology ROS (+)   Anesthesia Other Findings Past Medical History: No date: Arthritis No date: Diabetes mellitus without complication (HCC) No date: Hyperlipidemia No date: Hypertension No date: Thyroid disease   Reproductive/Obstetrics                             Anesthesia Physical Anesthesia Plan  ASA: III  Anesthesia Plan: General   Post-op Pain Management:    Induction: Intravenous  Airway Management Planned: Natural Airway  Additional Equipment:   Intra-op Plan:   Post-operative Plan:   Informed Consent: I have reviewed the patients History and Physical, chart, labs and discussed the procedure including the risks, benefits and alternatives for the proposed anesthesia with the patient or authorized representative who has indicated his/her understanding and  acceptance.   Dental advisory given  Plan Discussed with: CRNA and Anesthesiologist  Anesthesia Plan Comments:         Anesthesia Quick Evaluation

## 2016-06-10 NOTE — Interval H&P Note (Signed)
History and Physical Interval Note:  06/10/2016 7:56 AM  Charlene Hardy  has presented today for surgery, with the diagnosis of SCREENING  The various methods of treatment have been discussed with the patient and family. After consideration of risks, benefits and other options for treatment, the patient has consented to  Procedure(s): COLONOSCOPY WITH PROPOFOL (N/A) as a surgical intervention .  The patient's history has been reviewed, patient examined, no change in status, stable for surgery.  I have reviewed the patient's chart and labs.  Questions were answered to the patient's satisfaction.     SANKAR,SEEPLAPUTHUR G

## 2016-06-11 ENCOUNTER — Encounter: Payer: Self-pay | Admitting: General Surgery

## 2016-06-11 ENCOUNTER — Other Ambulatory Visit: Payer: Self-pay | Admitting: Family Medicine

## 2016-06-11 LAB — SURGICAL PATHOLOGY

## 2016-06-11 NOTE — Telephone Encounter (Signed)
Patient requesting refill of Levothyroxine to [facility]. 

## 2016-06-25 DIAGNOSIS — H52223 Regular astigmatism, bilateral: Secondary | ICD-10-CM | POA: Diagnosis not present

## 2016-06-25 DIAGNOSIS — H5203 Hypermetropia, bilateral: Secondary | ICD-10-CM | POA: Diagnosis not present

## 2016-06-25 DIAGNOSIS — H524 Presbyopia: Secondary | ICD-10-CM | POA: Diagnosis not present

## 2016-06-30 ENCOUNTER — Ambulatory Visit
Admission: RE | Admit: 2016-06-30 | Discharge: 2016-06-30 | Disposition: A | Discharge status: Home / Self Care | Payer: BLUE CROSS/BLUE SHIELD | Source: Ambulatory Visit | Attending: Family Medicine | Admitting: Family Medicine

## 2016-06-30 DIAGNOSIS — Z1231 Encounter for screening mammogram for malignant neoplasm of breast: Secondary | ICD-10-CM | POA: Diagnosis not present

## 2016-07-10 DIAGNOSIS — H5210 Myopia, unspecified eye: Secondary | ICD-10-CM | POA: Diagnosis not present

## 2016-08-07 ENCOUNTER — Other Ambulatory Visit: Payer: Self-pay

## 2016-08-07 ENCOUNTER — Telehealth: Payer: Self-pay

## 2016-08-07 DIAGNOSIS — E210 Primary hyperparathyroidism: Secondary | ICD-10-CM

## 2016-08-07 NOTE — Telephone Encounter (Signed)
Mark from [employer] [facility] on [address] called stating they needed a approval for changing manufacturer for patient thyroid medication. Changing from Sandoz to Lanette per Dr. Ancil Boozer ok to change but patient will need a thyroid blood test in 6 weeks after starting new manufacturer. Informed patient and mailed lab slip.

## 2016-08-26 ENCOUNTER — Ambulatory Visit: Payer: BLUE CROSS/BLUE SHIELD | Admitting: Family Medicine

## 2016-09-07 ENCOUNTER — Other Ambulatory Visit: Payer: Self-pay | Admitting: Family Medicine

## 2016-09-14 DIAGNOSIS — E039 Hypothyroidism, unspecified: Secondary | ICD-10-CM | POA: Diagnosis not present

## 2016-09-14 DIAGNOSIS — E210 Primary hyperparathyroidism: Secondary | ICD-10-CM | POA: Diagnosis not present

## 2016-09-14 DIAGNOSIS — M85839 Other specified disorders of bone density and structure, unspecified forearm: Secondary | ICD-10-CM | POA: Diagnosis not present

## 2016-09-15 ENCOUNTER — Ambulatory Visit (INDEPENDENT_AMBULATORY_CARE_PROVIDER_SITE_OTHER): Payer: BLUE CROSS/BLUE SHIELD | Admitting: Family Medicine

## 2016-09-15 ENCOUNTER — Other Ambulatory Visit: Payer: Self-pay | Admitting: Family Medicine

## 2016-09-15 ENCOUNTER — Encounter: Payer: Self-pay | Admitting: Family Medicine

## 2016-09-15 VITALS — BP 118/78 | HR 78 | Temp 97.9°F | Resp 18 | Ht 67.0 in | Wt 268.0 lb

## 2016-09-15 DIAGNOSIS — E1142 Type 2 diabetes mellitus with diabetic polyneuropathy: Secondary | ICD-10-CM

## 2016-09-15 DIAGNOSIS — I10 Essential (primary) hypertension: Secondary | ICD-10-CM

## 2016-09-15 DIAGNOSIS — E13319 Other specified diabetes mellitus with unspecified diabetic retinopathy without macular edema: Secondary | ICD-10-CM

## 2016-09-15 DIAGNOSIS — R600 Localized edema: Secondary | ICD-10-CM

## 2016-09-15 DIAGNOSIS — E210 Primary hyperparathyroidism: Secondary | ICD-10-CM | POA: Diagnosis not present

## 2016-09-15 DIAGNOSIS — E785 Hyperlipidemia, unspecified: Secondary | ICD-10-CM

## 2016-09-15 DIAGNOSIS — E039 Hypothyroidism, unspecified: Secondary | ICD-10-CM | POA: Diagnosis not present

## 2016-09-15 DIAGNOSIS — J411 Mucopurulent chronic bronchitis: Secondary | ICD-10-CM | POA: Diagnosis not present

## 2016-09-15 DIAGNOSIS — B353 Tinea pedis: Secondary | ICD-10-CM | POA: Diagnosis not present

## 2016-09-15 DIAGNOSIS — H35039 Hypertensive retinopathy, unspecified eye: Secondary | ICD-10-CM

## 2016-09-15 DIAGNOSIS — E1169 Type 2 diabetes mellitus with other specified complication: Secondary | ICD-10-CM | POA: Diagnosis not present

## 2016-09-15 DIAGNOSIS — E133299 Other specified diabetes mellitus with mild nonproliferative diabetic retinopathy without macular edema, unspecified eye: Secondary | ICD-10-CM

## 2016-09-15 DIAGNOSIS — E668 Other obesity: Secondary | ICD-10-CM

## 2016-09-15 LAB — POCT GLYCOSYLATED HEMOGLOBIN (HGB A1C): HEMOGLOBIN A1C: 6

## 2016-09-15 MED ORDER — DULAGLUTIDE 1.5 MG/0.5ML ~~LOC~~ SOAJ: 1.5000 mg | SUBCUTANEOUS | 2 refills | Status: DC

## 2016-09-15 MED ORDER — ATORVASTATIN CALCIUM 40 MG PO TABS: 40.0000 mg | ORAL_TABLET | Freq: Every day | ORAL | 1 refills | Status: DC

## 2016-09-15 MED ORDER — GABAPENTIN 300 MG PO CAPS: 300.0000 mg | ORAL_CAPSULE | Freq: Three times a day (TID) | ORAL | 1 refills | Status: DC

## 2016-09-15 MED ORDER — AMLODIPINE BESY-BENAZEPRIL HCL 5-40 MG PO CAPS: 1.0000 | ORAL_CAPSULE | Freq: Every day | ORAL | 1 refills | Status: DC

## 2016-09-15 MED ORDER — POTASSIUM CHLORIDE CRYS ER 20 MEQ PO TBCR: 20.0000 meq | EXTENDED_RELEASE_TABLET | Freq: Every day | ORAL | 1 refills | Status: DC

## 2016-09-15 MED ORDER — TERBINAFINE HCL 250 MG PO TABS: 250.0000 mg | ORAL_TABLET | Freq: Every day | ORAL | 0 refills | Status: DC

## 2016-09-15 MED ORDER — METOPROLOL SUCCINATE ER 200 MG PO TB24: 200.0000 mg | ORAL_TABLET | Freq: Every day | ORAL | 1 refills | Status: DC

## 2016-09-15 MED ORDER — FUROSEMIDE 20 MG PO TABS: 20.0000 mg | ORAL_TABLET | Freq: Every day | ORAL | 1 refills | Status: DC

## 2016-09-15 NOTE — Progress Notes (Signed)
Name: Charlene Hardy   MRN: [id number]    DOB: [birth year]   Date:09/15/2016       Progress Note  Subjective  Chief Complaint  Chief Complaint  Patient presents with  . Medication Refill    3 month F/U  . Diabetes  . Hypertension  . Hyperlipidemia  . Hypothyroidism  . Obesity  . Knee Pain    HPI  DMII: taking Metformin, but discussed other options and we will try Trulicity ( if not covered by insurance we will try Bydureon - she wants a once a week injection ), last eye exam no diabetic retinopathy in 2017She has peripheral neuropathy - only lack of sensation - severe bilateral foot pain that is currently controlled with Gabapentin She is compliant with medication, denies polyphagia, polyuria but she has  Polydipsia ( we will give her a note to have water near her at work ).  HgbA1C is at goal  Obesity: with multiple risk factors, also bilateral knee pain, she had lost some weight but is up 3 lbs again, discussed life style modification, she is cooking twice a week and eats sandwiches other days of the week, but still drinks sodas at work .   Bilateral knee arthralgias: switch from left to right intermittent knee pain. She has been on Meloxicam because of kidney function and is taking Tylenol and Gabapentin.  knee pain has improved, occasionally has effusion  Chronic bronchitis: She is still smoking and not ready to quit. She denies SOB but has a daily dry cough. She is using Combivent prn and states cough has improved, reviewed Spirometry and FEV1/FVC was normal, no improvement after albuterol.  HTN: she is taking medications as prescribed, bp is at goal today. No chest pain or palpitation. She also has lower extremity edema, she can't take HCTZ because it causes hypercalcemia but is doing well on lasix .  Dyslipidemia: low HDL, on Atorvastatin and otc fish oil  Hypothyroidism: last TSH was suppressed, she did not bring a bottle of thyroid medication, she is supposed to be taking  125 mcg daily She has dry skin, but denies constipation, she denies fatigue  Hyperparathyroidism: seeing Endocrinologist, will have a KUB, explained importance of drinking water to decrease risk of kidney stones  Tinea Pedis: she has pilling skin and dark skin on both feet and between toes, going on for many months, but getting worse, topical did not work   Patient Active Problem List   Diagnosis Date Noted  . Primary hyperparathyroidism ([place]) 02/28/2016  . Arthralgia of both knees 10/23/2015  . Chronic bronchitis ([place]) 10/23/2015  . Osteopenia 10/23/2015  . Left ankle pain 04/25/2015  . Constipation 04/25/2015  . Allergic rhinitis 01/20/2015  . 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/2015  . Diabetic sensorimotor neuropathy ([place]) 01/20/2015  . Dyslipidemia 01/20/2015  . Essential (primary) hypertension 01/20/2015  . H/O iron deficiency anemia 01/20/2015  . Hypertensive retinopathy 01/20/2015  . Adult hypothyroidism 01/20/2015  . Extreme obesity ([place]) 01/20/2015  . Background retinopathy due to secondary diabetes ([place]) 01/20/2015  . Tinea pedis 01/20/2015  . Vitamin D deficiency 02/26/2010  . Cigarette smoker 03/06/2008    Past Surgical History:  Procedure Laterality Date  . ABDOMINAL HYSTERECTOMY  08/11/1999   still has ovaries  . COLONOSCOPY  08/11/2015  . COLONOSCOPY WITH PROPOFOL N/A 06/10/2016   Procedure: COLONOSCOPY WITH PROPOFOL;  Surgeon: Christene Lye, MD;  Location: [facility] ENDOSCOPY;  Service: Endoscopy;  Laterality: N/A;    Family History  Problem Relation Age of Onset  .  Multiple sclerosis Mother   . Diabetes Father   . Breast cancer Sister 91    Social History   Social History  . Marital status: Single    Spouse name: N/A  . Number of children: N/A  . Years of education: N/A   Occupational History  . Not on file.   Social History Main Topics  . Smoking status: Current Every Day Smoker    Packs/day: 1.00    Years: 37.00    Types: Cigarettes     Start date: 08/10/1976  . Smokeless tobacco: Never Used  . Alcohol use No  . Drug use: No  . Sexual activity: Not Currently   Other Topics Concern  . Not on file   Social History Narrative  . No narrative on file     Current Outpatient Prescriptions:  .  acetaminophen (TYLENOL) 500 MG tablet, Take 1 tablet (500 mg total) by mouth 2 (two) times daily., Disp: 30 tablet, Rfl: 0 .  amLODipine-benazepril (LOTREL) 5-40 MG capsule, Take 1 capsule by mouth daily., Disp: 90 capsule, Rfl: 1 .  aspirin 81 MG tablet, Take 1 tablet by mouth daily., Disp: , Rfl:  .  atorvastatin (LIPITOR) 40 MG tablet, Take 1 tablet (40 mg total) by mouth daily. In place of Pravastatin for cholesterol, Disp: 90 tablet, Rfl: 1 .  Cholecalciferol (VITAMIN D) 2000 UNITS tablet, Take 1 tablet by mouth daily., Disp: , Rfl:  .  Dulaglutide (TRULICITY) 1.5 0000000 SOPN, Inject 1.5 mg into the skin once a week., Disp: 4 pen, Rfl: 2 .  Elastic Bandages & Supports (MEDICAL COMPRESSION STOCKINGS) MISC, 2 each by Does not apply route daily. For 12 hours on and off at night, Disp: 2 each, Rfl: 0 .  ferrous sulfate 325 (65 FE) MG tablet, Take 1 tablet by mouth daily., Disp: , Rfl:  .  furosemide (LASIX) 20 MG tablet, Take 1 tablet (20 mg total) by mouth daily., Disp: 90 tablet, Rfl: 1 .  gabapentin (NEURONTIN) 300 MG capsule, Take 1 capsule (300 mg total) by mouth 3 (three) times daily. Gradually go up , to a goal of 300 mg three times daily, Disp: 270 capsule, Rfl: 1 .  Ipratropium-Albuterol (COMBIVENT RESPIMAT) 20-100 MCG/ACT AERS respimat, Inhale 1 puff into the lungs every 6 (six) hours as needed for wheezing., Disp: 1 Inhaler, Rfl: 2 .  levothyroxine (SYNTHROID, LEVOTHROID) 125 MCG tablet, TAKE ONE TABLET BY MOUTH ONCE DAILY, Disp: 30 tablet, Rfl: 0 .  metoprolol (TOPROL-XL) 200 MG 24 hr tablet, Take 1 tablet (200 mg total) by mouth daily., Disp: 90 tablet, Rfl: 1 .  MULTIPLE VITAMINS-MINERALS ER PO, Take 1 tablet by mouth  daily., Disp: , Rfl:  .  omega-3 fish oil (MAXEPA) 1000 MG CAPS capsule, Take 1 capsule by mouth daily., Disp: , Rfl:  .  Polyethylene Glycol 3350 POWD, Take 1 Dose by mouth as needed., Disp: , Rfl:  .  polyethylene glycol powder (GLYCOLAX/MIRALAX) powder, 255 grams one bottle for colonoscopy prep, Disp: 255 g, Rfl: 0 .  potassium chloride SA (K-DUR,KLOR-CON) 20 MEQ tablet, Take 1 tablet (20 mEq total) by mouth daily., Disp: 90 tablet, Rfl: 1 .  terbinafine (LAMISIL) 250 MG tablet, Take 1 tablet (250 mg total) by mouth daily., Disp: 14 tablet, Rfl: 0  Allergies  Allergen Reactions  . Hydrochlorothiazide     hyperparathyroidism  . Pneumococcal Vaccine Rash  . Pneumovax [Pneumococcal Polysaccharide Vaccine]      ROS  Constitutional: Negative for fever or weight  change.  Respiratory: Negative for cough and shortness of breath.   Cardiovascular: Negative for chest pain or palpitations.  Gastrointestinal: Negative for abdominal pain, no bowel changes.  Musculoskeletal: Negative for gait problem or joint swelling.  Skin: positive for rash on the soles and toe webs.  Neurological: Negative for dizziness or headache.  No other specific complaints in a complete review of systems (except as listed in HPI above).  Objective  Vitals:   09/15/16 1205  BP: 118/78  Pulse: 78  Resp: 18  Temp: 97.9 F (36.6 C)  TempSrc: Oral  SpO2: 92%  Weight: 268 lb (121.6 kg)  Height: 5\' 7"  (1.702 m)    Body mass index is 41.97 kg/m.  Physical Exam  Constitutional: Patient appears well-developed and well-nourished. Obese  No distress.  HEENT: head atraumatic, normocephalic, pupils equal and reactive to light,  neck supple, throat within normal limits Cardiovascular: Normal rate, regular rhythm and normal heart sounds.  No murmur heard. No BLE edema. Pulmonary/Chest: Effort normal and breath sounds normal. No respiratory distress. Abdominal: Soft.  There is no tenderness. Psychiatric: Patient has  a normal mood and affect. behavior is normal. Judgment and thought content normal.  Recent Results (from the past 2160 hour(s))  POCT HgB A1C     Status: Normal   Collection Time: 09/15/16 12:17 PM  Result Value Ref Range   Hemoglobin A1C 6.0      PHQ2/9: Depression screen [facility] 2/9 05/25/2016 02/28/2016 01/27/2016 08/27/2015 07/26/2015  Decreased Interest 0 0 0 0 0  Down, Depressed, Hopeless 0 0 0 0 0  PHQ - 2 Score 0 0 0 0 0    Fall Risk: Fall Risk  05/25/2016 02/28/2016 01/27/2016 08/27/2015 07/26/2015  Falls in the past year? No No No No No    Assessment & Plan  1. Diabetic sensorimotor neuropathy (HCC)  - POCT HgB A1C - gabapentin (NEURONTIN) 300 MG capsule; Take 1 capsule (300 mg total) by mouth 3 (three) times daily. Gradually go up , to a goal of 300 mg three times daily  Dispense: 270 capsule; Refill: 1 - Dulaglutide (TRULICITY) 1.5 0000000 SOPN; Inject 1.5 mg into the skin once a week.  Dispense: 4 pen; Refill: 2  2. Mucopurulent chronic bronchitis ([place])  Needs to stop smoking  3. Adult hypothyroidism  - TSH  4. Essential (primary) hypertension  - metoprolol (TOPROL-XL) 200 MG 24 hr tablet; Take 1 tablet (200 mg total) by mouth daily.  Dispense: 90 tablet; Refill: 1 - amLODipine-benazepril (LOTREL) 5-40 MG capsule; Take 1 capsule by mouth daily.  Dispense: 90 capsule; Refill: 1  5. Extreme obesity ([place])  Discussed life style modification   6. Primary hyperparathyroidism ([place])  Seeing Dr. Graceann Congress - and is getting x-ray done   7. Background retinopathy due to secondary diabetes ([place])  - Dulaglutide (TRULICITY) 1.5 0000000 SOPN; Inject 1.5 mg into the skin once a week.  Dispense: 4 pen; Refill: 2  8. Tinea pedis of both feet  - terbinafine (LAMISIL) 250 MG tablet; Take 1 tablet (250 mg total) by mouth daily.  Dispense: 14 tablet; Refill: 0  9. Bilateral edema of lower extremity  - potassium chloride SA (K-DUR,KLOR-CON) 20 MEQ tablet; Take 1 tablet (20  mEq total) by mouth daily.  Dispense: 90 tablet; Refill: 1 - furosemide (LASIX) 20 MG tablet; Take 1 tablet (20 mg total) by mouth daily.  Dispense: 90 tablet; Refill: 1  10. Hypertensive retinopathy, unspecified laterality  We will get copy of records from ophthalmologist  11. Dyslipidemia associated with type 2 diabetes mellitus (HCC)  - atorvastatin (LIPITOR) 40 MG tablet; Take 1 tablet (40 mg total) by mouth daily. In place of Pravastatin for cholesterol  Dispense: 90 tablet; Refill: 1 - Dulaglutide (TRULICITY) 1.5 0000000 SOPN; Inject 1.5 mg into the skin once a week.  Dispense: 4 pen; Refill: 2

## 2016-09-16 ENCOUNTER — Telehealth: Payer: Self-pay

## 2016-09-16 ENCOUNTER — Other Ambulatory Visit: Payer: Self-pay | Admitting: Family Medicine

## 2016-09-16 DIAGNOSIS — E039 Hypothyroidism, unspecified: Secondary | ICD-10-CM

## 2016-09-16 LAB — TSH: TSH: 17.97 mIU/L — ABNORMAL HIGH

## 2016-09-16 MED ORDER — LEVOTHYROXINE SODIUM 137 MCG PO TABS: 137.0000 ug | ORAL_TABLET | Freq: Every day | ORAL | 1 refills | Status: DC

## 2016-09-16 NOTE — Telephone Encounter (Signed)
Patient came by and asked about her labs. Printed a copy of her Thyroid results off and asked if she was taking her medication daily. Patient states she is taking her Levothyroxine 125 mcg every morning with no food. Please advise what dosage you would like her to start due to elevated level. Thanks. Patient ok to leave message on voicemail.

## 2016-09-16 NOTE — Progress Notes (Unsigned)
Changed to 137 mcg again, less than 10 % more per week of current dose, she will need to repeat TSH in 6 to 8 weeks . Please print an order and remind patient

## 2016-09-17 NOTE — Progress Notes (Signed)
Mailed patient lab slip and notified of her of change by voicemail.

## 2016-09-18 ENCOUNTER — Encounter: Payer: Self-pay | Admitting: Family Medicine

## 2016-09-23 ENCOUNTER — Other Ambulatory Visit: Payer: Self-pay | Admitting: Internal Medicine

## 2016-09-23 DIAGNOSIS — E210 Primary hyperparathyroidism: Secondary | ICD-10-CM

## 2016-09-30 ENCOUNTER — Ambulatory Visit
Admission: RE | Admit: 2016-09-30 | Discharge: 2016-09-30 | Disposition: A | Discharge status: Home / Self Care | Payer: BLUE CROSS/BLUE SHIELD | Source: Ambulatory Visit | Attending: Internal Medicine | Admitting: Internal Medicine

## 2016-09-30 DIAGNOSIS — E210 Primary hyperparathyroidism: Secondary | ICD-10-CM

## 2016-10-08 ENCOUNTER — Inpatient Hospital Stay: Admission: RE | Admit: 2016-10-08 | Payer: BLUE CROSS/BLUE SHIELD | Source: Ambulatory Visit

## 2016-10-13 ENCOUNTER — Other Ambulatory Visit: Payer: Self-pay | Admitting: Internal Medicine

## 2016-10-13 DIAGNOSIS — N200 Calculus of kidney: Secondary | ICD-10-CM

## 2016-11-02 ENCOUNTER — Encounter: Payer: Self-pay | Admitting: Family Medicine

## 2016-11-02 ENCOUNTER — Ambulatory Visit (INDEPENDENT_AMBULATORY_CARE_PROVIDER_SITE_OTHER): Payer: BLUE CROSS/BLUE SHIELD | Admitting: Family Medicine

## 2016-11-02 VITALS — BP 130/78 | HR 60 | Temp 98.1°F | Resp 16 | Ht 66.75 in | Wt 271.4 lb

## 2016-11-02 DIAGNOSIS — Z124 Encounter for screening for malignant neoplasm of cervix: Secondary | ICD-10-CM | POA: Diagnosis not present

## 2016-11-02 DIAGNOSIS — Z01419 Encounter for gynecological examination (general) (routine) without abnormal findings: Secondary | ICD-10-CM

## 2016-11-02 LAB — CBC WITH DIFFERENTIAL/PLATELET
Basophils Absolute: 0 cells/uL (ref 0–200)
Basophils Relative: 0 %
EOS PCT: 4 %
Eosinophils Absolute: 336 cells/uL (ref 15–500)
HEMATOCRIT: 42.8 % (ref 35.0–45.0)
HEMOGLOBIN: 13.9 g/dL (ref 11.7–15.5)
LYMPHS ABS: 2352 {cells}/uL (ref 850–3900)
Lymphocytes Relative: 28 %
MCH: 27 pg (ref 27.0–33.0)
MCHC: 32.5 g/dL (ref 32.0–36.0)
MCV: 83.3 fL (ref 80.0–100.0)
MONO ABS: 672 {cells}/uL (ref 200–950)
MPV: 10.3 fL (ref 7.5–12.5)
Monocytes Relative: 8 %
NEUTROS ABS: 5040 {cells}/uL (ref 1500–7800)
Neutrophils Relative %: 60 %
Platelets: 246 10*3/uL (ref 140–400)
RBC: 5.14 MIL/uL — AB (ref 3.80–5.10)
RDW: 14.2 % (ref 11.0–15.0)
WBC: 8.4 10*3/uL (ref 3.8–10.8)

## 2016-11-02 LAB — LIPID PANEL
CHOL/HDL RATIO: 3.2 ratio (ref <5.0)
Cholesterol: 120 mg/dL (ref <200)
HDL: 37 mg/dL — AB (ref >50)
LDL Cholesterol: 57 mg/dL (ref <100)
Triglycerides: 128 mg/dL (ref <150)
VLDL: 26 mg/dL (ref <30)

## 2016-11-02 LAB — COMPLETE METABOLIC PANEL WITH GFR
ALK PHOS: 96 U/L (ref 33–130)
ALT: 14 U/L (ref 6–29)
AST: 12 U/L (ref 10–35)
Albumin: 3.8 g/dL (ref 3.6–5.1)
BUN: 9 mg/dL (ref 7–25)
CALCIUM: 10.6 mg/dL — AB (ref 8.6–10.4)
CHLORIDE: 106 mmol/L (ref 98–110)
CO2: 28 mmol/L (ref 20–31)
Creat: 0.74 mg/dL (ref 0.50–0.99)
GFR, EST NON AFRICAN AMERICAN: 88 mL/min (ref >=60)
GFR, Est African American: >89 mL/min (ref >=60)
GLUCOSE: 74 mg/dL (ref 65–99)
POTASSIUM: 4.7 mmol/L (ref 3.5–5.3)
SODIUM: 140 mmol/L (ref 135–146)
Total Bilirubin: 0.4 mg/dL (ref 0.2–1.2)
Total Protein: 6.8 g/dL (ref 6.1–8.1)

## 2016-11-02 LAB — TSH: TSH: 1.49 m[IU]/L

## 2016-11-02 NOTE — Progress Notes (Signed)
Name: Charlene Hardy   MRN: [id number]    DOB: 1955-09-21   Date:11/02/2016       Progress Note  Subjective  Chief Complaint  Chief Complaint  Patient presents with  . Annual Exam    HPI  Well Woman: she had a hysterectomy, no bladder problems, no change in bowel movements, colonoscopy is up to date, mammogram is up to date. Not sexually active for years. No vaginal discharge or breast lumps.    Patient Active Problem List   Diagnosis Date Noted  . Primary hyperparathyroidism ([place]) 02/28/2016  . Arthralgia of both knees 10/23/2015  . Chronic bronchitis ([place]) 10/23/2015  . Osteopenia 10/23/2015  . Left ankle pain 04/25/2015  . Constipation 04/25/2015  . Allergic rhinitis 01/20/2015  . 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/2015  . Diabetic sensorimotor neuropathy ([place]) 01/20/2015  . Dyslipidemia 01/20/2015  . Essential (primary) hypertension 01/20/2015  . H/O iron deficiency anemia 01/20/2015  . Hypertensive retinopathy 01/20/2015  . Adult hypothyroidism 01/20/2015  . Extreme obesity ([place]) 01/20/2015  . Background retinopathy due to secondary diabetes ([place]) 01/20/2015  . Tinea pedis 01/20/2015  . Vitamin D deficiency 02/26/2010  . Cigarette smoker 03/06/2008    Past Surgical History:  Procedure Laterality Date  . ABDOMINAL HYSTERECTOMY  08/11/1999   still has ovaries  . COLONOSCOPY  08/11/2015  . COLONOSCOPY WITH PROPOFOL N/A 06/10/2016   Procedure: COLONOSCOPY WITH PROPOFOL;  Surgeon: Christene Lye, MD;  Location: [facility] ENDOSCOPY;  Service: Endoscopy;  Laterality: N/A;    Family History  Problem Relation Age of Onset  . Multiple sclerosis Mother   . Diabetes Father   . Breast cancer Sister 59  . Hypertension Maternal Grandmother   . Thyroid disease Paternal Uncle   . Breast cancer Paternal Aunt     Social History   Social History  . Marital status: Single    Spouse name: N/A  . Number of children: N/A  . Years of education: N/A   Occupational History  .  Not on file.   Social History Main Topics  . Smoking status: Current Every Day Smoker    Packs/day: 1.00    Years: 40.00    Types: Cigarettes    Start date: 08/10/1976  . Smokeless tobacco: Never Used  . Alcohol use No  . Drug use: No  . Sexual activity: Not Currently   Other Topics Concern  . Not on file   Social History Narrative  . No narrative on file     Current Outpatient Prescriptions:  .  acetaminophen (TYLENOL) 500 MG tablet, Take 1 tablet (500 mg total) by mouth 2 (two) times daily., Disp: 30 tablet, Rfl: 0 .  amLODipine-benazepril (LOTREL) 5-40 MG capsule, Take 1 capsule by mouth daily., Disp: 90 capsule, Rfl: 1 .  aspirin 81 MG tablet, Take 1 tablet by mouth daily., Disp: , Rfl:  .  atorvastatin (LIPITOR) 40 MG tablet, Take 1 tablet (40 mg total) by mouth daily. In place of Pravastatin for cholesterol, Disp: 90 tablet, Rfl: 1 .  Cholecalciferol (VITAMIN D) 2000 UNITS tablet, Take 1 tablet by mouth daily., Disp: , Rfl:  .  Dulaglutide (TRULICITY) 1.5 IE/3.3IR SOPN, Inject 1.5 mg into the skin once a week., Disp: 4 pen, Rfl: 2 .  ferrous sulfate 325 (65 FE) MG tablet, Take 1 tablet by mouth daily., Disp: , Rfl:  .  furosemide (LASIX) 20 MG tablet, Take 1 tablet (20 mg total) by mouth daily., Disp: 90 tablet, Rfl: 1 .  gabapentin (NEURONTIN) 300  MG capsule, Take 1 capsule (300 mg total) by mouth 3 (three) times daily. Gradually go up , to a goal of 300 mg three times daily, Disp: 270 capsule, Rfl: 1 .  Ipratropium-Albuterol (COMBIVENT RESPIMAT) 20-100 MCG/ACT AERS respimat, Inhale 1 puff into the lungs every 6 (six) hours as needed for wheezing., Disp: 1 Inhaler, Rfl: 2 .  levothyroxine (SYNTHROID, LEVOTHROID) 137 MCG tablet, Take 1 tablet (137 mcg total) by mouth daily before breakfast., Disp: 30 tablet, Rfl: 1 .  metFORMIN (GLUCOPHAGE) 500 MG tablet, , Disp: , Rfl: 1 .  metoprolol (TOPROL-XL) 200 MG 24 hr tablet, Take 1 tablet (200 mg total) by mouth daily., Disp: 90  tablet, Rfl: 1 .  MULTIPLE VITAMINS-MINERALS ER PO, Take 1 tablet by mouth daily., Disp: , Rfl:  .  naftifine (NAFTIN) 1 % cream, Apply topically., Disp: , Rfl:  .  omega-3 fish oil (MAXEPA) 1000 MG CAPS capsule, Take 1 capsule by mouth daily., Disp: , Rfl:  .  Polyethylene Glycol 3350 POWD, Take 1 Dose by mouth as needed., Disp: , Rfl:  .  potassium chloride SA (K-DUR,KLOR-CON) 20 MEQ tablet, Take 1 tablet (20 mEq total) by mouth daily., Disp: 90 tablet, Rfl: 1  Allergies  Allergen Reactions  . Hydrochlorothiazide     hyperparathyroidism  . Pneumococcal Vaccine Rash  . Pneumovax [Pneumococcal Polysaccharide Vaccine]      ROS  Constitutional: Negative for fever , positive for weight change.  Respiratory: Negative for cough and shortness of breath.   Cardiovascular: Negative for chest pain or palpitations.  Gastrointestinal: Negative for abdominal pain, no bowel changes.  Musculoskeletal: Negative for gait problem or joint swelling.  Skin: Positive  for rash. Dry skin, hair is brittle ( TSH was very high on her last check ) Neurological: Negative for dizziness or headache.  No other specific complaints in a complete review of systems (except as listed in HPI above).  Objective  Vitals:   11/02/16 0923  BP: 130/78  Pulse: 60  Resp: 16  Temp: 98.1 F (36.7 C)  TempSrc: Oral  SpO2: 96%  Weight: 271 lb 6.4 oz (123.1 kg)  Height: 5' 6.75" (1.695 m)    Body mass index is 42.83 kg/m.  Physical Exam  Constitutional: Patient appears well-developed and well-nourished. No distress.  HENT: Head: Normocephalic and atraumatic. Ears: B TMs ok, no erythema or effusion; Nose: Nose normal. Mouth/Throat: Oropharynx is clear and moist. No oropharyngeal exudate.  Eyes: Conjunctivae and EOM are normal. Pupils are equal, round, and reactive to light. No scleral icterus.  Neck: Normal range of motion. Neck supple. No JVD present. No thyromegaly present.  Cardiovascular: Normal rate, regular  rhythm and normal heart sounds.  No murmur heard. No BLE edema. Pulmonary/Chest: Effort normal and breath sounds normal. No respiratory distress. Abdominal: Soft. Bowel sounds are normal, no distension. There is no tenderness. no masses Breast: no lumps or masses, no nipple discharge or rashes FEMALE GENITALIA:  External genitalia normal External urethra normal Vaginal vault normal without discharge or lesions Absent cervix RECTAL: no rectal masses or hemorrhoids Musculoskeletal: Normal range of motion, no joint effusions. No gross deformities Neurological: he is alert and oriented to person, place, and time. No cranial nerve deficit. Coordination, balance, strength, speech and gait are normal.  Skin: Skin is warm and dry. Rash on both feet, patient states can't afford to go back to podiatrist . No erythema.  Psychiatric: Patient has a normal mood and affect. behavior is normal. Judgment and thought content  normal.  Recent Results (from the past 2160 hour(s))  POCT HgB A1C     Status: Normal   Collection Time: 09/15/16 12:17 PM  Result Value Ref Range   Hemoglobin A1C 6.0   TSH     Status: Abnormal   Collection Time: 09/15/16 12:43 PM  Result Value Ref Range   TSH 17.97 (H) mIU/L    Comment:   Reference Range   > or = 20 Years  0.40-4.50   Pregnancy Range First trimester  0.26-2.66 Second trimester 0.55-2.73 Third trimester  0.43-2.91        PHQ2/9: Depression screen [facility] 2/9 11/02/2016 05/25/2016 02/28/2016 01/27/2016 08/27/2015  Decreased Interest 0 0 0 0 0  Down, Depressed, Hopeless 0 0 0 0 0  PHQ - 2 Score 0 0 0 0 0    Fall Risk: Fall Risk  11/02/2016 05/25/2016 02/28/2016 01/27/2016 08/27/2015  Falls in the past year? No No No No No     Functional Status Survey: Is the patient deaf or have difficulty hearing?: No Does the patient have difficulty seeing, even when wearing glasses/contacts?: No Does the patient have difficulty concentrating, remembering, or making  decisions?: No Does the patient have difficulty walking or climbing stairs?: No Does the patient have difficulty dressing or bathing?: No Does the patient have difficulty doing errands alone such as visiting a doctor's office or shopping?: No    Assessment & Plan  1. Well woman exam  Discussed importance of 150 minutes of physical activity weekly, eat two servings of fish weekly, eat one serving of tree nuts ( cashews, pistachios, pecans, almonds.Marland Kitchen) every other day, eat 6 servings of fruit/vegetables daily and drink plenty of water and avoid sweet beverages.  - Lipid panel - COMPLETE METABOLIC PANEL WITH GFR - CBC with Differential/Platelet - TSH  2. Cervical cancer screening   Not necessary, patient does not have a cervix

## 2016-11-03 ENCOUNTER — Telehealth: Payer: Self-pay

## 2016-11-03 NOTE — Telephone Encounter (Signed)
Left message to call back regarding her labs.

## 2016-11-03 NOTE — Telephone Encounter (Signed)
-----   Message from Steele Sizer, MD sent at 11/03/2016  8:01 AM EDT ----- Lipid panel shows low HDL : to improve HDL patient  needs to eat tree nuts ( pecans/pistachios/almonds ) four times weekly, eat fish two times weekly  and exercise  at least 150 minutes per week Sugar, kidney and transaminases are within normal limits Calcium stable, continue follow up with Endocrinologist TSH is back at goal , continue current dose of levothyroxine Normal CBC

## 2016-11-18 ENCOUNTER — Other Ambulatory Visit: Payer: Self-pay | Admitting: Family Medicine

## 2016-11-18 NOTE — Telephone Encounter (Signed)
Patient requesting refill of Levothyroxine to [facility]. 

## 2016-12-26 ENCOUNTER — Other Ambulatory Visit: Payer: Self-pay | Admitting: Family Medicine

## 2016-12-26 DIAGNOSIS — E1140 Type 2 diabetes mellitus with diabetic neuropathy, unspecified: Secondary | ICD-10-CM

## 2016-12-28 ENCOUNTER — Ambulatory Visit: Payer: BLUE CROSS/BLUE SHIELD | Admitting: Family Medicine

## 2017-01-28 ENCOUNTER — Ambulatory Visit (INDEPENDENT_AMBULATORY_CARE_PROVIDER_SITE_OTHER): Payer: BLUE CROSS/BLUE SHIELD | Admitting: Family Medicine

## 2017-01-28 ENCOUNTER — Encounter: Payer: Self-pay | Admitting: Family Medicine

## 2017-01-28 VITALS — BP 130/72 | HR 73 | Temp 97.6°F | Resp 16 | Ht 67.0 in | Wt 268.0 lb

## 2017-01-28 DIAGNOSIS — J411 Mucopurulent chronic bronchitis: Secondary | ICD-10-CM

## 2017-01-28 DIAGNOSIS — I10 Essential (primary) hypertension: Secondary | ICD-10-CM | POA: Diagnosis not present

## 2017-01-28 DIAGNOSIS — B353 Tinea pedis: Secondary | ICD-10-CM | POA: Diagnosis not present

## 2017-01-28 DIAGNOSIS — E13319 Other specified diabetes mellitus with unspecified diabetic retinopathy without macular edema: Secondary | ICD-10-CM

## 2017-01-28 DIAGNOSIS — E039 Hypothyroidism, unspecified: Secondary | ICD-10-CM | POA: Diagnosis not present

## 2017-01-28 DIAGNOSIS — E785 Hyperlipidemia, unspecified: Secondary | ICD-10-CM

## 2017-01-28 DIAGNOSIS — E1142 Type 2 diabetes mellitus with diabetic polyneuropathy: Secondary | ICD-10-CM

## 2017-01-28 DIAGNOSIS — E1169 Type 2 diabetes mellitus with other specified complication: Secondary | ICD-10-CM | POA: Diagnosis not present

## 2017-01-28 DIAGNOSIS — R600 Localized edema: Secondary | ICD-10-CM

## 2017-01-28 DIAGNOSIS — E133299 Other specified diabetes mellitus with mild nonproliferative diabetic retinopathy without macular edema, unspecified eye: Secondary | ICD-10-CM

## 2017-01-28 DIAGNOSIS — E210 Primary hyperparathyroidism: Secondary | ICD-10-CM

## 2017-01-28 DIAGNOSIS — E668 Other obesity: Secondary | ICD-10-CM

## 2017-01-28 LAB — POCT GLYCOSYLATED HEMOGLOBIN (HGB A1C): HEMOGLOBIN A1C: 5.5

## 2017-01-28 LAB — POCT UA - MICROALBUMIN: Microalbumin Ur, POC: NEGATIVE mg/L

## 2017-01-28 MED ORDER — AMLODIPINE BESY-BENAZEPRIL HCL 5-40 MG PO CAPS: 1.0000 | ORAL_CAPSULE | Freq: Every day | ORAL | 1 refills | Status: DC

## 2017-01-28 MED ORDER — LEVOTHYROXINE SODIUM 137 MCG PO TABS: ORAL_TABLET | ORAL | 0 refills | Status: DC

## 2017-01-28 MED ORDER — ATORVASTATIN CALCIUM 40 MG PO TABS: 40.0000 mg | ORAL_TABLET | Freq: Every day | ORAL | 1 refills | Status: DC

## 2017-01-28 MED ORDER — GABAPENTIN 300 MG PO CAPS: 300.0000 mg | ORAL_CAPSULE | Freq: Three times a day (TID) | ORAL | 1 refills | Status: DC

## 2017-01-28 MED ORDER — NAFTIFINE HCL 1 % EX CREA: TOPICAL_CREAM | Freq: Every day | CUTANEOUS | 1 refills | Status: DC

## 2017-01-28 MED ORDER — METOPROLOL SUCCINATE ER 200 MG PO TB24: 200.0000 mg | ORAL_TABLET | Freq: Every day | ORAL | 1 refills | Status: DC

## 2017-01-28 MED ORDER — DULAGLUTIDE 1.5 MG/0.5ML ~~LOC~~ SOAJ: 1.5000 mg | SUBCUTANEOUS | 2 refills | Status: DC

## 2017-01-28 NOTE — Progress Notes (Signed)
Name: Charlene Hardy   MRN: [id number]    DOB: November 21, 1955   Date:01/28/2017       Progress Note  Subjective  Chief Complaint  Chief Complaint  Patient presents with  . Medication Refill    3 month F/U  . Diabetes    Does not check her sugar at home  . Hypertension    Edema in bilateral legs  . Hyperlipidemia  . Hypothyroidism    Dry Skin    HPI  DMII: taking Metformin and Trulicity, last eye exam no diabetic retinopathy in 2017. She has peripheral neuropathy - only lack of sensation - severe bilateral foot pain that is currently controlled with Gabapentin.  She is compliant with medication, denies polyphagia, polyuria but she has polydipsia ( we will give her a note to have water near her at work - likely hyperparathyroidism  ).  HgbA1C is at goal and no protein in urine   Obesity: with multiple risk factors, also bilateral knee pain, she had lost some weight since last visit, down 2 lbs discussed life style modification, she stopped cooking because it is too hot, she has been eating more sandwiches lately.   Bilateral knee arthralgias: switch from left to right intermittent knee pain. She has been on Meloxicam because of kidney function and is taking Tylenol and Gabapentin, with good control of symptoms   Chronic bronchitis: She is still smoking and not ready to quit. She denies SOB but has a daily dry cough, but stable, she states Combivent seems to help with symptoms.   HTN: she is taking medications as prescribed, bp is at goal today. No chest pain or palpitation. She also has lower extremity edema, she can't take HCTZ because it causes hypercalcemia but is doing well on lasix . She is also on Metoprolol.   Dyslipidemia: low HDL, on Atorvastatin and otc fish oil . No myalgias  Hypothyroidism: last TSH was back to normal , she is back on 137 mcg daily   Hyperparathyroidism: seeing Endocrinologist, will have a KUB, explained importance of drinking water to decrease risk of  kidney stones  Tinea Pedis: she has pilling skin and dark skin on both feet and between toes, going on for many months, she stopped using Naftin, we will send a refill   Patient Active Problem List   Diagnosis Date Noted  . Primary hyperparathyroidism ([place]) 02/28/2016  . Arthralgia of both knees 10/23/2015  . Chronic bronchitis ([place]) 10/23/2015  . Osteopenia 10/23/2015  . Left ankle pain 04/25/2015  . Constipation 04/25/2015  . Allergic rhinitis 01/20/2015  . 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/2015  . Diabetic sensorimotor neuropathy ([place]) 01/20/2015  . Dyslipidemia 01/20/2015  . Essential (primary) hypertension 01/20/2015  . H/O iron deficiency anemia 01/20/2015  . Hypertensive retinopathy 01/20/2015  . Adult hypothyroidism 01/20/2015  . Extreme obesity 01/20/2015  . Background retinopathy due to secondary diabetes ([place]) 01/20/2015  . Tinea pedis 01/20/2015  . Vitamin D deficiency 02/26/2010  . Cigarette smoker 03/06/2008    Past Surgical History:  Procedure Laterality Date  . ABDOMINAL HYSTERECTOMY  08/11/1999   still has ovaries  . COLONOSCOPY  08/11/2015  . COLONOSCOPY WITH PROPOFOL N/A 06/10/2016   Procedure: COLONOSCOPY WITH PROPOFOL;  Surgeon: Christene Lye, MD;  Location: [facility] ENDOSCOPY;  Service: Endoscopy;  Laterality: N/A;    Family History  Problem Relation Age of Onset  . Multiple sclerosis Mother   . Diabetes Father   . Breast cancer Sister 40  . Hypertension Maternal Grandmother   .  Thyroid disease Paternal Uncle   . Breast cancer Paternal Aunt     Social History   Social History  . Marital status: Single    Spouse name: N/A  . Number of children: N/A  . Years of education: N/A   Occupational History  . Not on file.   Social History Main Topics  . Smoking status: Current Every Day Smoker    Packs/day: 1.00    Years: 40.00    Types: Cigarettes    Start date: 08/10/1976  . Smokeless tobacco: Never Used  . Alcohol use No  . Drug use: No  .  Sexual activity: Not Currently   Other Topics Concern  . Not on file   Social History Narrative  . No narrative on file     Current Outpatient Prescriptions:  .  acetaminophen (TYLENOL) 500 MG tablet, Take 1 tablet (500 mg total) by mouth 2 (two) times daily., Disp: 30 tablet, Rfl: 0 .  amLODipine-benazepril (LOTREL) 5-40 MG capsule, Take 1 capsule by mouth daily., Disp: 90 capsule, Rfl: 1 .  aspirin 81 MG tablet, Take 1 tablet by mouth daily., Disp: , Rfl:  .  atorvastatin (LIPITOR) 40 MG tablet, Take 1 tablet (40 mg total) by mouth daily. In place of Pravastatin for cholesterol, Disp: 90 tablet, Rfl: 1 .  Cholecalciferol (VITAMIN D) 2000 UNITS tablet, Take 1 tablet by mouth daily., Disp: , Rfl:  .  Dulaglutide (TRULICITY) 1.5 MA/0.0KH SOPN, Inject 1.5 mg into the skin once a week., Disp: 4 pen, Rfl: 2 .  ferrous sulfate 325 (65 FE) MG tablet, Take 1 tablet by mouth daily., Disp: , Rfl:  .  furosemide (LASIX) 20 MG tablet, Take 1 tablet (20 mg total) by mouth daily., Disp: 90 tablet, Rfl: 1 .  gabapentin (NEURONTIN) 300 MG capsule, Take 1 capsule (300 mg total) by mouth 3 (three) times daily. Gradually go up , to a goal of 300 mg three times daily, Disp: 270 capsule, Rfl: 1 .  Ipratropium-Albuterol (COMBIVENT RESPIMAT) 20-100 MCG/ACT AERS respimat, Inhale 1 puff into the lungs every 6 (six) hours as needed for wheezing., Disp: 1 Inhaler, Rfl: 2 .  levothyroxine (SYNTHROID, LEVOTHROID) 137 MCG tablet, TAKE ONE TABLET BY MOUTH ONCE DAILY BEFORE BREAKFAST, Disp: 30 tablet, Rfl: 1 .  metFORMIN (GLUCOPHAGE) 500 MG tablet, TAKE ONE TABLET BY MOUTH ONCE DAILY, Disp: 90 tablet, Rfl: 1 .  metoprolol (TOPROL-XL) 200 MG 24 hr tablet, Take 1 tablet (200 mg total) by mouth daily., Disp: 90 tablet, Rfl: 1 .  MULTIPLE VITAMINS-MINERALS ER PO, Take 1 tablet by mouth daily., Disp: , Rfl:  .  omega-3 fish oil (MAXEPA) 1000 MG CAPS capsule, Take 1 capsule by mouth daily., Disp: , Rfl:  .  Polyethylene Glycol  3350 POWD, Take 1 Dose by mouth as needed., Disp: , Rfl:  .  potassium chloride SA (K-DUR,KLOR-CON) 20 MEQ tablet, Take 1 tablet (20 mEq total) by mouth daily., Disp: 90 tablet, Rfl: 1 .  naftifine (NAFTIN) 1 % cream, Apply topically., Disp: , Rfl:   Allergies  Allergen Reactions  . Hydrochlorothiazide     hyperparathyroidism  . Pneumococcal Vaccine Rash  . Pneumovax [Pneumococcal Polysaccharide Vaccine]      ROS  Constitutional: Negative for fever or weight change.  Respiratory: positive for cough but  shortness of breath.   Cardiovascular: Negative for chest pain or palpitations.  Gastrointestinal: Negative for abdominal pain, no bowel changes.  Musculoskeletal: Negative for gait problem or joint swelling.  Skin:  positive  for rash on toe webs  Neurological: Negative for dizziness or headache.  No other specific complaints in a complete review of systems (except as listed in HPI above).  Objective  Vitals:   01/28/17 1117  BP: 130/72  Pulse: 73  Resp: 16  Temp: 97.6 F (36.4 C)  TempSrc: Oral  SpO2: 97%  Weight: 268 lb (121.6 kg)  Height: _0  (1.702 m)    Body mass index is 41.97 kg/m.  Physical Exam  Constitutional: Patient appears well-developed and well-nourished. Obese No distress.  HEENT: head atraumatic, normocephalic, pupils equal and reactive to light, neck supple, throat within normal limits Cardiovascular: Normal rate, regular rhythm and normal heart sounds.  No murmur heard. trace  BLE edema. Pulmonary/Chest: Effort normal and breath sounds normal. No respiratory distress. Abdominal: Soft.  There is no tenderness. Psychiatric: Patient has a normal mood and affect. behavior is normal. Judgment and thought content normal.  Recent Results (from the past 2160 hour(s))  Lipid panel     Status: Abnormal   Collection Time: 11/02/16 10:13 AM  Result Value Ref Range   Cholesterol 120 <200 mg/dL   Triglycerides 128 <150 mg/dL   HDL 37 (L) >50 mg/dL    Total CHOL/HDL Ratio 3.2 <5.0 Ratio   VLDL 26 <30 mg/dL   LDL Cholesterol 57 <100 mg/dL  COMPLETE METABOLIC PANEL WITH GFR     Status: Abnormal   Collection Time: 11/02/16 10:13 AM  Result Value Ref Range   Sodium 140 135 - 146 mmol/L   Potassium 4.7 3.5 - 5.3 mmol/L   Chloride 106 98 - 110 mmol/L   CO2 28 20 - 31 mmol/L   Glucose, Bld 74 65 - 99 mg/dL   BUN 9 7 - 25 mg/dL   Creat 0.74 0.50 - 0.99 mg/dL    Comment:   For patients > or = 61 years of age: The upper reference limit for Creatinine is approximately 13% higher for people identified as African-American.      Total Bilirubin 0.4 0.2 - 1.2 mg/dL   Alkaline Phosphatase 96 33 - 130 U/L   AST 12 10 - 35 U/L   ALT 14 6 - 29 U/L   Total Protein 6.8 6.1 - 8.1 g/dL   Albumin 3.8 3.6 - 5.1 g/dL   Calcium 10.6 (H) 8.6 - 10.4 mg/dL   GFR, Est African American >89 >=60 mL/min   GFR, Est Non African American 88 >=60 mL/min  CBC with Differential/Platelet     Status: Abnormal   Collection Time: 11/02/16 10:13 AM  Result Value Ref Range   WBC 8.4 3.8 - 10.8 K/uL   RBC 5.14 (H) 3.80 - 5.10 MIL/uL   Hemoglobin 13.9 11.7 - 15.5 g/dL   HCT 42.8 35.0 - 45.0 %   MCV 83.3 80.0 - 100.0 fL   MCH 27.0 27.0 - 33.0 pg   MCHC 32.5 32.0 - 36.0 g/dL   RDW 14.2 11.0 - 15.0 %   Platelets 246 140 - 400 K/uL   MPV 10.3 7.5 - 12.5 fL   Neutro Abs 5,040 1,500 - 7,800 cells/uL   Lymphs Abs 2,352 850 - 3,900 cells/uL   Monocytes Absolute 672 200 - 950 cells/uL   Eosinophils Absolute 336 15 - 500 cells/uL   Basophils Absolute 0 0 - 200 cells/uL   Neutrophils Relative % 60 %   Lymphocytes Relative 28 %   Monocytes Relative 8 %   Eosinophils Relative 4 %   Basophils  Relative 0 %   Smear Review Criteria for review not met   TSH     Status: None   Collection Time: 11/02/16 10:13 AM  Result Value Ref Range   TSH 1.49 mIU/L    Comment:   Reference Range   > or = 20 Years  0.40-4.50   Pregnancy Range First trimester  0.26-2.66 Second  trimester 0.55-2.73 Third trimester  0.43-2.91     POCT UA - Microalbumin     Status: Normal   Collection Time: 01/28/17 11:24 AM  Result Value Ref Range   Microalbumin Ur, POC negative mg/L   Creatinine, POC  mg/dL   Albumin/Creatinine Ratio, Urine, POC    POCT HgB A1C     Status: None   Collection Time: 01/28/17 11:41 AM  Result Value Ref Range   Hemoglobin A1C 5.5     Diabetic Foot Exam: Diabetic Foot Exam - Simple   Simple Foot Form Diabetic Foot exam was performed with the following findings:  Yes 01/28/2017 11:49 AM  Visual Inspection See comments:  Yes Sensation Testing See comments:  Yes Pulse Check Posterior Tibialis and Dorsalis pulse intact bilaterally:  Yes Comments Maceration between toes, corn formation, brittle nails and also lack of sensation with monofilament test bilaterally      PHQ2/9: Depression screen [facility] 2/9 01/28/2017 11/02/2016 05/25/2016 02/28/2016 01/27/2016  Decreased Interest 0 0 0 0 0  Down, Depressed, Hopeless 0 0 0 0 0  PHQ - 2 Score 0 0 0 0 0    Fall Risk: Fall Risk  01/28/2017 11/02/2016 05/25/2016 02/28/2016 01/27/2016  Falls in the past year? _0     Functional Status Survey: Is the patient deaf or have difficulty hearing?: No Does the patient have difficulty seeing, even when wearing glasses/contacts?: No Does the patient have difficulty concentrating, remembering, or making decisions?: No Does the patient have difficulty walking or climbing stairs?: No Does the patient have difficulty dressing or bathing?: No Does the patient have difficulty doing errands alone such as visiting a doctor's office or shopping?: No    Assessment & Plan  1. Background retinopathy due to secondary diabetes ([place])  - POCT HgB A1C - POCT UA - Microalbumin - Dulaglutide (TRULICITY) 1.5 GF/8.4KJ SOPN; Inject 1.5 mg into the skin once a week.  Dispense: 4 pen; Refill: 2  2. Diabetic sensorimotor neuropathy (HCC)  - gabapentin (NEURONTIN) 300  MG capsule; Take 1 capsule (300 mg total) by mouth 3 (three) times daily. Gradually go up , to a goal of 300 mg three times daily  Dispense: 270 capsule; Refill: 1 - Dulaglutide (TRULICITY) 1.5 IZ/1.2OF SOPN; Inject 1.5 mg into the skin once a week.  Dispense: 4 pen; Refill: 2  3. Mucopurulent chronic bronchitis ([place])   4. Adult hypothyroidism  - levothyroxine (SYNTHROID, LEVOTHROID) 137 MCG tablet; TAKE ONE TABLET BY MOUTH ONCE DAILY BEFORE BREAKFAST  Dispense: 90 tablet; Refill: 0  5. Essential (primary) hypertension  - metoprolol (TOPROL-XL) 200 MG 24 hr tablet; Take 1 tablet (200 mg total) by mouth daily.  Dispense: 90 tablet; Refill: 1 - amLODipine-benazepril (LOTREL) 5-40 MG capsule; Take 1 capsule by mouth daily.  Dispense: 90 capsule; Refill: 1  6. Extreme obesity   7. Primary hyperparathyroidism ([place])  Keep follow up with Endo  8. Bilateral edema of lower extremity  stable  9. Dyslipidemia associated with type 2 diabetes mellitus (HCC)  - atorvastatin (LIPITOR) 40 MG tablet; Take 1 tablet (40 mg total) by mouth daily.  In place of Pravastatin for cholesterol  Dispense: 90 tablet; Refill: 1 - Dulaglutide (TRULICITY) 1.5 ID/0.2MM SOPN; Inject 1.5 mg into the skin once a week.  Dispense: 4 pen; Refill: 2  10. Tinea pedis of both feet  - naftifine (NAFTIN) 1 % cream; Apply topically daily.  Dispense: 90 g; Refill: 1

## 2017-02-02 ENCOUNTER — Other Ambulatory Visit: Payer: Self-pay | Admitting: Family Medicine

## 2017-02-02 NOTE — Telephone Encounter (Signed)
Patient requesting refill of Levothyroxine to [facility]. 

## 2017-05-25 IMAGING — MG MM DIGITAL SCREENING BILAT W/ CAD
6 series · 6 of 6 positions shown · non-contrast
Comparison: Previous exam(s).

CLINICAL DATA: Screening.

EXAM:
DIGITAL SCREENING BILATERAL MAMMOGRAM WITH CAD

[L CV]
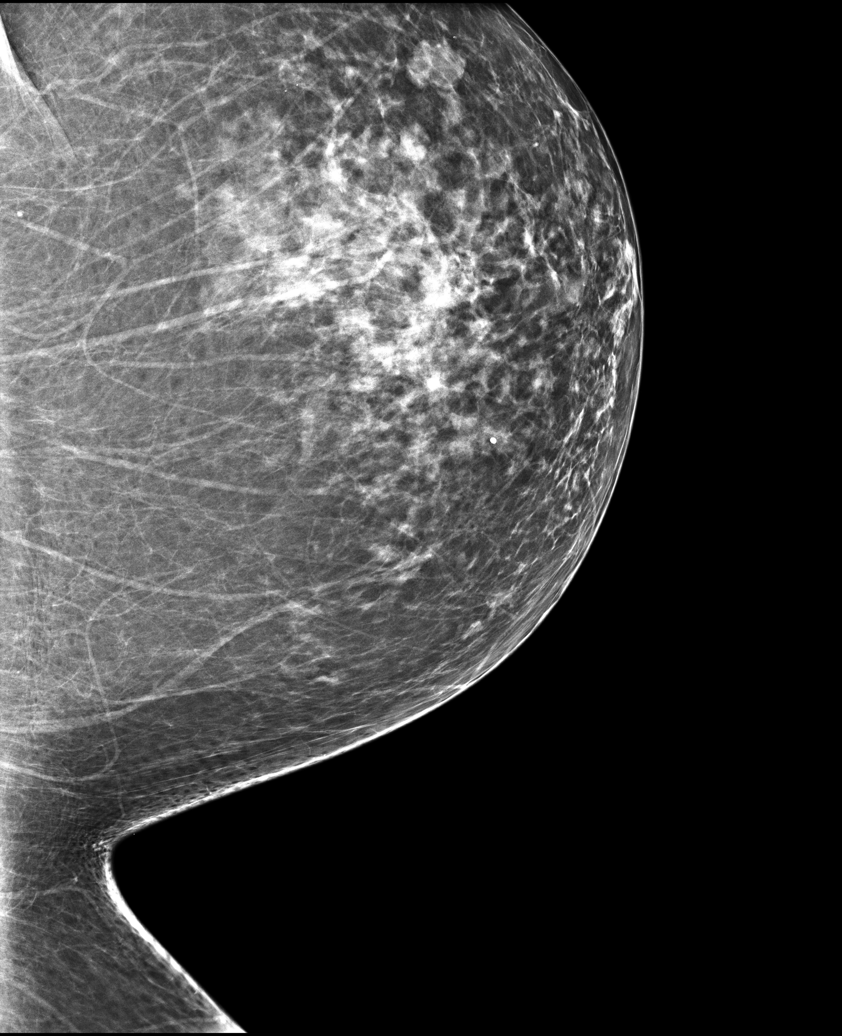

[L MLO]
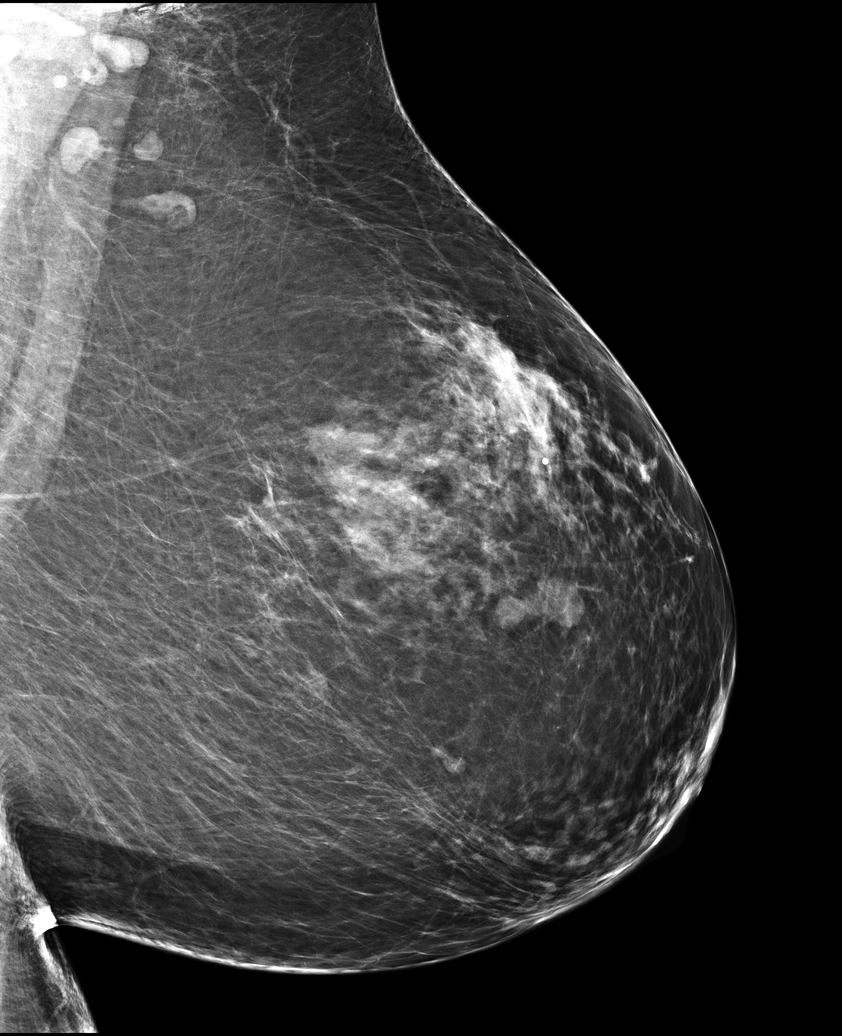

[R CC]
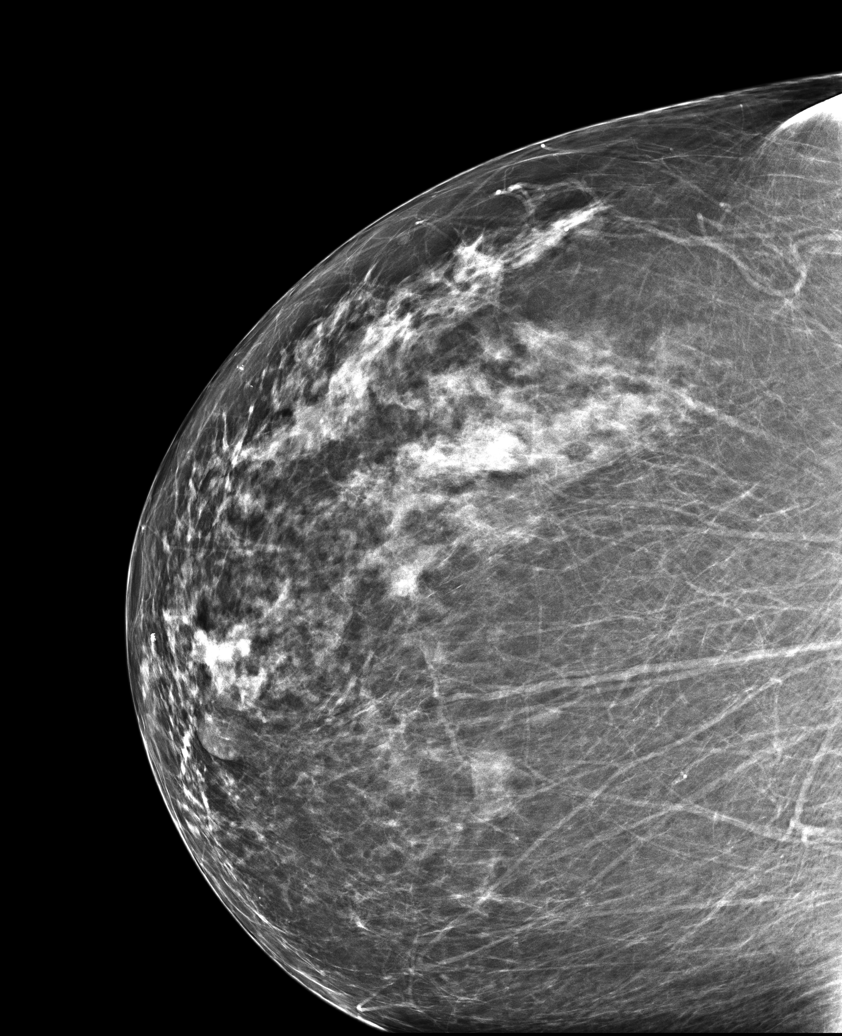

[R CV]
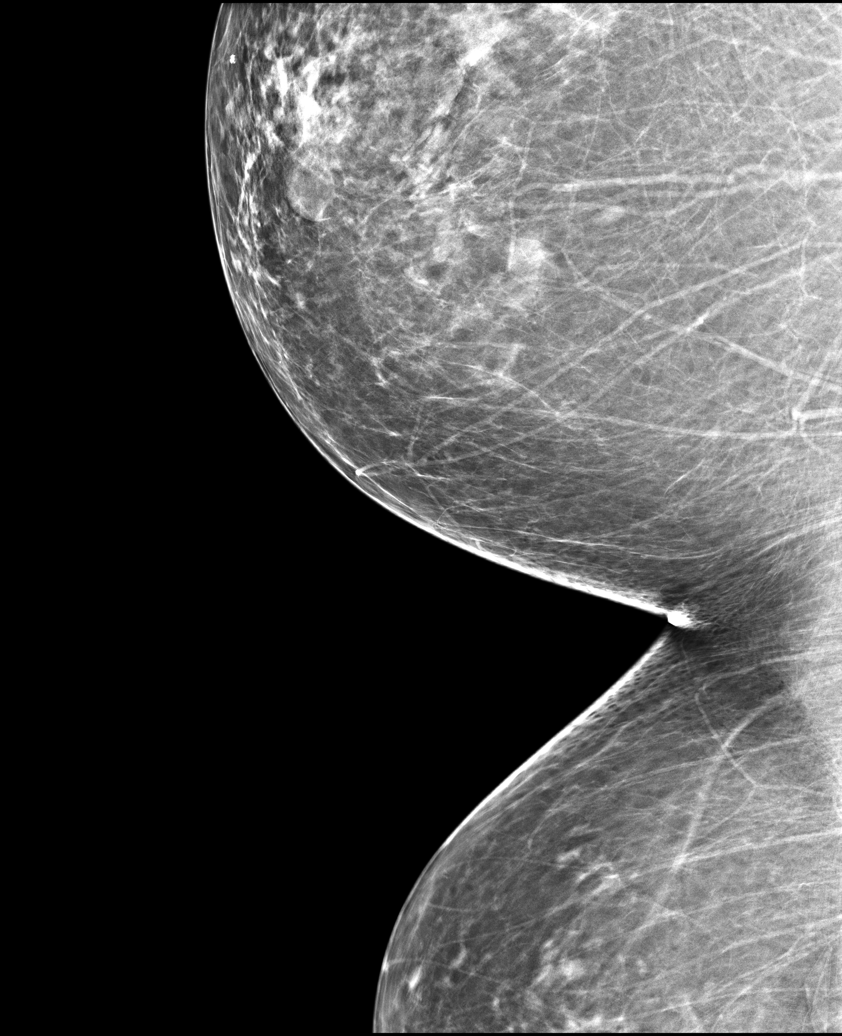

[L CC]
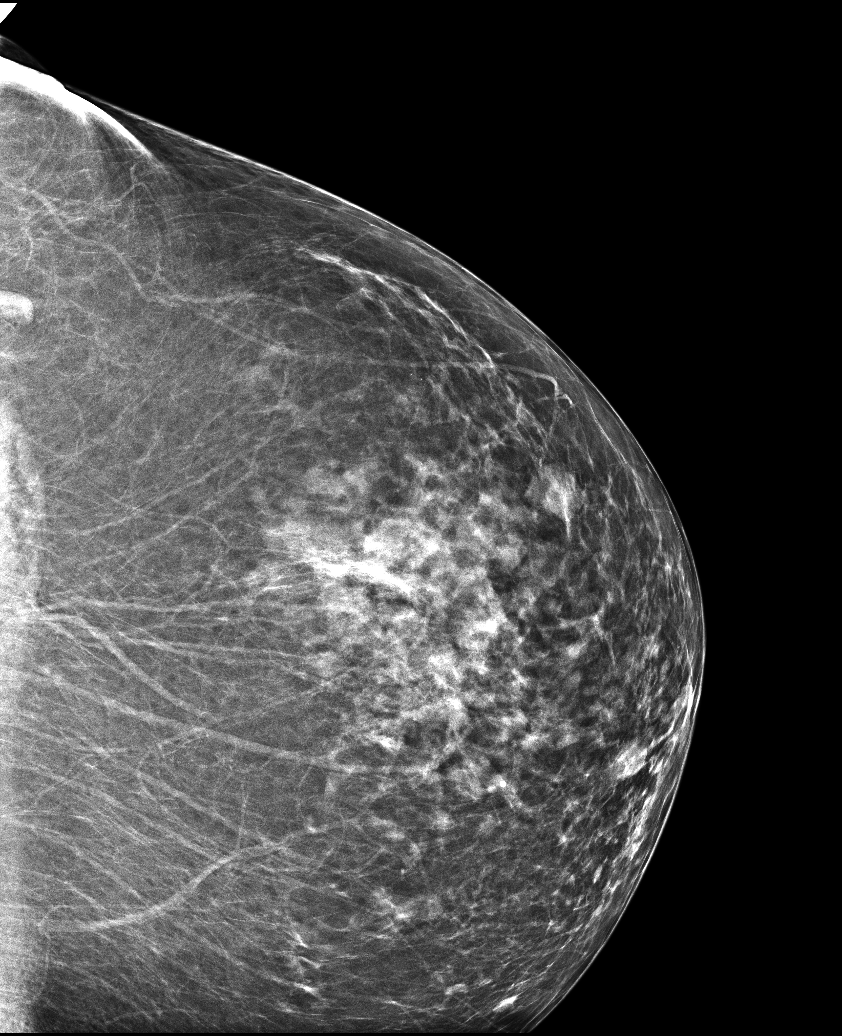

[R MLO]
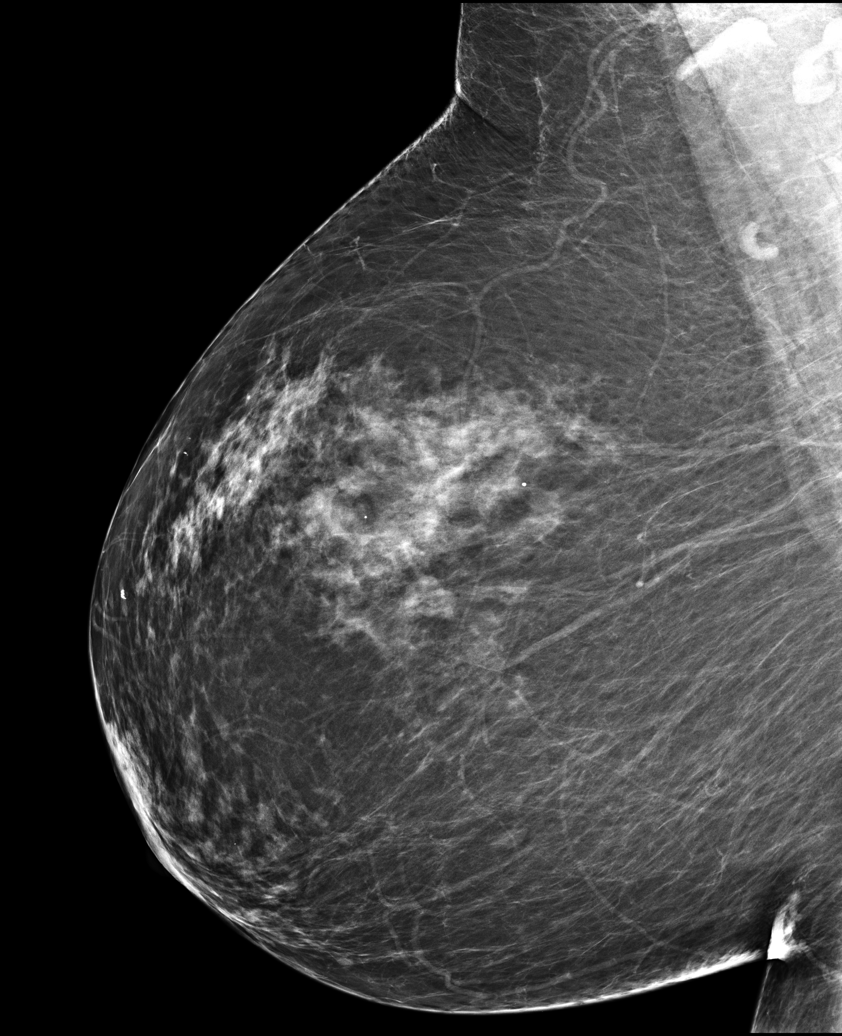

[6 of 6 positions shown; findings below may reference images not displayed]

ACR Breast Density Category c: The breast tissue is heterogeneously
dense, which may obscure small masses.
FINDINGS: There are no findings suspicious for malignancy. Images were
processed with CAD.
IMPRESSION: No mammographic evidence of malignancy. A result letter of this
screening mammogram will be mailed directly to the patient.

RECOMMENDATION:
Screening mammogram in one year. (Code:YJ-[id number])

BI-RADS CATEGORY  1: Negative.

## 2017-05-28 ENCOUNTER — Encounter: Payer: Self-pay | Admitting: Family Medicine

## 2017-05-28 ENCOUNTER — Ambulatory Visit (INDEPENDENT_AMBULATORY_CARE_PROVIDER_SITE_OTHER): Payer: BLUE CROSS/BLUE SHIELD | Admitting: Family Medicine

## 2017-05-28 VITALS — BP 122/78 | HR 80 | Temp 97.6°F | Resp 16 | Ht 67.0 in | Wt 268.7 lb

## 2017-05-28 DIAGNOSIS — E785 Hyperlipidemia, unspecified: Secondary | ICD-10-CM | POA: Diagnosis not present

## 2017-05-28 DIAGNOSIS — E1142 Type 2 diabetes mellitus with diabetic polyneuropathy: Secondary | ICD-10-CM

## 2017-05-28 DIAGNOSIS — E213 Hyperparathyroidism, unspecified: Secondary | ICD-10-CM

## 2017-05-28 DIAGNOSIS — E1169 Type 2 diabetes mellitus with other specified complication: Secondary | ICD-10-CM

## 2017-05-28 DIAGNOSIS — Z23 Encounter for immunization: Secondary | ICD-10-CM

## 2017-05-28 DIAGNOSIS — E1140 Type 2 diabetes mellitus with diabetic neuropathy, unspecified: Secondary | ICD-10-CM

## 2017-05-28 DIAGNOSIS — E039 Hypothyroidism, unspecified: Secondary | ICD-10-CM | POA: Diagnosis not present

## 2017-05-28 DIAGNOSIS — I10 Essential (primary) hypertension: Secondary | ICD-10-CM | POA: Diagnosis not present

## 2017-05-28 DIAGNOSIS — R600 Localized edema: Secondary | ICD-10-CM

## 2017-05-28 DIAGNOSIS — J410 Simple chronic bronchitis: Secondary | ICD-10-CM

## 2017-05-28 LAB — POCT GLYCOSYLATED HEMOGLOBIN (HGB A1C): HEMOGLOBIN A1C: 5.5

## 2017-05-28 MED ORDER — AMLODIPINE BESY-BENAZEPRIL HCL 5-40 MG PO CAPS: 1.0000 | ORAL_CAPSULE | Freq: Every day | ORAL | 1 refills | Status: DC

## 2017-05-28 MED ORDER — FUROSEMIDE 20 MG PO TABS: 20.0000 mg | ORAL_TABLET | Freq: Every day | ORAL | 1 refills | Status: DC

## 2017-05-28 MED ORDER — METOPROLOL SUCCINATE ER 200 MG PO TB24: 200.0000 mg | ORAL_TABLET | Freq: Every day | ORAL | 1 refills | Status: DC

## 2017-05-28 MED ORDER — GABAPENTIN 300 MG PO CAPS: 300.0000 mg | ORAL_CAPSULE | Freq: Three times a day (TID) | ORAL | 1 refills | Status: DC

## 2017-05-28 MED ORDER — LEVOTHYROXINE SODIUM 137 MCG PO TABS: ORAL_TABLET | ORAL | 2 refills | Status: DC

## 2017-05-28 MED ORDER — ATORVASTATIN CALCIUM 40 MG PO TABS: 40.0000 mg | ORAL_TABLET | Freq: Every day | ORAL | 1 refills | Status: DC

## 2017-05-28 NOTE — Progress Notes (Signed)
Name: Charlene Hardy   MRN: [id number]    DOB: 1956-05-03   Date:05/28/2017       Progress Note  Subjective  Chief Complaint  Chief Complaint  Patient presents with  . Medication Refill    4 month F/U  . Diabetes    Has not checked in awhile  . Hypertension    Denies any symptoms  . Hyperlipidemia  . Hypothyroidism    Dry Skin and Hair Loss    HPI  DMII: taking Metformin and Trulicity, last eye exam no diabetic retinopathy in 2017, repeat eye was 08/2016. She has peripheral neuropathy - only lack of sensation - severe bilateral foot pain that is currently controlled with Gabapentin.  She is compliant with medication, denies polyphagia, polyuria but she has polydipsia ( we will give her a note to have water near her at work - likely hyperparathyroidism  ). HgbA1C is at goal and no protein in urine. We will stop Metformin   Obesity: with multiple co-morbidities, also bilateral knee pain, weight is stable, on Trulicity, I will stop metformin now since hgbA1C has been at goal, and monitor, she needs to eat /snack on healthy items ( celery, fruit) because she is currently trying to quit smoking.   Bilateral knee arthralgias: switch from left to right intermittent knee pain. She has been on Meloxicam because of kidney function and is taking Tylenol and Gabapentin, with good control of symptoms Pain level right now is zero , while resting  Chronic bronchitis: She is still smoking and not ready to quit. She denies SOB but has a daily dry cough, but stable, she states Combivent seems to help with symptoms, she started using Nicotine patch October 1 st, and is down to one cigarette daily, explained that cough may get worse initially.    HTN: she is taking medications as prescribed, bp is at goal today. No chest pain, SOB  or palpitation. She also has lower extremity edema, she can't take HCTZ because it causes hypercalcemia but is doing well on lasix . She is also on Metoprolol.    Dyslipidemia: low HDL, on Atorvastatin and otc fish oil . No myalgias, we will recheck labs today   Hypothyroidism: last TSH was back to normal , she is back on 137 mcg daily   Hyperparathyroidism: seeing Endocrinologist, we will recheck labs and send to him.    Patient Active Problem List   Diagnosis Date Noted  . Primary hyperparathyroidism ([place]) 02/28/2016  . Arthralgia of both knees 10/23/2015  . Chronic bronchitis ([place]) 10/23/2015  . Osteopenia 10/23/2015  . Left ankle pain 04/25/2015  . Constipation 04/25/2015  . Allergic rhinitis 01/20/2015  . 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/2015  . Diabetic sensorimotor neuropathy ([place]) 01/20/2015  . Dyslipidemia 01/20/2015  . Essential (primary) hypertension 01/20/2015  . H/O iron deficiency anemia 01/20/2015  . Hypertensive retinopathy 01/20/2015  . Adult hypothyroidism 01/20/2015  . Extreme obesity 01/20/2015  . Background retinopathy due to secondary diabetes ([place]) 01/20/2015  . Tinea pedis 01/20/2015  . Vitamin D deficiency 02/26/2010  . Cigarette smoker 03/06/2008    Past Surgical History:  Procedure Laterality Date  . ABDOMINAL HYSTERECTOMY  08/11/1999   still has ovaries  . COLONOSCOPY  08/11/2015  . COLONOSCOPY WITH PROPOFOL N/A 06/10/2016   Procedure: COLONOSCOPY WITH PROPOFOL;  Surgeon: Christene Lye, MD;  Location: [facility] ENDOSCOPY;  Service: Endoscopy;  Laterality: N/A;    Family History  Problem Relation Age of Onset  . Multiple sclerosis Mother   .  Diabetes Father   . Breast cancer Sister 51  . Hypertension Maternal Grandmother   . Thyroid disease Paternal Uncle   . Breast cancer Paternal Aunt     Social History   Social History  . Marital status: Single    Spouse name: N/A  . Number of children: N/A  . Years of education: N/A   Occupational History  . Not on file.   Social History Main Topics  . Smoking status: Current Every Day Smoker    Packs/day: 1.00    Years: 40.00    Types:  Cigarettes    Start date: 08/10/1976  . Smokeless tobacco: Never Used  . Alcohol use No  . Drug use: No  . Sexual activity: Not Currently   Other Topics Concern  . Not on file   Social History Narrative  . No narrative on file     Current Outpatient Prescriptions:  .  acetaminophen (TYLENOL) 500 MG tablet, Take 1 tablet (500 mg total) by mouth 2 (two) times daily., Disp: 30 tablet, Rfl: 0 .  aspirin 81 MG tablet, Take 1 tablet by mouth daily., Disp: , Rfl:  .  Cholecalciferol (VITAMIN D) 2000 UNITS tablet, Take 1 tablet by mouth daily., Disp: , Rfl:  .  Dulaglutide (TRULICITY) 1.5 AL/9.3XT SOPN, Inject 1.5 mg into the skin once a week., Disp: 4 pen, Rfl: 2 .  ferrous sulfate 325 (65 FE) MG tablet, Take 1 tablet by mouth daily., Disp: , Rfl:  .  Ipratropium-Albuterol (COMBIVENT RESPIMAT) 20-100 MCG/ACT AERS respimat, Inhale 1 puff into the lungs every 6 (six) hours as needed for wheezing., Disp: 1 Inhaler, Rfl: 2 .  MULTIPLE VITAMINS-MINERALS ER PO, Take 1 tablet by mouth daily., Disp: , Rfl:  .  naftifine (NAFTIN) 1 % cream, Apply topically daily., Disp: 90 g, Rfl: 1 .  nicotine (NICODERM CQ - DOSED IN MG/24 HOURS) 21 mg/24hr patch, , Disp: , Rfl: 0 .  omega-3 fish oil (MAXEPA) 1000 MG CAPS capsule, Take 1 capsule by mouth daily., Disp: , Rfl:  .  Polyethylene Glycol 3350 POWD, Take 1 Dose by mouth as needed., Disp: , Rfl:  .  potassium chloride SA (K-DUR,KLOR-CON) 20 MEQ tablet, Take 1 tablet (20 mEq total) by mouth daily., Disp: 90 tablet, Rfl: 1 .  amLODipine-benazepril (LOTREL) 5-40 MG capsule, Take 1 capsule by mouth daily., Disp: 90 capsule, Rfl: 1 .  atorvastatin (LIPITOR) 40 MG tablet, Take 1 tablet (40 mg total) by mouth daily. In place of Pravastatin for cholesterol, Disp: 90 tablet, Rfl: 1 .  furosemide (LASIX) 20 MG tablet, Take 1 tablet (20 mg total) by mouth daily., Disp: 90 tablet, Rfl: 1 .  gabapentin (NEURONTIN) 300 MG capsule, Take 1 capsule (300 mg total) by mouth 3  (three) times daily. Gradually go up , to a goal of 300 mg three times daily, Disp: 270 capsule, Rfl: 1 .  levothyroxine (SYNTHROID, LEVOTHROID) 137 MCG tablet, TAKE 1 TABLET BY MOUTH ONCE DAILY BEFORE BREAKFAST, Disp: 30 tablet, Rfl: 2 .  metoprolol (TOPROL-XL) 200 MG 24 hr tablet, Take 1 tablet (200 mg total) by mouth daily., Disp: 90 tablet, Rfl: 1  Allergies  Allergen Reactions  . Hydrochlorothiazide     hyperparathyroidism  . Pneumococcal Vaccine Rash  . Pneumovax [Pneumococcal Polysaccharide Vaccine]      ROS  Constitutional: Negative for fever or weight change.  Respiratory: Positive  for cough but no shortness of breath.   Cardiovascular: Negative for chest pain or palpitations.  Gastrointestinal: Negative for abdominal pain, no bowel changes.  Musculoskeletal: Negative for gait problem or joint swelling.  Skin: Negative for rash.  Neurological: Negative for dizziness or headache.  No other specific complaints in a complete review of systems (except as listed in HPI above).  Objective  Vitals:   05/28/17 0853  BP: 122/78  Pulse: 80  Resp: 16  Temp: 97.6 F (36.4 C)  TempSrc: Oral  SpO2: 99%  Weight: 268 lb 11.2 oz (121.9 kg)  Height: 5\' 7"  (1.702 m)    Body mass index is 42.08 kg/m.  Physical Exam  Constitutional: Patient appears well-developed and well-nourished. Obese  No distress.  HEENT: head atraumatic, normocephalic, pupils equal and reactive to light, neck supple, throat within normal limits, no thyromegaly Cardiovascular: Normal rate, regular rhythm and normal heart sounds.  No murmur heard. No BLE edema. Pulmonary/Chest: Effort normal and breath sounds normal. No respiratory distress. Abdominal: Soft.  There is no tenderness. Psychiatric: Patient has a normal mood and affect. behavior is normal. Judgment and thought content normal.   PHQ2/9: Depression screen [facility] 2/9 05/28/2017 01/28/2017 11/02/2016 05/25/2016 02/28/2016  Decreased Interest 0 0 0 0  0  Down, Depressed, Hopeless 0 0 0 0 0  PHQ - 2 Score 0 0 0 0 0     Fall Risk: Fall Risk  05/28/2017 01/28/2017 11/02/2016 05/25/2016 02/28/2016  Falls in the past year? No No No No No     Functional Status Survey: Is the patient deaf or have difficulty hearing?: No Does the patient have difficulty seeing, even when wearing glasses/contacts?: No Does the patient have difficulty concentrating, remembering, or making decisions?: No Does the patient have difficulty walking or climbing stairs?: No Does the patient have difficulty dressing or bathing?: No Does the patient have difficulty doing errands alone such as visiting a doctor's office or shopping?: No   Assessment & Plan  1. Diabetic sensorimotor neuropathy (HCC)  - POCT HgB A1C - gabapentin (NEURONTIN) 300 MG capsule; Take 1 capsule (300 mg total) by mouth 3 (three) times daily. Gradually go up , to a goal of 300 mg three times daily  Dispense: 270 capsule; Refill: 1  2. Essential (primary) hypertension  - amLODipine-benazepril (LOTREL) 5-40 MG capsule; Take 1 capsule by mouth daily.  Dispense: 90 capsule; Refill: 1 - metoprolol (TOPROL-XL) 200 MG 24 hr tablet; Take 1 tablet (200 mg total) by mouth daily.  Dispense: 90 tablet; Refill: 1 - COMPLETE METABOLIC PANEL WITH GFR  3. Dyslipidemia associated with type 2 diabetes mellitus (HCC)  - atorvastatin (LIPITOR) 40 MG tablet; Take 1 tablet (40 mg total) by mouth daily. In place of Pravastatin for cholesterol  Dispense: 90 tablet; Refill: 1 - COMPLETE METABOLIC PANEL WITH GFR - Lipid panel  4. Bilateral edema of lower extremity  - furosemide (LASIX) 20 MG tablet; Take 1 tablet (20 mg total) by mouth daily.  Dispense: 90 tablet; Refill: 1  5. Need for immunization against influenza  - Flu Vaccine QUAD 6+ mos PF IM (Fluarix Quad PF)  6. Acquired hypothyroidism  - levothyroxine (SYNTHROID, LEVOTHROID) 137 MCG tablet; TAKE 1 TABLET BY MOUTH ONCE DAILY BEFORE BREAKFAST   Dispense: 30 tablet; Refill: 2 - TSH  7. Hyperparathyroidism ([place])  - Parathyroid hormone, intact (no Ca) Sees Endo  9. Simple chronic bronchitis (HCC)  Using inhaler prn, trying to quit smoking, currently using nicotine patch, down to one cigarette daily

## 2017-05-29 ENCOUNTER — Other Ambulatory Visit: Payer: Self-pay | Admitting: Family Medicine

## 2017-05-29 DIAGNOSIS — R600 Localized edema: Secondary | ICD-10-CM

## 2017-05-31 LAB — COMPLETE METABOLIC PANEL WITH GFR
AG RATIO: 1.4 (calc) (ref 1.0–2.5)
ALBUMIN MSPROF: 4.3 g/dL (ref 3.6–5.1)
ALT: 12 U/L (ref 6–29)
AST: 14 U/L (ref 10–35)
Alkaline phosphatase (APISO): 106 U/L (ref 33–130)
BILIRUBIN TOTAL: 0.6 mg/dL (ref 0.2–1.2)
BUN: 11 mg/dL (ref 7–25)
CHLORIDE: 101 mmol/L (ref 98–110)
CO2: 29 mmol/L (ref 20–32)
Calcium: 10.8 mg/dL — ABNORMAL HIGH (ref 8.6–10.4)
Creat: 0.7 mg/dL (ref 0.50–0.99)
GFR, EST AFRICAN AMERICAN: 108 mL/min/{1.73_m2} (ref >=60)
GFR, EST NON AFRICAN AMERICAN: 94 mL/min/{1.73_m2} (ref >=60)
GLOBULIN: 3 g/dL (ref 1.9–3.7)
Glucose, Bld: 81 mg/dL (ref 65–99)
POTASSIUM: 4.6 mmol/L (ref 3.5–5.3)
Sodium: 138 mmol/L (ref 135–146)
TOTAL PROTEIN: 7.3 g/dL (ref 6.1–8.1)

## 2017-05-31 LAB — LIPID PANEL
Cholesterol: 218 mg/dL — ABNORMAL HIGH (ref <200)
HDL: 45 mg/dL — ABNORMAL LOW (ref >50)
LDL Cholesterol (Calc): 142 mg/dL (calc) — ABNORMAL HIGH
Non-HDL Cholesterol (Calc): 173 mg/dL (calc) — ABNORMAL HIGH (ref <130)
Total CHOL/HDL Ratio: 4.8 (calc) (ref <5.0)
Triglycerides: 173 mg/dL — ABNORMAL HIGH (ref <150)

## 2017-05-31 LAB — PARATHYROID HORMONE, INTACT (NO CA): PTH: 25 pg/mL (ref 14–64)

## 2017-05-31 LAB — TSH: TSH: 31.41 m[IU]/L — AB (ref 0.40–4.50)

## 2017-06-14 ENCOUNTER — Other Ambulatory Visit: Payer: Self-pay | Admitting: Family Medicine

## 2017-06-14 DIAGNOSIS — Z1231 Encounter for screening mammogram for malignant neoplasm of breast: Secondary | ICD-10-CM

## 2017-07-08 ENCOUNTER — Ambulatory Visit
Admission: RE | Admit: 2017-07-08 | Discharge: 2017-07-08 | Disposition: A | Discharge status: Home / Self Care | Payer: BLUE CROSS/BLUE SHIELD | Source: Ambulatory Visit | Attending: Family Medicine | Admitting: Family Medicine

## 2017-07-08 DIAGNOSIS — Z1231 Encounter for screening mammogram for malignant neoplasm of breast: Secondary | ICD-10-CM | POA: Diagnosis not present

## 2017-07-16 DIAGNOSIS — H52223 Regular astigmatism, bilateral: Secondary | ICD-10-CM | POA: Diagnosis not present

## 2017-07-16 DIAGNOSIS — H5203 Hypermetropia, bilateral: Secondary | ICD-10-CM | POA: Diagnosis not present

## 2017-07-16 DIAGNOSIS — H524 Presbyopia: Secondary | ICD-10-CM | POA: Diagnosis not present

## 2017-07-16 LAB — HM DIABETES EYE EXAM

## 2017-08-24 ENCOUNTER — Encounter: Payer: Self-pay | Admitting: Family Medicine

## 2017-08-24 ENCOUNTER — Ambulatory Visit: Payer: BLUE CROSS/BLUE SHIELD | Admitting: Family Medicine

## 2017-08-24 VITALS — BP 140/78 | HR 63 | Resp 16 | Ht 67.0 in | Wt 261.7 lb

## 2017-08-24 DIAGNOSIS — J410 Simple chronic bronchitis: Secondary | ICD-10-CM

## 2017-08-24 DIAGNOSIS — E213 Hyperparathyroidism, unspecified: Secondary | ICD-10-CM

## 2017-08-24 DIAGNOSIS — Z716 Tobacco abuse counseling: Secondary | ICD-10-CM

## 2017-08-24 DIAGNOSIS — I10 Essential (primary) hypertension: Secondary | ICD-10-CM | POA: Diagnosis not present

## 2017-08-24 DIAGNOSIS — E039 Hypothyroidism, unspecified: Secondary | ICD-10-CM

## 2017-08-24 DIAGNOSIS — E785 Hyperlipidemia, unspecified: Secondary | ICD-10-CM | POA: Diagnosis not present

## 2017-08-24 DIAGNOSIS — E1142 Type 2 diabetes mellitus with diabetic polyneuropathy: Secondary | ICD-10-CM | POA: Diagnosis not present

## 2017-08-24 DIAGNOSIS — R600 Localized edema: Secondary | ICD-10-CM

## 2017-08-24 DIAGNOSIS — E1169 Type 2 diabetes mellitus with other specified complication: Secondary | ICD-10-CM

## 2017-08-24 LAB — TSH: TSH: 7.78 m[IU]/L — AB (ref 0.40–4.50)

## 2017-08-24 LAB — POCT GLYCOSYLATED HEMOGLOBIN (HGB A1C): Hemoglobin A1C: 5.9

## 2017-08-24 MED ORDER — NICOTINE 14 MG/24HR TD PT24: 14.0000 mg | MEDICATED_PATCH | Freq: Every day | TRANSDERMAL | 0 refills | Status: DC

## 2017-08-24 NOTE — Progress Notes (Signed)
Name: Charlene Hardy   MRN: [id number]    DOB: 11/25/1955   Date:08/24/2017       Progress Note  Subjective  Chief Complaint  Chief Complaint  Patient presents with  . Diabetes  . Hypertension    HPI   DMII: she has been off Metformin since last visit , on Trulicity only and [id number] is still at goal, today at 5.9%. She is compliant with medications, , last eye exam no diabetic retinopathy 07/2017 - we will obtain report . She has peripheral neuropathy - only lack of sensation - severe bilateral foot pain that is currently controlled with Gabapentin. She denies polyphagia,but she has polydipsia ( we will give her a note to have water near her at work - likely hyperparathyroidism ), she also has nocturia .   Obesity: with multiple co-morbidities, also bilateral knee pain, weight is stable, on Trulicity, she is doing better with her diet, lost weight since last visit   Bilateral knee arthralgias: switch from left to right intermittent knee pain. She has been on Meloxicam because of kidney function and is taking Tylenol and Gabapentin, with good control of symptoms Pain level right now is zero , while resting. She has left foot pain , but pain is mild and she does not want to see podiatrist at this time. She states triggered by standing long hours at work.   Chronic bronchitis: She is still smoking, using nicotine patch and is down to 3 cigarettes at day  ( after meals is when she craves it), advised to replace with something else ( lolly pop, gum, veggies, sparkling water). . She denies SOB but has a daily dry cough, but stable, she states Combivent seems to help with symptoms.  HTN: she is taking medications as prescribed, bp  Today is slightly higher than normal for her.  No chest pain, SOB  or palpitation. She also has lower extremity edema, she can't take HCTZ because it causes hypercalcemia but is doing well on lasix She is also on Metoprolol.   Dyslipidemia: low HDL, on Atorvastatin  and otc fish oil . No myalgias, last labs were not at goal, but THS was also off, she had labs done at work last week and will drop off results to me  Hypothyroidism: last TSH was very high on her last visit, she is back on 137 mcg daily - she has been compliant with medications  Hyperparathyroidism: seeing Endocrinologist, has follow up in March     Patient Active Problem List   Diagnosis Date Noted  . Primary hyperparathyroidism ([place]) 02/28/2016  . Arthralgia of both knees 10/23/2015  . Chronic bronchitis ([place]) 10/23/2015  . Osteopenia 10/23/2015  . Left ankle pain 04/25/2015  . Constipation 04/25/2015  . Allergic rhinitis 01/20/2015  . 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/2015  . Diabetic sensorimotor neuropathy ([place]) 01/20/2015  . Dyslipidemia 01/20/2015  . Essential (primary) hypertension 01/20/2015  . H/O iron deficiency anemia 01/20/2015  . Hypertensive retinopathy 01/20/2015  . Adult hypothyroidism 01/20/2015  . Extreme obesity 01/20/2015  . Background retinopathy due to secondary diabetes ([place]) 01/20/2015  . Tinea pedis 01/20/2015  . Vitamin D deficiency 02/26/2010  . Cigarette smoker 03/06/2008    Past Surgical History:  Procedure Laterality Date  . ABDOMINAL HYSTERECTOMY  08/11/1999   still has ovaries  . COLONOSCOPY  08/11/2015  . COLONOSCOPY WITH PROPOFOL N/A 06/10/2016   Procedure: COLONOSCOPY WITH PROPOFOL;  Surgeon: Christene Lye, MD;  Location: [facility] ENDOSCOPY;  Service: Endoscopy;  Laterality: N/A;  Family History  Problem Relation Age of Onset  . Multiple sclerosis Mother   . Diabetes Father   . Breast cancer Sister 18  . Hypertension Maternal Grandmother   . Thyroid disease Paternal Uncle   . Breast cancer Paternal Aunt     Social History   Socioeconomic History  . Marital status: Single    Spouse name: Not on file  . Number of children: Not on file  . Years of education: Not on file  . Highest education level: Not on file  Social Needs   . Financial resource strain: Not on file  . Food insecurity - worry: Not on file  . Food insecurity - inability: Not on file  . Transportation needs - medical: Not on file  . Transportation needs - non-medical: Not on file  Occupational History  . Not on file  Tobacco Use  . Smoking status: Current Every Day Smoker    Packs/day: 1.00    Years: 40.00    Pack years: 40.00    Types: Cigarettes    Start date: 08/10/1976  . Smokeless tobacco: Never Used  Substance and Sexual Activity  . Alcohol use: No    Alcohol/week: 0.0 oz  . Drug use: No  . Sexual activity: Not Currently  Other Topics Concern  . Not on file  Social History Narrative  . Not on file     Current Outpatient Medications:  .  acetaminophen (TYLENOL) 500 MG tablet, Take 1 tablet (500 mg total) by mouth 2 (two) times daily., Disp: 30 tablet, Rfl: 0 .  amLODipine-benazepril (LOTREL) 5-40 MG capsule, Take 1 capsule by mouth daily., Disp: 90 capsule, Rfl: 1 .  aspirin 81 MG tablet, Take 1 tablet by mouth daily., Disp: , Rfl:  .  atorvastatin (LIPITOR) 40 MG tablet, Take 1 tablet (40 mg total) by mouth daily. In place of Pravastatin for cholesterol, Disp: 90 tablet, Rfl: 1 .  Cholecalciferol (VITAMIN D) 2000 UNITS tablet, Take 1 tablet by mouth daily., Disp: , Rfl:  .  Dulaglutide (TRULICITY) 1.5 CZ/6.6AY SOPN, Inject 1.5 mg into the skin once a week., Disp: 4 pen, Rfl: 2 .  ferrous sulfate 325 (65 FE) MG tablet, Take 1 tablet by mouth daily., Disp: , Rfl:  .  furosemide (LASIX) 20 MG tablet, Take 1 tablet (20 mg total) by mouth daily., Disp: 90 tablet, Rfl: 1 .  gabapentin (NEURONTIN) 300 MG capsule, Take 1 capsule (300 mg total) by mouth 3 (three) times daily. Gradually go up , to a goal of 300 mg three times daily, Disp: 270 capsule, Rfl: 1 .  Ipratropium-Albuterol (COMBIVENT RESPIMAT) 20-100 MCG/ACT AERS respimat, Inhale 1 puff into the lungs every 6 (six) hours as needed for wheezing., Disp: 1 Inhaler, Rfl: 2 .   KLOR-CON M20 20 MEQ tablet, TAKE ONE TABLET BY MOUTH ONCE DAILY, Disp: 90 tablet, Rfl: 1 .  levothyroxine (SYNTHROID, LEVOTHROID) 137 MCG tablet, TAKE 1 TABLET BY MOUTH ONCE DAILY BEFORE BREAKFAST, Disp: 30 tablet, Rfl: 2 .  metoprolol (TOPROL-XL) 200 MG 24 hr tablet, Take 1 tablet (200 mg total) by mouth daily., Disp: 90 tablet, Rfl: 1 .  MULTIPLE VITAMINS-MINERALS ER PO, Take 1 tablet by mouth daily., Disp: , Rfl:  .  naftifine (NAFTIN) 1 % cream, Apply topically daily., Disp: 90 g, Rfl: 1 .  omega-3 fish oil (MAXEPA) 1000 MG CAPS capsule, Take 1 capsule by mouth daily., Disp: , Rfl:  .  Polyethylene Glycol 3350 POWD, Take 1 Dose by mouth  as needed., Disp: , Rfl:  .  nicotine (NICODERM CQ - DOSED IN MG/24 HOURS) 14 mg/24hr patch, Place 1 patch (14 mg total) onto the skin daily., Disp: 28 patch, Rfl: 0  Allergies  Allergen Reactions  . Hydrochlorothiazide     hyperparathyroidism  . Pneumococcal Vaccine Rash  . Pneumovax [Pneumococcal Polysaccharide Vaccine]      ROS  Constitutional: Negative for fever , positive for  weight change.  Respiratory:Positive for daily cough but no  shortness of breath.   Cardiovascular: Negative for chest pain or palpitations.  Gastrointestinal: Negative for abdominal pain, no bowel changes.  Musculoskeletal: Negative for gait problem or joint swelling.  Skin: Negative for rash.  Neurological: Negative for dizziness , mild intermittent  headache.  No other specific complaints in a complete review of systems (except as listed in HPI above).  Objective  Vitals:   08/24/17 0922  BP: 140/78  Pulse: 63  Resp: 16  SpO2: 95%  Weight: 261 lb 11.2 oz (118.7 kg)  Height: 5\' 7"  (1.702 m)    Body mass index is 40.99 kg/m.  Physical Exam  Constitutional: Patient appears well-developed and well-nourished. Obese  No distress.  HEENT: head atraumatic, normocephalic, pupils equal and reactive to light,neck supple, throat within normal  limits Cardiovascular: Normal rate, regular rhythm and normal heart sounds.  No murmur heard. Trace  BLE edema. Pulmonary/Chest: Effort normal and breath sounds normal. No respiratory distress. Abdominal: Soft.  There is no tenderness. Psychiatric: Patient has a normal mood and affect. behavior is normal. Judgment and thought content normal.  Recent Results (from the past 2160 hour(s))  POCT HgB A1C     Status: Normal   Collection Time: 05/28/17  9:29 AM  Result Value Ref Range   Hemoglobin A1C 5.5   COMPLETE METABOLIC PANEL WITH GFR     Status: Abnormal   Collection Time: 05/28/17  9:34 AM  Result Value Ref Range   Glucose, Bld 81 65 - 99 mg/dL    Comment: .            Fasting reference interval .    BUN 11 7 - 25 mg/dL   Creat 0.70 0.50 - 0.99 mg/dL    Comment: For patients >48 years of age, the reference limit for Creatinine is approximately 13% higher for people identified as African-American. .    GFR, Est Non African American 94 > OR = 60 mL/min/1.14m2   GFR, Est African American 108 > OR = 60 mL/min/1.53m2   BUN/Creatinine Ratio NOT APPLICABLE 6 - 22 (calc)   Sodium 138 135 - 146 mmol/L   Potassium 4.6 3.5 - 5.3 mmol/L   Chloride 101 98 - 110 mmol/L   CO2 29 20 - 32 mmol/L   Calcium 10.8 (H) 8.6 - 10.4 mg/dL   Total Protein 7.3 6.1 - 8.1 g/dL   Albumin 4.3 3.6 - 5.1 g/dL   Globulin 3.0 1.9 - 3.7 g/dL (calc)   AG Ratio 1.4 1.0 - 2.5 (calc)   Total Bilirubin 0.6 0.2 - 1.2 mg/dL   Alkaline phosphatase (APISO) 106 33 - 130 U/L   AST 14 10 - 35 U/L   ALT 12 6 - 29 U/L  Lipid panel     Status: Abnormal   Collection Time: 05/28/17  9:34 AM  Result Value Ref Range   Cholesterol 218 (H) <200 mg/dL   HDL 45 (L) >50 mg/dL   Triglycerides 173 (H) <150 mg/dL   LDL Cholesterol (Calc) 142 (H) mg/dL (  calc)    Comment: Reference range: <100 . Desirable range <100 mg/dL for primary prevention;   <70 mg/dL for patients with CHD or diabetic patients  with > or = 2 CHD risk  factors. Marland Kitchen LDL-C is now calculated using the Martin-Hopkins  calculation, which is a validated novel method providing  better accuracy than the Friedewald equation in the  estimation of LDL-C.  Cresenciano Genre et al. Annamaria Helling. 1096;045(40): 2061-2068  (http://education.[employer].com/faq/FAQ164)    Total CHOL/HDL Ratio 4.8 <5.0 (calc)   Non-HDL Cholesterol (Calc) 173 (H) <130 mg/dL (calc)    Comment: For patients with diabetes plus 1 major ASCVD risk  factor, treating to a non-HDL-C goal of <100 mg/dL  (LDL-C of <70 mg/dL) is considered a therapeutic  option.   TSH     Status: Abnormal   Collection Time: 05/28/17  9:34 AM  Result Value Ref Range   TSH 31.41 (H) 0.40 - 4.50 mIU/L  Parathyroid hormone, intact (no Ca)     Status: None   Collection Time: 05/28/17  9:34 AM  Result Value Ref Range   PTH 25 14 - 64 pg/mL    Comment: . Interpretive Guide    Intact PTH           Calcium ------------------    ----------           ------- Normal Parathyroid    Normal               Normal Hypoparathyroidism    Low or Low Normal    Low Hyperparathyroidism    Primary            Normal or High       High    Secondary          High                 Normal or Low    Tertiary           High                 High Non-Parathyroid    Hypercalcemia      Low or Low Normal    High .   POCT HgB A1C     Status: Abnormal   Collection Time: 08/24/17  9:41 AM  Result Value Ref Range   Hemoglobin A1C 5.9       PHQ2/9: Depression screen [facility] 2/9 05/28/2017 01/28/2017 11/02/2016 05/25/2016 02/28/2016  Decreased Interest 0 0 0 0 0  Down, Depressed, Hopeless 0 0 0 0 0  PHQ - 2 Score 0 0 0 0 0     Fall Risk: Fall Risk  08/24/2017 05/28/2017 01/28/2017 11/02/2016 05/25/2016  Falls in the past year? No No No No No     Functional Status Survey: Is the patient deaf or have difficulty hearing?: No Does the patient have difficulty seeing, even when wearing glasses/contacts?: No Does the patient have  difficulty concentrating, remembering, or making decisions?: No Does the patient have difficulty walking or climbing stairs?: No Does the patient have difficulty dressing or bathing?: No Does the patient have difficulty doing errands alone such as visiting a doctor's office or shopping?: No    Assessment & Plan  1. Diabetic sensorimotor neuropathy (HCC)  - POCT HgB A1C  2. Encounter for tobacco use cessation counseling  - nicotine (NICODERM CQ - DOSED IN MG/24 HOURS) 14 mg/24hr patch; Place 1 patch (14 mg total) onto the skin daily.  Dispense: 28 patch;  Refill: 0  3. Simple chronic bronchitis ([place])  She is down to 3 cigarettes daily   4. Hyperparathyroidism ([place])  She has follow up with Endo coming up   5. Essential (primary) hypertension   bp is slightly elevated today, but usually at goal, we will monitor for now  6. Dyslipidemia associated with type 2 diabetes mellitus ([place])  On Atorvastatin now and is doing well   7. Bilateral edema of lower extremity  Trace edema, doing well  8. Morbid obesity ([place])  Discussed with the patient the risk posed by an increased BMI. Discussed importance of portion control, calorie counting and at least 150 minutes of physical activity weekly. Avoid sweet beverages and drink more water. Eat at least 6 servings of fruit and vegetables daily   9. Acquired hypothyroidism  - TSH

## 2017-08-25 ENCOUNTER — Other Ambulatory Visit: Payer: Self-pay | Admitting: Family Medicine

## 2017-08-25 MED ORDER — LEVOTHYROXINE SODIUM 150 MCG PO TABS: 150.0000 ug | ORAL_TABLET | Freq: Every day | ORAL | 3 refills | Status: DC

## 2017-08-25 MED ORDER — LEVOTHYROXINE SODIUM 150 MCG PO TABS: 150.0000 ug | ORAL_TABLET | Freq: Every day | ORAL | 0 refills | Status: DC

## 2017-08-27 ENCOUNTER — Ambulatory Visit: Payer: BLUE CROSS/BLUE SHIELD | Admitting: Family Medicine

## 2017-08-31 ENCOUNTER — Encounter: Payer: Self-pay | Admitting: Family Medicine

## 2017-09-01 ENCOUNTER — Encounter: Payer: Self-pay | Admitting: Family Medicine

## 2017-10-20 DIAGNOSIS — E039 Hypothyroidism, unspecified: Secondary | ICD-10-CM | POA: Diagnosis not present

## 2017-10-20 DIAGNOSIS — E210 Primary hyperparathyroidism: Secondary | ICD-10-CM | POA: Diagnosis not present

## 2017-10-27 DIAGNOSIS — E210 Primary hyperparathyroidism: Secondary | ICD-10-CM | POA: Diagnosis not present

## 2017-11-12 ENCOUNTER — Encounter: Payer: BLUE CROSS/BLUE SHIELD | Admitting: Family Medicine

## 2017-12-23 DIAGNOSIS — H5213 Myopia, bilateral: Secondary | ICD-10-CM | POA: Diagnosis not present

## 2018-01-31 ENCOUNTER — Other Ambulatory Visit: Payer: Self-pay | Admitting: Family Medicine

## 2018-01-31 DIAGNOSIS — R600 Localized edema: Secondary | ICD-10-CM

## 2018-02-24 ENCOUNTER — Encounter: Payer: Self-pay | Admitting: Family Medicine

## 2018-02-24 ENCOUNTER — Ambulatory Visit (INDEPENDENT_AMBULATORY_CARE_PROVIDER_SITE_OTHER): Payer: BLUE CROSS/BLUE SHIELD | Admitting: Family Medicine

## 2018-02-24 VITALS — BP 132/68 | HR 76 | Temp 98.1°F | Resp 14 | Ht 67.0 in | Wt 257.8 lb

## 2018-02-24 DIAGNOSIS — R9431 Abnormal electrocardiogram [ECG] [EKG]: Secondary | ICD-10-CM

## 2018-02-24 DIAGNOSIS — I10 Essential (primary) hypertension: Secondary | ICD-10-CM

## 2018-02-24 DIAGNOSIS — E039 Hypothyroidism, unspecified: Secondary | ICD-10-CM | POA: Diagnosis not present

## 2018-02-24 DIAGNOSIS — E1169 Type 2 diabetes mellitus with other specified complication: Secondary | ICD-10-CM

## 2018-02-24 DIAGNOSIS — E1140 Type 2 diabetes mellitus with diabetic neuropathy, unspecified: Secondary | ICD-10-CM

## 2018-02-24 DIAGNOSIS — D72829 Elevated white blood cell count, unspecified: Secondary | ICD-10-CM

## 2018-02-24 DIAGNOSIS — Z01419 Encounter for gynecological examination (general) (routine) without abnormal findings: Secondary | ICD-10-CM

## 2018-02-24 DIAGNOSIS — Z01411 Encounter for gynecological examination (general) (routine) with abnormal findings: Secondary | ICD-10-CM

## 2018-02-24 DIAGNOSIS — J410 Simple chronic bronchitis: Secondary | ICD-10-CM | POA: Diagnosis not present

## 2018-02-24 DIAGNOSIS — E210 Primary hyperparathyroidism: Secondary | ICD-10-CM

## 2018-02-24 DIAGNOSIS — E785 Hyperlipidemia, unspecified: Secondary | ICD-10-CM | POA: Diagnosis not present

## 2018-02-24 MED ORDER — LEVOTHYROXINE SODIUM 150 MCG PO TABS: 150.0000 ug | ORAL_TABLET | Freq: Every day | ORAL | 0 refills | Status: DC

## 2018-02-24 MED ORDER — ATORVASTATIN CALCIUM 40 MG PO TABS: 40.0000 mg | ORAL_TABLET | Freq: Every day | ORAL | 1 refills | Status: DC

## 2018-02-24 MED ORDER — GABAPENTIN 300 MG PO CAPS: 300.0000 mg | ORAL_CAPSULE | Freq: Three times a day (TID) | ORAL | 1 refills | Status: DC

## 2018-02-24 MED ORDER — AMLODIPINE BESY-BENAZEPRIL HCL 5-40 MG PO CAPS: 1.0000 | ORAL_CAPSULE | Freq: Every day | ORAL | 1 refills | Status: DC

## 2018-02-24 NOTE — Progress Notes (Signed)
Name: Charlene Hardy   MRN: [id number]    DOB: 07-04-1956   Date:02/24/2018       Progress Note  Subjective  Chief Complaint  Chief Complaint  Patient presents with  . Annual Exam    patient sleeps good throughout the night. patient does not eat right  . Immunizations    Tdap  . Labs Only    patient is fasting     HPI   Patient presents for annual CPE and follow up  DMII: she has been off Metformin and trulicity for months and her last hgbA1C was  5.9%. Last eye exam no diabetic retinopathy 07/2017 . She has peripheral neuropathy - only lack of sensation and fell when she got up from a chair because legs were numb. Severe bilateral foot pain that is currently controlled with Gabapentin. She denies polyphagia,but she has polydipsia ( we will give her a note to have water near her at work - likely hyperparathyroidism ), she also has nocturia . She missed last appointment but is here today for CPE and follow up  Obesity: with multipleco-morbidities,also bilateral knee pain,weight is stable, even off  Trulicity, she is doing better with her diet   Bilateral knee arthralgias: switch from left to right intermittent knee pain. She has been on Meloxicam because of kidney function and is taking Tylenol and Gabapentin, with good control of symptomsPain level right now is zero , while resting. Unchanged   Chronic bronchitis: She is still smoking, she was  using nicotine patch and was  down to 3 cigarettes at day  ( after meals is when she craves it), however has been under more stress - carrying for sister that had complications of back surgery and DVT and has been smoking a pack daily again.  She denies SOB but has a weekly  dry cough, but stable, she states Combivent seems to help with symptoms.  HTN: she is taking medications as prescribed, bp  No chest pain, SOBor palpitation. She also has lower extremity edema, she can't take HCTZ because it causes hypercalcemia but is doing well on  lasix She is also on Metoprolol.   Dyslipidemia: low HDL, on Atorvastatin and otc fish oil . No myalgias, recheck labs today   Hypothyroidism: last TSH was very high on her last visit, she is is back on 150 mcg but one daily and half on Sunday because last level done at Dr. Manfred Shirts was suppressed. We will recheck labs today   Hyperparathyroidism: seeing Endocrinologist,bone density showed mild osteopenia.    Diet: discussed high vitamin D diet Exercise: she is walking daily for 20 minutes   USPSTF grade A and B recommendations    Office Visit from 02/24/2018 in [facility]  AUDIT-C Score  0     Depression:  Depression screen [facility] 2/9 02/24/2018 05/28/2017 01/28/2017 11/02/2016 05/25/2016  Decreased Interest 0 0 0 0 0  Down, Depressed, Hopeless 0 0 0 0 0  PHQ - 2 Score 0 0 0 0 0  Altered sleeping 0 - - - -  Tired, decreased energy 1 - - - -  Change in appetite 0 - - - -  Feeling bad or failure about yourself  0 - - - -  Trouble concentrating 0 - - - -  Moving slowly or fidgety/restless 0 - - - -  Suicidal thoughts 0 - - - -  PHQ-9 Score 1 - - - -   Hypertension: BP Readings from Last 3 Encounters:  02/24/18 132/68  08/24/17 140/78  05/28/17 122/78   Obesity: Wt Readings from Last 3 Encounters:  02/24/18 257 lb 12.8 oz (116.9 kg)  08/24/17 261 lb 11.2 oz (118.7 kg)  05/28/17 268 lb 11.2 oz (121.9 kg)   BMI Readings from Last 3 Encounters:  02/24/18 40.38 kg/m  08/24/17 40.99 kg/m  05/28/17 42.08 kg/m    Hep C Screening: up to date STD testing and prevention (HIV/chl/gon/syphilis): N/A  Intimate partner violence:negative screen Sexual History/Pain during Intercourse: not sexually active for years  Menstrual History/LMP/Abnormal Bleeding: s/p hysterectomy  Incontinence Symptoms: only when she has a cold   Advanced Care Planning: A voluntary discussion about advance care planning including the explanation and discussion of advance directives.   Discussed health care proxy and Living will, and the patient was able to identify a health care proxy as Julieanne Cotton - sister   Patient does have a living will at present time.   Breast cancer:  HM Mammogram  Date Value Ref Range Status  06/07/2014 Normal  Final    BRCA gene screening:  N/A  Cervical cancer screening: hysterectomy   Osteoporosis Screening:  HM Dexa Scan  Date Value Ref Range Status  01/09/2013 Normal  Final    Lipids:  Lab Results  Component Value Date   CHOL 218 (H) 05/28/2017   CHOL 120 11/02/2016   CHOL 120 (L) 02/28/2016   Lab Results  Component Value Date   HDL 45 (L) 05/28/2017   HDL 37 (L) 11/02/2016   HDL 38 (L) 02/28/2016   Lab Results  Component Value Date   LDLCALC 142 (H) 05/28/2017   LDLCALC 57 11/02/2016   LDLCALC 54 02/28/2016   Lab Results  Component Value Date   TRIG 173 (H) 05/28/2017   TRIG 128 11/02/2016   TRIG 139 02/28/2016   Lab Results  Component Value Date   CHOLHDL 4.8 05/28/2017   CHOLHDL 3.2 11/02/2016   CHOLHDL 3.2 02/28/2016   No results found for: LDLDIRECT  Glucose:  Glucose  Date Value Ref Range Status  08/18/2014 109 (H) 65 - 99 mg/dL Final   Glucose, Bld  Date Value Ref Range Status  05/28/2017 81 65 - 99 mg/dL Final    Comment:    .            Fasting reference interval .   11/02/2016 74 65 - 99 mg/dL Final  02/28/2016 80 65 - 99 mg/dL Final   Glucose-Capillary  Date Value Ref Range Status  06/10/2016 85 65 - 99 mg/dL Final    Skin cancer: anticipatory guidance  Colorectal cancer: due for repeat in 2022   Lung cancer:   Low Dose CT Chest recommended if Age 61-80 years, 30 pack-year currently smoking OR have quit w/in 15years. Patient does qualify. She wants to hold off until 62 yo  [id number]   Patient Active Problem List   Diagnosis Date Noted  . Primary hyperparathyroidism ([place]) 02/28/2016  . Arthralgia of both knees 10/23/2015  . Chronic bronchitis ([place]) 10/23/2015  . Osteopenia  10/23/2015  . Left ankle pain 04/25/2015  . Constipation 04/25/2015  . Allergic rhinitis 01/20/2015  . 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/2015  . Diabetic sensorimotor neuropathy ([place]) 01/20/2015  . Dyslipidemia 01/20/2015  . Essential (primary) hypertension 01/20/2015  . H/O iron deficiency anemia 01/20/2015  . Hypertensive retinopathy 01/20/2015  . Adult hypothyroidism 01/20/2015  . Extreme obesity 01/20/2015  . Background retinopathy due to secondary diabetes ([place]) 01/20/2015  . Tinea pedis 01/20/2015  .  Vitamin D deficiency 02/26/2010  . Cigarette smoker 03/06/2008    Past Surgical History:  Procedure Laterality Date  . ABDOMINAL HYSTERECTOMY  08/11/1999   still has ovaries  . COLONOSCOPY  08/11/2015  . COLONOSCOPY WITH PROPOFOL N/A 06/10/2016   Procedure: COLONOSCOPY WITH PROPOFOL;  Surgeon: Christene Lye, MD;  Location: [facility] ENDOSCOPY;  Service: Endoscopy;  Laterality: N/A;    Family History  Problem Relation Age of Onset  . Multiple sclerosis Mother   . Diabetes Father   . Breast cancer Sister 15  . Other Sister        DDD - she had to have back surgery  . Hypertension Maternal Grandmother   . Thyroid disease Paternal Uncle   . Breast cancer Paternal Aunt     Social History   Socioeconomic History  . Marital status: Single    Spouse name: Not on file  . Number of children: 0  . Years of education: Not on file  . Highest education level: High school graduate  Occupational History  . Not on file  Social Needs  . Financial resource strain: Not very hard  . Food insecurity:    Worry: Never true    Inability: Never true  . Transportation needs:    Medical: No    Non-medical: Not on file  Tobacco Use  . Smoking status: Current Every Day Smoker    Packs/day: 1.00    Years: 40.00    Pack years: 40.00    Types: Cigarettes    Start date: 08/10/1976  . Smokeless tobacco: Never Used  . Tobacco comment: states carrying for her sister and wants to hold off for  now   Substance and Sexual Activity  . Alcohol use: No    Alcohol/week: 0.0 oz  . Drug use: No  . Sexual activity: Not Currently  Lifestyle  . Physical activity:    Days per week: 3 days    Minutes per session: 20 min  . Stress: Not at all  Relationships  . Social connections:    Talks on phone: More than three times a week    Gets together: More than three times a week    Attends religious service: More than 4 times per year    Active member of club or organization: No    Attends meetings of clubs or organizations: Never    Relationship status: Never married  . Intimate partner violence:    Fear of current or ex partner: No    Emotionally abused: No    Physically abused: No    Forced sexual activity: No  Other Topics Concern  . Not on file  Social History Narrative  . Not on file     Current Outpatient Medications:  .  acetaminophen (TYLENOL) 500 MG tablet, Take 1 tablet (500 mg total) by mouth 2 (two) times daily., Disp: 30 tablet, Rfl: 0 .  amLODipine-benazepril (LOTREL) 5-40 MG capsule, Take 1 capsule by mouth daily., Disp: 90 capsule, Rfl: 1 .  aspirin 81 MG tablet, Take 1 tablet by mouth daily., Disp: , Rfl:  .  atorvastatin (LIPITOR) 40 MG tablet, Take 1 tablet (40 mg total) by mouth daily., Disp: 90 tablet, Rfl: 1 .  Cholecalciferol (VITAMIN D) 2000 UNITS tablet, Take 1 tablet by mouth daily., Disp: , Rfl:  .  ferrous sulfate 325 (65 FE) MG tablet, Take 1 tablet by mouth daily., Disp: , Rfl:  .  furosemide (LASIX) 20 MG tablet, Take 1 tablet (20 mg total)  by mouth daily., Disp: 90 tablet, Rfl: 1 .  gabapentin (NEURONTIN) 300 MG capsule, Take 1 capsule (300 mg total) by mouth 3 (three) times daily. Gradually go up , to a goal of 300 mg three times daily, Disp: 270 capsule, Rfl: 1 .  Ipratropium-Albuterol (COMBIVENT RESPIMAT) 20-100 MCG/ACT AERS respimat, Inhale 1 puff into the lungs every 6 (six) hours as needed for wheezing., Disp: 1 Inhaler, Rfl: 2 .  levothyroxine  (SYNTHROID, LEVOTHROID) 150 MCG tablet, Take 1 tablet (150 mcg total) by mouth daily. And half on Sunday, Disp: 90 tablet, Rfl: 0 .  metoprolol (TOPROL-XL) 200 MG 24 hr tablet, Take 1 tablet (200 mg total) by mouth daily., Disp: 90 tablet, Rfl: 1 .  MULTIPLE VITAMINS-MINERALS ER PO, Take 1 tablet by mouth daily., Disp: , Rfl:  .  naftifine (NAFTIN) 1 % cream, Apply topically daily., Disp: 90 g, Rfl: 1 .  omega-3 fish oil (MAXEPA) 1000 MG CAPS capsule, Take 1 capsule by mouth daily., Disp: , Rfl:  .  Polyethylene Glycol 3350 POWD, Take 1 Dose by mouth as needed., Disp: , Rfl:  .  potassium chloride SA (K-DUR,KLOR-CON) 20 MEQ tablet, TAKE 1 TABLET BY MOUTH ONCE DAILY, Disp: 90 tablet, Rfl: 0  Allergies  Allergen Reactions  . Hydrochlorothiazide     hyperparathyroidism  . Pneumococcal Vaccine Rash  . Pneumovax [Pneumococcal Polysaccharide Vaccine]      ROS  Constitutional: Negative for fever or weight change.  Respiratory: Positive for cough but not daily at this time, no  shortness of breath.   Cardiovascular: Negative for chest pain or palpitations.  Gastrointestinal: Negative for abdominal pain, no bowel changes.  Musculoskeletal: Negative for gait problem or joint swelling.  Skin: Negative for rash.  Neurological: Negative for dizziness or headache.  No other specific complaints in a complete review of systems (except as listed in HPI above).  Objective  Vitals:   02/24/18 1054  BP: 132/68  Pulse: 76  Resp: 14  Temp: 98.1 F (36.7 C)  TempSrc: Oral  SpO2: 95%  Weight: 257 lb 12.8 oz (116.9 kg)  Height: '5\' 7"'  (1.702 m)    Body mass index is 40.38 kg/m.  Physical Exam   Constitutional: Patient appears well-developed and obese . No distress.  HENT: Head: Normocephalic and atraumatic. Ears: B TMs ok, no erythema or effusion; Nose: Nose normal. Mouth/Throat: Oropharynx is clear and moist. No oropharyngeal exudate.  Eyes: Conjunctivae and EOM are normal. Pupils are  equal, round, and reactive to light. No scleral icterus.  Neck: Normal range of motion. Neck supple. No JVD present. No thyromegaly present.  Cardiovascular: Normal rate, regular rhythm and normal heart sounds.  No murmur heard. No BLE edema. Pulmonary/Chest: Effort normal and breath sounds normal. No respiratory distress. Abdominal: Soft. Bowel sounds are normal, no distension. There is no tenderness. no masses Breast: no lumps or masses, no nipple discharge or rashes FEMALE GENITALIA:  External genitalia normal External urethra normal Vaginal vault normal without discharge or lesions No cervix Bimanual exam normal without masses RECTAL: not done Musculoskeletal: Normal range of motion, no joint effusions. No gross deformities Neurological: he is alert and oriented to person, place, and time. No cranial nerve deficit. Coordination, balance, strength, speech and gait are normal.  Skin: Skin is warm and dry. No rash noted. No erythema.  Psychiatric: Patient has a normal mood and affect. behavior is normal. Judgment and thought content normal.  Diabetic Foot Exam: Diabetic Foot Exam - Simple   Simple Foot  Form Diabetic Foot exam was performed with the following findings:  Yes 02/24/2018 12:08 PM  Visual Inspection See comments:  Yes Sensation Testing See comments:  Yes Pulse Check Posterior Tibialis and Dorsalis pulse intact bilaterally:  Yes Comments Failed monofilament - unable to locate sensation Thick toenails.  Dry skin       PHQ2/9: Depression screen [facility] 2/9 02/24/2018 05/28/2017 01/28/2017 11/02/2016 05/25/2016  Decreased Interest 0 0 0 0 0  Down, Depressed, Hopeless 0 0 0 0 0  PHQ - 2 Score 0 0 0 0 0  Altered sleeping 0 - - - -  Tired, decreased energy 1 - - - -  Change in appetite 0 - - - -  Feeling bad or failure about yourself  0 - - - -  Trouble concentrating 0 - - - -  Moving slowly or fidgety/restless 0 - - - -  Suicidal thoughts 0 - - - -  PHQ-9 Score 1 - - -  -    Fall Risk: Fall Risk  02/24/2018 08/24/2017 05/28/2017 01/28/2017 11/02/2016  Falls in the past year? Yes No No No No  Comment January 18, 2018 - - - -  Number falls in past yr: 1 - - - -  Injury with Fall? No - - - -    Functional Status Survey: Is the patient deaf or have difficulty hearing?: No Does the patient have difficulty seeing, even when wearing glasses/contacts?: No Does the patient have difficulty concentrating, remembering, or making decisions?: No Does the patient have difficulty walking or climbing stairs?: No Does the patient have difficulty dressing or bathing?: No Does the patient have difficulty doing errands alone such as visiting a doctor's office or shopping?: No   Assessment & Plan  1. Well woman exam  Discussed importance of 150 minutes of physical activity weekly, eat two servings of fish weekly, eat one serving of tree nuts ( cashews, pistachios, pecans, almonds.Marland Kitchen) every other day, eat 6 servings of fruit/vegetables daily and drink plenty of water and avoid sweet beverages.  Reviewed bone density done by Dr. Manfred Shirts mild osteopenia Colonoscopy is up to date Cervical cancer up to date Mammogram is up to date   2. Simple chronic bronchitis (HCC)  Continue combivent, needs to quit smoking   3. Morbid obesity ([place])  Discussed with the patient the risk posed by an increased BMI. Discussed importance of portion control, calorie counting and at least 150 minutes of physical activity weekly. Avoid sweet beverages and drink more water. Eat at least 6 servings of fruit and vegetables daily   4. Acquired hypothyroidism  - TSH - levothyroxine (SYNTHROID, LEVOTHROID) 150 MCG tablet; Take 1 tablet (150 mcg total) by mouth daily. And half on Sunday  Dispense: 90 tablet; Refill: 0  5. Type 2 diabetes, controlled, with neuropathy (HCC)  - Lipid panel - Hemoglobin A1c - Urine Microalbumin w/creat. ratio - gabapentin (NEURONTIN) 300 MG capsule; Take 1 capsule  (300 mg total) by mouth 3 (three) times daily. Gradually go up , to a goal of 300 mg three times daily  Dispense: 270 capsule; Refill: 1  6. Primary hyperparathyroidism ([place])  Seen by endocrinologist   7. Leukocytosis, unspecified type  - CBC with Differential/Platelet   8. Dyslipidemia associated with type 2 diabetes mellitus (HCC)  - Lipid panel - Hemoglobin A1c - Urine Microalbumin w/creat. ratio - atorvastatin (LIPITOR) 40 MG tablet; Take 1 tablet (40 mg total) by mouth daily.  Dispense: 90 tablet; Refill: 1  9. Essential (  primary) hypertension  - COMPLETE METABOLIC PANEL WITH GFR - amLODipine-benazepril (LOTREL) 5-40 MG capsule; Take 1 capsule by mouth daily.  Dispense: 90 capsule; Refill: 1   10. EKG abnormality  - Ambulatory referral to Cardiology

## 2018-02-25 LAB — COMPLETE METABOLIC PANEL WITH GFR
AG Ratio: 1.6 (calc) (ref 1.0–2.5)
ALKALINE PHOSPHATASE (APISO): 110 U/L (ref 33–130)
ALT: 9 U/L (ref 6–29)
AST: 11 U/L (ref 10–35)
Albumin: 4.1 g/dL (ref 3.6–5.1)
BUN: 10 mg/dL (ref 7–25)
CO2: 26 mmol/L (ref 20–32)
CREATININE: 0.72 mg/dL (ref 0.50–0.99)
Calcium: 10.5 mg/dL — ABNORMAL HIGH (ref 8.6–10.4)
Chloride: 107 mmol/L (ref 98–110)
GFR, EST AFRICAN AMERICAN: 104 mL/min/{1.73_m2} (ref >=60)
GFR, Est Non African American: 90 mL/min/{1.73_m2} (ref >=60)
GLOBULIN: 2.6 g/dL (ref 1.9–3.7)
Glucose, Bld: 84 mg/dL (ref 65–99)
Potassium: 4.3 mmol/L (ref 3.5–5.3)
SODIUM: 141 mmol/L (ref 135–146)
TOTAL PROTEIN: 6.7 g/dL (ref 6.1–8.1)
Total Bilirubin: 0.5 mg/dL (ref 0.2–1.2)

## 2018-02-25 LAB — CBC WITH DIFFERENTIAL/PLATELET
BASOS ABS: 50 {cells}/uL (ref 0–200)
Basophils Relative: 0.6 %
EOS ABS: 311 {cells}/uL (ref 15–500)
EOS PCT: 3.7 %
HEMATOCRIT: 40.9 % (ref 35.0–45.0)
HEMOGLOBIN: 13.4 g/dL (ref 11.7–15.5)
Lymphs Abs: 2360 cells/uL (ref 850–3900)
MCH: 26.7 pg — ABNORMAL LOW (ref 27.0–33.0)
MCHC: 32.8 g/dL (ref 32.0–36.0)
MCV: 81.6 fL (ref 80.0–100.0)
MPV: 11.5 fL (ref 7.5–12.5)
Monocytes Relative: 6.9 %
NEUTROS ABS: 5099 {cells}/uL (ref 1500–7800)
NEUTROS PCT: 60.7 %
Platelets: 225 10*3/uL (ref 140–400)
RBC: 5.01 10*6/uL (ref 3.80–5.10)
RDW: 13.6 % (ref 11.0–15.0)
Total Lymphocyte: 28.1 %
WBC: 8.4 10*3/uL (ref 3.8–10.8)
WBCMIX: 580 {cells}/uL (ref 200–950)

## 2018-02-25 LAB — MICROALBUMIN / CREATININE URINE RATIO
CREATININE, URINE: 35 mg/dL (ref 20–275)
Microalb, Ur: <0.2 mg/dL

## 2018-02-25 LAB — LIPID PANEL
CHOL/HDL RATIO: 3.2 (calc) (ref <5.0)
CHOLESTEROL: 125 mg/dL (ref <200)
HDL: 39 mg/dL — AB (ref >50)
LDL Cholesterol (Calc): 65 mg/dL (calc)
Non-HDL Cholesterol (Calc): 86 mg/dL (calc) (ref <130)
Triglycerides: 125 mg/dL (ref <150)

## 2018-02-25 LAB — HEMOGLOBIN A1C
Hgb A1c MFr Bld: 5.5 % of total Hgb (ref <5.7)
Mean Plasma Glucose: 111 (calc)
eAG (mmol/L): 6.2 (calc)

## 2018-02-25 LAB — TSH: TSH: 0.29 m[IU]/L — AB (ref 0.40–4.50)

## 2018-03-02 ENCOUNTER — Telehealth: Payer: Self-pay | Admitting: Family Medicine

## 2018-03-02 NOTE — Telephone Encounter (Signed)
Copied from [place] [id number]. Topic: Quick Communication - Lab Results >> Feb 28, 2018  4:42 PM Vonna Kotyk, [place] wrote: Called patient to inform them of most lab results. When patient returns call, triage nurse may disclose results.  patient called and states she missed a call about results. She states you can leave a detailed message on her voicemail if she doesn't answer CB# [phone]

## 2018-03-02 NOTE — Telephone Encounter (Signed)
See result note.  

## 2018-03-04 ENCOUNTER — Other Ambulatory Visit: Payer: Self-pay | Admitting: Family Medicine

## 2018-03-04 MED ORDER — LEVOTHYROXINE SODIUM 137 MCG PO TABS
137.0000 ug | ORAL_TABLET | Freq: Every day | ORAL | 0 refills | Status: DC
Start: 2018-03-04 — End: 2018-06-01

## 2018-04-21 ENCOUNTER — Encounter: Payer: Self-pay | Admitting: Cardiovascular Disease

## 2018-04-21 ENCOUNTER — Ambulatory Visit: Payer: BLUE CROSS/BLUE SHIELD | Admitting: Cardiovascular Disease

## 2018-04-21 VITALS — BP 120/70 | HR 69 | Ht 67.0 in | Wt 266.8 lb

## 2018-04-21 DIAGNOSIS — I10 Essential (primary) hypertension: Secondary | ICD-10-CM

## 2018-04-21 DIAGNOSIS — R0602 Shortness of breath: Secondary | ICD-10-CM | POA: Diagnosis not present

## 2018-04-21 DIAGNOSIS — E785 Hyperlipidemia, unspecified: Secondary | ICD-10-CM

## 2018-04-21 DIAGNOSIS — R0989 Other specified symptoms and signs involving the circulatory and respiratory systems: Secondary | ICD-10-CM | POA: Diagnosis not present

## 2018-04-21 DIAGNOSIS — R9431 Abnormal electrocardiogram [ECG] [EKG]: Secondary | ICD-10-CM

## 2018-04-21 DIAGNOSIS — Z720 Tobacco use: Secondary | ICD-10-CM

## 2018-04-21 NOTE — Progress Notes (Signed)
Cardiology Office Note   Date:  04/21/2018   ID:  Charlene Hardy, DOB [birth year], MRN [id number]  PCP:  Steele Sizer, MD  Cardiologist:   Kathlyn Sacramento, MD   Chief Complaint  Patient presents with  . other    Ref by Dr. Ancil Boozer for abnormal EKG. Meds reviewed by the pt. verbally. Pt. c/o left arm pain; pain starts in left shoulder down to her elbow, pain in right calf, and breaks out into sweats.       History of Present Illness: Charlene Hardy is a 62 y.o. female who presents for was referred by Dr. Ancil Boozer for evaluation of an abnormal EKG.  She has no prior cardiac history but she reports prolonged history of a cardiac murmur.  She has multiple chronic medical conditions that include type 2 diabetes currently not requiring medications, tobacco use, hypertension, hyperlipidemia and morbid obesity. She had a routine physical recently.  Her EKG was suggestive of prior septal infarct.  She denies any chest pain.  She reports exertional dyspnea with moderate activities especially going uphill.  She avoids going upstairs.  No orthopnea, PND or leg edema.  She is not aware of history of sleep apnea.  She smokes 1 pack/day.  Family history is remarkable for hypertension and cancer but no coronary artery disease.    Past Medical History:  Diagnosis Date  . Arthritis   . Diabetes mellitus without complication ([place])   . Hyperlipidemia   . Hypertension   . Thyroid disease     Past Surgical History:  Procedure Laterality Date  . ABDOMINAL HYSTERECTOMY  08/11/1999   still has ovaries  . COLONOSCOPY  08/11/2015  . COLONOSCOPY WITH PROPOFOL N/A 06/10/2016   Procedure: COLONOSCOPY WITH PROPOFOL;  Surgeon: Christene Lye, MD;  Location: [facility] ENDOSCOPY;  Service: Endoscopy;  Laterality: N/A;     Current Outpatient Medications  Medication Sig Dispense Refill  . acetaminophen (TYLENOL) 500 MG tablet Take 1 tablet (500 mg total) by mouth 2 (two) times daily. 30 tablet 0  .  amLODipine-benazepril (LOTREL) 5-40 MG capsule Take 1 capsule by mouth daily. 90 capsule 1  . aspirin 81 MG tablet Take 1 tablet by mouth daily.    Marland Kitchen atorvastatin (LIPITOR) 40 MG tablet Take 1 tablet (40 mg total) by mouth daily. 90 tablet 1  . Cholecalciferol (VITAMIN D) 2000 UNITS tablet Take 1 tablet by mouth daily.    . ferrous sulfate 325 (65 FE) MG tablet Take 1 tablet by mouth daily.    . furosemide (LASIX) 20 MG tablet Take 1 tablet (20 mg total) by mouth daily. 90 tablet 1  . gabapentin (NEURONTIN) 300 MG capsule Take 1 capsule (300 mg total) by mouth 3 (three) times daily. Gradually go up , to a goal of 300 mg three times daily 270 capsule 1  . Ipratropium-Albuterol (COMBIVENT RESPIMAT) 20-100 MCG/ACT AERS respimat Inhale 1 puff into the lungs every 6 (six) hours as needed for wheezing. 1 Inhaler 2  . levothyroxine (SYNTHROID, LEVOTHROID) 137 MCG tablet Take 1 tablet (137 mcg total) by mouth daily before breakfast. 90 tablet 0  . metoprolol (TOPROL-XL) 200 MG 24 hr tablet Take 1 tablet (200 mg total) by mouth daily. 90 tablet 1  . MULTIPLE VITAMINS-MINERALS ER PO Take 1 tablet by mouth daily.    . naftifine (NAFTIN) 1 % cream Apply topically daily. 90 g 1  . omega-3 fish oil (MAXEPA) 1000 MG CAPS capsule Take 1 capsule by mouth daily.    Marland Kitchen  Polyethylene Glycol 3350 POWD Take 1 Dose by mouth as needed.    . potassium chloride SA (K-DUR,KLOR-CON) 20 MEQ tablet TAKE 1 TABLET BY MOUTH ONCE DAILY 90 tablet 0   No current facility-administered medications for this visit.     Allergies:   Hydrochlorothiazide; Pneumococcal vaccine; and Pneumovax [pneumococcal polysaccharide vaccine]    Social History:  The patient  reports that she has been smoking cigarettes. She started smoking about 41 years ago. She has a 40.00 pack-year smoking history. She has never used smokeless tobacco. She reports that she does not drink alcohol or use drugs.   Family History:  The patient's family history includes  Breast cancer in her paternal aunt; Breast cancer (age of onset: 45) in her sister; Diabetes in her father; Hypertension in her maternal grandmother; Multiple sclerosis in her mother; Other in her sister; Thyroid disease in her paternal uncle.    ROS:  Please see the history of present illness.   Otherwise, review of systems are positive for none.   All other systems are reviewed and negative.    PHYSICAL EXAM: VS:  BP 120/70 (BP Location: Right Arm, Patient Position: Sitting, Cuff Size: Large)   Pulse 69   Ht 5\' 7"  (1.702 m)   Wt 266 lb 12 oz (121 kg)   BMI 41.78 kg/m  , BMI Body mass index is 41.78 kg/m. GEN: Well nourished, well developed, in no acute distress  HEENT: normal  Neck: no JVD,  or masses.  Left carotid bruit Cardiac: RRR; no  rubs, or gallops,no edema .  1 out of 6 systolic ejection murmur in the aortic area Respiratory:  clear to auscultation bilaterally, normal work of breathing GI: soft, nontender, nondistended, + BS MS: no deformity or atrophy  Skin: warm and dry, no rash Neuro:  Strength and sensation are intact Psych: euthymic mood, full affect   EKG:  EKG is ordered today. The ekg ordered today demonstrates sinus rhythm with few PACs.  No evidence of prior infarct.   Recent Labs: 02/24/2018: ALT 9; BUN 10; Creat 0.72; Hemoglobin 13.4; Platelets 225; Potassium 4.3; Sodium 141; TSH 0.29    Lipid Panel    Component Value Date/Time   CHOL 125 02/24/2018 1148   CHOL 150 04/25/2015 0857   TRIG 125 02/24/2018 1148   HDL 39 (L) 02/24/2018 1148   HDL 41 04/25/2015 0857   CHOLHDL 3.2 02/24/2018 1148   VLDL 26 11/02/2016 1013   LDLCALC 65 02/24/2018 1148      Wt Readings from Last 3 Encounters:  04/21/18 266 lb 12 oz (121 kg)  02/24/18 257 lb 12.8 oz (116.9 kg)  08/24/17 261 lb 11.2 oz (118.7 kg)       PAD Screen 04/21/2018  Previous PAD dx? No  Previous surgical procedure? No  Pain with walking? Yes  Subsides with rest? Yes  Feet/toe relief with  dangling? No  Painful, non-healing ulcers? No  Extremities discolored? No      ASSESSMENT AND PLAN:  1.  Exertional dyspnea: I do not think her EKG is abnormal.  She does have small R waves in V2 and thus the EKG is not consistent with prior septal infarct.  Nonetheless, given significant exertional dyspnea, I requested an echocardiogram.  No need for a stress test.  I recommend continuing aggressive treatment of risk factors.  2.  Left carotid bruit: I requested carotid Doppler.  3.  Essential hypertension: Blood pressures controlled on current medications.  4.  Hyperlipidemia: Currently on  atorvastatin 40 mg daily.  5.  Tobacco use: I discussed with her the importance of smoking cessation.    Disposition:   FU with me as needed  Signed,  Kathlyn Sacramento, MD  04/21/2018 3:03 PM    [facility]

## 2018-04-21 NOTE — Patient Instructions (Signed)
Medication Instructions:  Your physician recommends that you continue on your current medications as directed. Please refer to the Current Medication list given to you today.   Labwork: none  Testing/Procedures: Your physician has requested that you have an echocardiogram. Echocardiography is a painless test that uses sound waves to create images of your heart. It provides your doctor with information about the size and shape of your heart and how well your heart's chambers and valves are working. This procedure takes approximately one hour. There are no restrictions for this procedure. You may get an IV, if needed, to receive an ultrasound enhancing agent through to better visualize your heart.    Your physician has requested that you have a carotid duplex. This test is an ultrasound of the carotid arteries in your neck. It looks at blood flow through these arteries that supply the brain with blood. Allow one hour for this exam. There are no restrictions or special instructions.     Follow-Up: Your physician recommends that you schedule a follow-up appointment in: AS NEEDED.    Echocardiogram An echocardiogram, or echocardiography, uses sound waves (ultrasound) to produce an image of your heart. The echocardiogram is simple, painless, obtained within a short period of time, and offers valuable information to your health care provider. The images from an echocardiogram can provide information such as:  Evidence of coronary artery disease (CAD).  Heart size.  Heart muscle function.  Heart valve function.  Aneurysm detection.  Evidence of a past heart attack.  Fluid buildup around the heart.  Heart muscle thickening.  Assess heart valve function.  Tell a health care provider about:  Any allergies you have.  All medicines you are taking, including vitamins, herbs, eye drops, creams, and over-the-counter medicines.  Any problems you or family members have had with  anesthetic medicines.  Any blood disorders you have.  Any surgeries you have had.  Any medical conditions you have.  Whether you are pregnant or may be pregnant. What happens before the procedure? No special preparation is needed. Eat and drink normally. What happens during the procedure?  In order to produce an image of your heart, gel will be applied to your chest and a wand-like tool (transducer) will be moved over your chest. The gel will help transmit the sound waves from the transducer. The sound waves will harmlessly bounce off your heart to allow the heart images to be captured in real-time motion. These images will then be recorded.  You may need an IV to receive a medicine that improves the quality of the pictures. What happens after the procedure? You may return to your normal schedule including diet, activities, and medicines, unless your health care provider tells you otherwise. This information is not intended to replace advice given to you by your health care provider. Make sure you discuss any questions you have with your health care provider. Document Released: 07/24/2000 Document Revised: 03/14/2016 Document Reviewed: 04/03/2013 Elsevier Interactive Patient Education  2017 [employer].

## 2018-04-25 ENCOUNTER — Telehealth: Payer: Self-pay

## 2018-04-28 ENCOUNTER — Other Ambulatory Visit: Payer: Self-pay | Admitting: Cardiovascular Disease

## 2018-04-28 ENCOUNTER — Other Ambulatory Visit: Payer: BLUE CROSS/BLUE SHIELD

## 2018-04-28 DIAGNOSIS — R0602 Shortness of breath: Secondary | ICD-10-CM

## 2018-04-28 DIAGNOSIS — M79602 Pain in left arm: Secondary | ICD-10-CM

## 2018-04-28 DIAGNOSIS — Z8679 Personal history of other diseases of the circulatory system: Secondary | ICD-10-CM

## 2018-04-29 ENCOUNTER — Ambulatory Visit (INDEPENDENT_AMBULATORY_CARE_PROVIDER_SITE_OTHER): Payer: BLUE CROSS/BLUE SHIELD

## 2018-04-29 ENCOUNTER — Other Ambulatory Visit: Payer: Self-pay

## 2018-04-29 DIAGNOSIS — R0602 Shortness of breath: Secondary | ICD-10-CM | POA: Diagnosis not present

## 2018-04-29 DIAGNOSIS — R0989 Other specified symptoms and signs involving the circulatory and respiratory systems: Secondary | ICD-10-CM | POA: Diagnosis not present

## 2018-04-29 DIAGNOSIS — Z8679 Personal history of other diseases of the circulatory system: Secondary | ICD-10-CM

## 2018-04-29 DIAGNOSIS — M79602 Pain in left arm: Secondary | ICD-10-CM | POA: Diagnosis not present

## 2018-05-03 ENCOUNTER — Telehealth: Payer: Self-pay | Admitting: *Deleted

## 2018-05-03 DIAGNOSIS — R0989 Other specified symptoms and signs involving the circulatory and respiratory systems: Secondary | ICD-10-CM

## 2018-05-03 NOTE — Telephone Encounter (Signed)
-----   Message from Wellington Hampshire, MD sent at 05/02/2018  4:56 PM EDT ----- Inform patient that carotid Doppler showed moderate left carotid stenosis.  Not significant enough to require surgery but she should continue take low-dose aspirin and atorvastatin.  Repeat study in 1 year.

## 2018-05-03 NOTE — Telephone Encounter (Signed)
Patient made aware of results and verbalized understanding.  Repeat carotid orders have been placed.

## 2018-05-03 NOTE — Telephone Encounter (Signed)
Left a message for the patient to call back.  Notes recorded by Wellington Hampshire, MD on 05/02/2018 at 4:46 PM EDT Inform patient that echo was fine. Normal EF with no evidence of prior myocardial infarction. Minor abnormalities that are not serious.  Notes recorded by Wellington Hampshire, MD on 05/02/2018 at 4:56 PM EDT Inform patient that carotid Doppler showed moderate left carotid stenosis. Not significant enough to require surgery but she should continue take low-dose aspirin and atorvastatin. Repeat study in 1 year.

## 2018-05-03 NOTE — Telephone Encounter (Signed)
Patient calling to discuss recent  Echo testing results   Please call   

## 2018-05-11 NOTE — Telephone Encounter (Signed)
error 

## 2018-05-30 ENCOUNTER — Ambulatory Visit: Payer: BLUE CROSS/BLUE SHIELD | Admitting: Family Medicine

## 2018-05-31 ENCOUNTER — Encounter: Payer: Self-pay | Admitting: Family Medicine

## 2018-05-31 ENCOUNTER — Ambulatory Visit: Payer: BLUE CROSS/BLUE SHIELD | Admitting: Family Medicine

## 2018-05-31 VITALS — BP 112/78 | HR 88 | Temp 97.4°F | Resp 16 | Ht 67.0 in | Wt 260.6 lb

## 2018-05-31 DIAGNOSIS — E213 Hyperparathyroidism, unspecified: Secondary | ICD-10-CM | POA: Diagnosis not present

## 2018-05-31 DIAGNOSIS — E1140 Type 2 diabetes mellitus with diabetic neuropathy, unspecified: Secondary | ICD-10-CM

## 2018-05-31 DIAGNOSIS — R600 Localized edema: Secondary | ICD-10-CM

## 2018-05-31 DIAGNOSIS — Z23 Encounter for immunization: Secondary | ICD-10-CM | POA: Diagnosis not present

## 2018-05-31 DIAGNOSIS — E039 Hypothyroidism, unspecified: Secondary | ICD-10-CM | POA: Diagnosis not present

## 2018-05-31 DIAGNOSIS — I6529 Occlusion and stenosis of unspecified carotid artery: Secondary | ICD-10-CM | POA: Insufficient documentation

## 2018-05-31 DIAGNOSIS — R252 Cramp and spasm: Secondary | ICD-10-CM

## 2018-05-31 DIAGNOSIS — I517 Cardiomegaly: Secondary | ICD-10-CM | POA: Diagnosis not present

## 2018-05-31 DIAGNOSIS — I6522 Occlusion and stenosis of left carotid artery: Secondary | ICD-10-CM | POA: Insufficient documentation

## 2018-05-31 LAB — POCT GLYCOSYLATED HEMOGLOBIN (HGB A1C): HEMOGLOBIN A1C: 6 % — AB (ref 4.0–5.6)

## 2018-05-31 MED ORDER — POTASSIUM CHLORIDE CRYS ER 20 MEQ PO TBCR: 20.0000 meq | EXTENDED_RELEASE_TABLET | Freq: Every day | ORAL | 1 refills | Status: DC

## 2018-05-31 MED ORDER — FUROSEMIDE 20 MG PO TABS: 20.0000 mg | ORAL_TABLET | Freq: Every day | ORAL | 1 refills | Status: DC

## 2018-05-31 MED ORDER — GABAPENTIN 300 MG PO CAPS: 300.0000 mg | ORAL_CAPSULE | Freq: Four times a day (QID) | ORAL | 1 refills | Status: DC

## 2018-05-31 NOTE — Progress Notes (Signed)
Name: Charlene Hardy   MRN: [id number]    DOB: [birth year]   Date:05/31/2018       Progress Note  Subjective  Chief Complaint  Chief Complaint  Patient presents with  . Medication Refill  . Diabetes    Does not check sugar since Dr. Ancil Boozer took her off Metformin  . Dyslipidemia  . Hypertension    Denies any symptoms  . Hypothyroidism  . Hyperparathyrodism  . Bilateral Knee Pain    Off and on pain    HPI   DMII:she has been off Metformin and trulicity for months and hgbA1C today is 6.0% Last eye exam no diabetic retinopathy12/2018. She has peripheral neuropathy - only lack of sensation and fell when she got up from a chair because legs were numb. Severe bilateral foot pain that is currently Gabapentin 3 times a day, but waking up with leg cramps we will increase to 4 times daily and monitor. She denies polyphagia, but she has polydipsia ( we will give her a note to have water near her at work - likely hyperparathyroidism ), she also has nocturia.  Obesity: with multipleco-morbidities,also bilateral knee pain,weight is stable, even off  Trulicity, she is doing better with her diet, BMI still above 40  Bilateral knee arthralgias: switch from left to right intermittent knee pain. She has been on Meloxicam because of kidney function and is taking Tylenol and Gabapentin, with good control of symptomsPain level right now is zero , while resting. Still the same  Chronic bronchitis: She is still smoking, back up to one pack daily.  She denies SOB but has a weekly dry cough, but stable, she states Combivent seems to help with symptoms. Explained importance of quitting smoking again   HTN: she is taking medications as prescribed, bp No chest pain, SOBor palpitation. She also has lower extremity edema, she can't take HCTZ because it causes hypercalcemia but is doing well on lasix She is also on Metoprolol.   Dyslipidemia: low HDL, on Atorvastatin and otc fish oil . Having muscle  cramps intermittently, it may be from hyperparathyroidism, we will check labs  Hypothyroidism: last TSH was very high on her last visit,she is on 137 mcg daily, because 150 mcg caused TSH suppression, we will recheck level today No diarrhea, she has dry skin, no change in bowel movements  Hyperparathyroidism: seeing Endocrinologist,bone density showed mild osteopenia. She has noticed leg cramps about twice a week in am's. She denies GERD symptoms.   Patient Active Problem List   Diagnosis Date Noted  . Primary hyperparathyroidism ([place]) 02/28/2016  . Arthralgia of both knees 10/23/2015  . Chronic bronchitis ([place]) 10/23/2015  . Osteopenia 10/23/2015  . Left ankle pain 04/25/2015  . Constipation 04/25/2015  . Allergic rhinitis 01/20/2015  . 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/2015  . Diabetic sensorimotor neuropathy ([place]) 01/20/2015  . Dyslipidemia 01/20/2015  . Essential (primary) hypertension 01/20/2015  . H/O iron deficiency anemia 01/20/2015  . Hypertensive retinopathy 01/20/2015  . Adult hypothyroidism 01/20/2015  . Extreme obesity 01/20/2015  . Background retinopathy due to secondary diabetes ([place]) 01/20/2015  . Tinea pedis 01/20/2015  . Vitamin D deficiency 02/26/2010  . Cigarette smoker 03/06/2008    Past Surgical History:  Procedure Laterality Date  . ABDOMINAL HYSTERECTOMY  08/11/1999   still has ovaries  . COLONOSCOPY  08/11/2015  . COLONOSCOPY WITH PROPOFOL N/A 06/10/2016   Procedure: COLONOSCOPY WITH PROPOFOL;  Surgeon: Christene Lye, MD;  Location: [facility] ENDOSCOPY;  Service: Endoscopy;  Laterality: N/A;  Family History  Problem Relation Age of Onset  . Multiple sclerosis Mother   . Diabetes Father   . Breast cancer Sister 30  . Other Sister        DDD - she had to have back surgery  . Hypertension Maternal Grandmother   . Thyroid disease Paternal Uncle   . Breast cancer Paternal Aunt     Social History   Socioeconomic History  . Marital status:  Single    Spouse name: Not on file  . Number of children: 0  . Years of education: Not on file  . Highest education level: High school graduate  Occupational History  . Not on file  Social Needs  . Financial resource strain: Not very hard  . Food insecurity:    Worry: Never true    Inability: Never true  . Transportation needs:    Medical: No    Non-medical: Not on file  Tobacco Use  . Smoking status: Current Every Day Smoker    Packs/day: 1.00    Years: 40.00    Pack years: 40.00    Types: Cigarettes    Start date: 08/10/1976  . Smokeless tobacco: Never Used  . Tobacco comment: states carrying for her sister and wants to hold off for now   Substance and Sexual Activity  . Alcohol use: No    Alcohol/week: 0.0 standard drinks  . Drug use: No  . Sexual activity: Not Currently  Lifestyle  . Physical activity:    Days per week: 3 days    Minutes per session: 20 min  . Stress: Not at all  Relationships  . Social connections:    Talks on phone: More than three times a week    Gets together: More than three times a week    Attends religious service: More than 4 times per year    Active member of club or organization: No    Attends meetings of clubs or organizations: Never    Relationship status: Never married  . Intimate partner violence:    Fear of current or ex partner: No    Emotionally abused: No    Physically abused: No    Forced sexual activity: No  Other Topics Concern  . Not on file  Social History Narrative  . Not on file     Current Outpatient Medications:  .  acetaminophen (TYLENOL) 500 MG tablet, Take 1 tablet (500 mg total) by mouth 2 (two) times daily., Disp: 30 tablet, Rfl: 0 .  amLODipine-benazepril (LOTREL) 5-40 MG capsule, Take 1 capsule by mouth daily., Disp: 90 capsule, Rfl: 1 .  aspirin 81 MG tablet, Take 1 tablet by mouth daily., Disp: , Rfl:  .  atorvastatin (LIPITOR) 40 MG tablet, Take 1 tablet (40 mg total) by mouth daily., Disp: 90 tablet,  Rfl: 1 .  Cholecalciferol (VITAMIN D) 2000 UNITS tablet, Take 1 tablet by mouth daily., Disp: , Rfl:  .  ferrous sulfate 325 (65 FE) MG tablet, Take 1 tablet by mouth daily., Disp: , Rfl:  .  furosemide (LASIX) 20 MG tablet, Take 1 tablet (20 mg total) by mouth daily., Disp: 90 tablet, Rfl: 1 .  gabapentin (NEURONTIN) 300 MG capsule, Take 1 capsule (300 mg total) by mouth 3 (three) times daily. Gradually go up , to a goal of 300 mg three times daily, Disp: 270 capsule, Rfl: 1 .  Ipratropium-Albuterol (COMBIVENT RESPIMAT) 20-100 MCG/ACT AERS respimat, Inhale 1 puff into the lungs every 6 (six) hours as  needed for wheezing., Disp: 1 Inhaler, Rfl: 2 .  levothyroxine (SYNTHROID, LEVOTHROID) 137 MCG tablet, Take 1 tablet (137 mcg total) by mouth daily before breakfast., Disp: 90 tablet, Rfl: 0 .  metoprolol (TOPROL-XL) 200 MG 24 hr tablet, Take 1 tablet (200 mg total) by mouth daily., Disp: 90 tablet, Rfl: 1 .  MULTIPLE VITAMINS-MINERALS ER PO, Take 1 tablet by mouth daily., Disp: , Rfl:  .  naftifine (NAFTIN) 1 % cream, Apply topically daily., Disp: 90 g, Rfl: 1 .  omega-3 fish oil (MAXEPA) 1000 MG CAPS capsule, Take 1 capsule by mouth daily., Disp: , Rfl:  .  Polyethylene Glycol 3350 POWD, Take 1 Dose by mouth as needed., Disp: , Rfl:  .  potassium chloride SA (K-DUR,KLOR-CON) 20 MEQ tablet, TAKE 1 TABLET BY MOUTH ONCE DAILY, Disp: 90 tablet, Rfl: 0  Allergies  Allergen Reactions  . Hydrochlorothiazide     hyperparathyroidism  . Pneumococcal Vaccine Rash  . Pneumovax [Pneumococcal Polysaccharide Vaccine]     I personally reviewed active problem list, medication list, allergies, family history, social history with the patient/caregiver today.   ROS  Constitutional: Negative for fever or weight change.  Respiratory: Negative for cough and shortness of breath.   Cardiovascular: Negative for chest pain or palpitations.  Gastrointestinal: Negative for abdominal pain, no bowel changes.   Musculoskeletal: Negative for gait problem or joint swelling.  Skin: Negative for rash.  Neurological: Negative for dizziness or headache.  No other specific complaints in a complete review of systems (except as listed in HPI above).  Objective  Vitals:   05/31/18 1141  BP: 112/78  Pulse: 88  Resp: 16  Temp: (!) 97.4 F (36.3 C)  TempSrc: Oral  SpO2: 98%  Weight: 260 lb 9.6 oz (118.2 kg)  Height: 5\' 7"  (1.702 m)    Body mass index is 40.82 kg/m.  Physical Exam  Constitutional: Patient appears well-developed and well-nourished. Obese No distress.  HEENT: head atraumatic, normocephalic, pupils equal and reactive to light, neck supple, throat within normal limits Cardiovascular: Normal rate, regular rhythm and normal heart sounds.  No murmur heard. No BLE edema. Pulmonary/Chest: Effort normal and breath sounds normal. No respiratory distress. Abdominal: Soft.  There is no tenderness. Psychiatric: Patient has a normal mood and affect. behavior is normal. Judgment and thought content normal.  Recent Results (from the past 2160 hour(s))  POCT HgB A1C     Status: Abnormal   Collection Time: 05/31/18 11:52 AM  Result Value Ref Range   Hemoglobin A1C 6.0 (A) 4.0 - 5.6 %   HbA1c POC (<> result, manual entry)     HbA1c, POC (prediabetic range)     HbA1c, POC (controlled diabetic range)        PHQ2/9: Depression screen [facility] 2/9 05/31/2018 02/24/2018 05/28/2017 01/28/2017 11/02/2016  Decreased Interest 0 0 0 0 0  Down, Depressed, Hopeless 0 0 0 0 0  PHQ - 2 Score 0 0 0 0 0  Altered sleeping 1 0 - - -  Tired, decreased energy 0 1 - - -  Change in appetite 0 0 - - -  Feeling bad or failure about yourself  0 0 - - -  Trouble concentrating 0 0 - - -  Moving slowly or fidgety/restless 0 0 - - -  Suicidal thoughts 0 0 - - -  PHQ-9 Score 1 1 - - -  Difficult doing work/chores Not difficult at all - - - -     Fall Risk: Fall Risk  05/31/2018 02/24/2018 08/24/2017 05/28/2017  01/28/2017  Falls in the past year? No Yes No No No  Comment - January 18, 2018 - - -  Number falls in past yr: - 1 - - -  Injury with Fall? - No - - -     Functional Status Survey: Is the patient deaf or have difficulty hearing?: No Does the patient have difficulty seeing, even when wearing glasses/contacts?: Yes(Glasses) Does the patient have difficulty concentrating, remembering, or making decisions?: No Does the patient have difficulty walking or climbing stairs?: No Does the patient have difficulty dressing or bathing?: No Does the patient have difficulty doing errands alone such as visiting a doctor's office or shopping?: No    Assessment & Plan   1. Type 2 diabetes, controlled, with neuropathy (HCC)  - POCT HgB A1C - gabapentin (NEURONTIN) 300 MG capsule; Take 1 capsule (300 mg total) by mouth 4 (four) times daily. Gradually go up , to a goal of 300 mg three times daily  Dispense: 360 capsule; Refill: 1  2. Need for immunization against influenza  - Flu Vaccine QUAD 6+ mos PF IM (Fluarix Quad PF)  3. Need for diphtheria-tetanus-pertussis (Tdap) vaccine  - Tdap vaccine greater than or equal to 7yo IM  4. Bilateral edema of lower extremity  - furosemide (LASIX) 20 MG tablet; Take 1 tablet (20 mg total) by mouth daily.  Dispense: 90 tablet; Refill: 1 - potassium chloride SA (K-DUR,KLOR-CON) 20 MEQ tablet; Take 1 tablet (20 mEq total) by mouth daily.  Dispense: 90 tablet; Refill: 1  5. Mild concentric left ventricular hypertrophy (LVH)  Seen by Dr. Fletcher Anon  6. Carotid atherosclerosis, left  She will have it rechecked yearly   7. Acquired hypothyroidism  - TSH Taking one pill daily   8. Morbid obesity ([place])  Discussed with the patient the risk posed by an increased BMI. Discussed importance of portion control, calorie counting and at least 150 minutes of physical activity weekly. Avoid sweet beverages and drink more water. Eat at least 6 servings of fruit and  vegetables daily   9. Hyperparathyroidism ([place])  - COMPLETE METABOLIC PANEL WITH GFR - Parathyroid hormone, intact (no Ca)  10. Leg cramps  - Magnesium - COMPLETE METABOLIC PANEL WITH GFR - Parathyroid hormone, intact (no Ca)

## 2018-06-01 ENCOUNTER — Other Ambulatory Visit: Payer: Self-pay | Admitting: Family Medicine

## 2018-06-01 LAB — COMPLETE METABOLIC PANEL WITH GFR
AG Ratio: 1.3 (calc) (ref 1.0–2.5)
ALKALINE PHOSPHATASE (APISO): 127 U/L (ref 33–130)
ALT: 10 U/L (ref 6–29)
AST: 11 U/L (ref 10–35)
Albumin: 4.1 g/dL (ref 3.6–5.1)
BILIRUBIN TOTAL: 0.5 mg/dL (ref 0.2–1.2)
BUN: 11 mg/dL (ref 7–25)
CHLORIDE: 104 mmol/L (ref 98–110)
CO2: 27 mmol/L (ref 20–32)
Calcium: 10.6 mg/dL — ABNORMAL HIGH (ref 8.6–10.4)
Creat: 0.74 mg/dL (ref 0.50–0.99)
GFR, EST NON AFRICAN AMERICAN: 87 mL/min/{1.73_m2} (ref >=60)
GFR, Est African American: 101 mL/min/{1.73_m2} (ref >=60)
GLOBULIN: 3.1 g/dL (ref 1.9–3.7)
Glucose, Bld: 89 mg/dL (ref 65–99)
POTASSIUM: 4.7 mmol/L (ref 3.5–5.3)
SODIUM: 138 mmol/L (ref 135–146)
Total Protein: 7.2 g/dL (ref 6.1–8.1)

## 2018-06-01 LAB — MAGNESIUM: MAGNESIUM: 1.8 mg/dL (ref 1.5–2.5)

## 2018-06-01 LAB — PARATHYROID HORMONE, INTACT (NO CA): PTH: 106 pg/mL — ABNORMAL HIGH (ref 14–64)

## 2018-06-01 LAB — TSH: TSH: 1.23 mIU/L (ref 0.40–4.50)

## 2018-06-01 MED ORDER — LEVOTHYROXINE SODIUM 137 MCG PO TABS: 137.0000 ug | ORAL_TABLET | Freq: Every day | ORAL | 0 refills | Status: DC

## 2018-06-08 ENCOUNTER — Other Ambulatory Visit: Payer: Self-pay | Admitting: Family Medicine

## 2018-06-08 DIAGNOSIS — Z1231 Encounter for screening mammogram for malignant neoplasm of breast: Secondary | ICD-10-CM

## 2018-07-20 ENCOUNTER — Other Ambulatory Visit: Payer: Self-pay | Admitting: Family Medicine

## 2018-07-20 DIAGNOSIS — I10 Essential (primary) hypertension: Secondary | ICD-10-CM

## 2018-07-26 ENCOUNTER — Ambulatory Visit
Admission: RE | Admit: 2018-07-26 | Discharge: 2018-07-26 | Disposition: A | Discharge status: Home / Self Care | Payer: BLUE CROSS/BLUE SHIELD | Source: Ambulatory Visit | Attending: Family Medicine | Admitting: Family Medicine

## 2018-07-26 DIAGNOSIS — Z1231 Encounter for screening mammogram for malignant neoplasm of breast: Secondary | ICD-10-CM | POA: Diagnosis not present

## 2018-07-27 ENCOUNTER — Other Ambulatory Visit: Payer: Self-pay | Admitting: Family Medicine

## 2018-07-27 DIAGNOSIS — R928 Other abnormal and inconclusive findings on diagnostic imaging of breast: Secondary | ICD-10-CM

## 2018-08-16 ENCOUNTER — Ambulatory Visit
Admission: RE | Admit: 2018-08-16 | Discharge: 2018-08-16 | Disposition: A | Discharge status: Home / Self Care | Payer: BLUE CROSS/BLUE SHIELD | Source: Ambulatory Visit | Attending: Family Medicine | Admitting: Family Medicine

## 2018-08-16 ENCOUNTER — Other Ambulatory Visit: Payer: Self-pay | Admitting: Family Medicine

## 2018-08-16 DIAGNOSIS — R928 Other abnormal and inconclusive findings on diagnostic imaging of breast: Secondary | ICD-10-CM

## 2018-08-16 DIAGNOSIS — R922 Inconclusive mammogram: Secondary | ICD-10-CM | POA: Diagnosis not present

## 2018-08-27 ENCOUNTER — Other Ambulatory Visit: Payer: Self-pay | Admitting: Family Medicine

## 2018-08-27 DIAGNOSIS — E1140 Type 2 diabetes mellitus with diabetic neuropathy, unspecified: Secondary | ICD-10-CM

## 2018-08-31 ENCOUNTER — Ambulatory Visit: Payer: BLUE CROSS/BLUE SHIELD | Admitting: Family Medicine

## 2018-09-05 ENCOUNTER — Ambulatory Visit
Admission: RE | Admit: 2018-09-05 | Discharge: 2018-09-05 | Disposition: A | Discharge status: Home / Self Care | Payer: BLUE CROSS/BLUE SHIELD | Source: Ambulatory Visit | Attending: Family Medicine | Admitting: Family Medicine

## 2018-09-05 DIAGNOSIS — R928 Other abnormal and inconclusive findings on diagnostic imaging of breast: Secondary | ICD-10-CM

## 2018-09-05 DIAGNOSIS — N6081 Other benign mammary dysplasias of right breast: Secondary | ICD-10-CM | POA: Diagnosis not present

## 2018-09-05 HISTORY — PX: BREAST BIOPSY: SHX20

## 2018-09-06 LAB — SURGICAL PATHOLOGY

## 2018-09-13 ENCOUNTER — Ambulatory Visit: Payer: BLUE CROSS/BLUE SHIELD | Admitting: Family Medicine

## 2018-09-13 ENCOUNTER — Encounter: Payer: Self-pay | Admitting: Family Medicine

## 2018-09-13 VITALS — BP 110/80 | HR 90 | Temp 97.7°F | Resp 16 | Ht 67.0 in | Wt 270.2 lb

## 2018-09-13 DIAGNOSIS — I10 Essential (primary) hypertension: Secondary | ICD-10-CM

## 2018-09-13 DIAGNOSIS — E1140 Type 2 diabetes mellitus with diabetic neuropathy, unspecified: Secondary | ICD-10-CM

## 2018-09-13 DIAGNOSIS — J410 Simple chronic bronchitis: Secondary | ICD-10-CM

## 2018-09-13 DIAGNOSIS — E785 Hyperlipidemia, unspecified: Secondary | ICD-10-CM

## 2018-09-13 DIAGNOSIS — E1169 Type 2 diabetes mellitus with other specified complication: Secondary | ICD-10-CM

## 2018-09-13 DIAGNOSIS — B353 Tinea pedis: Secondary | ICD-10-CM

## 2018-09-13 DIAGNOSIS — E213 Hyperparathyroidism, unspecified: Secondary | ICD-10-CM

## 2018-09-13 LAB — POCT GLYCOSYLATED HEMOGLOBIN (HGB A1C): Hemoglobin A1C: 6.1 % — AB (ref 4.0–5.6)

## 2018-09-13 MED ORDER — SEMAGLUTIDE(0.25 OR 0.5MG/DOS) 2 MG/1.5ML ~~LOC~~ SOPN: 0.5000 mg | PEN_INJECTOR | SUBCUTANEOUS | 2 refills | Status: DC

## 2018-09-13 MED ORDER — METOPROLOL SUCCINATE ER 100 MG PO TB24: 100.0000 mg | ORAL_TABLET | Freq: Every day | ORAL | 1 refills | Status: DC

## 2018-09-13 MED ORDER — ATORVASTATIN CALCIUM 40 MG PO TABS: 40.0000 mg | ORAL_TABLET | Freq: Every day | ORAL | 1 refills | Status: DC

## 2018-09-13 MED ORDER — NAFTIFINE HCL 1 % EX CREA: TOPICAL_CREAM | Freq: Every day | CUTANEOUS | 1 refills | Status: DC

## 2018-09-13 MED ORDER — AMLODIPINE BESY-BENAZEPRIL HCL 5-40 MG PO CAPS: 1.0000 | ORAL_CAPSULE | Freq: Every day | ORAL | 1 refills | Status: DC

## 2018-09-13 NOTE — Progress Notes (Signed)
Name: Charlene Hardy   MRN: [id number]    DOB: May 26, 1956   Date:09/13/2018       Progress Note  Subjective  Chief Complaint  Chief Complaint  Patient presents with  . Diabetes  . Hypothyroidism  . Hyperlipidemia    HPI  DMII:she has been off Metforminand trulicity for months last A1C was 6.0% and today it is 6.1% Last eye exam no diabetic retinopathy12/2018, explained importance of going back. She has peripheral neuropathy , doing better only occasionally has numbness on her legs. Gabapentin now is 4 times daily and seems to be helping with leg cramps also  She denies polyphagia, but she has polydipsia ( we will give her a note to have water near her at work - likely hyperparathyroidism ), she also has nocturiathe days she takes lasix.   Obesity: with multipleco-morbidities,also bilateral knee pain,weigh has gone up since she stopped taking Trulicity, 10 lbs over the past 4 months , she was not very compliant with diet over the holidays and she also had two weeks off from work.   Bilateral knee arthralgias: switch from left to right intermittent knee pain. She has been on Meloxicam because of kidney function and is taking Tylenol and Gabapentin, with good control of symptoms. Pain only with activity.   Chronic bronchitis: She is still smoking,now 1.5 pack daily.  She denies SOB, but she has a cough most days, usually dry.  She states Combivent seems to help with symptoms. Explained importance of quitting smoking again , discussed spirometry on her next visit   HTN: she is taking medications as prescribed, bp No chest pain, SOBor palpitation. She used to have lower extremity edema and has been taking lasix, however states taking every other day and the days she takes it she has nocturia. Advised to try to stop it, and use compression stocking instead.   Dyslipidemia: low HDL, on Atorvastatin and otc fish oil .muscle cramps under control   Hypothyroidism: last TSH was  very high on her last visit,she ison 137 mcg daily, she cannot afford having repeat labs today, she states she should be able to do it on her next visit. She has gained weight 10 lbs, but previous TSH was suppressed   Hyperparathyroidism: seeing Endocrinologist - Dr. Honor Junes  ,bone density showed mild osteopenia.She sates cramps improved.  She denies GERD symptoms.  Recent breast biopsy on right was negative   Patient Active Problem List   Diagnosis Date Noted  . Morbid obesity ([place]) 09/13/2018  . Mild concentric left ventricular hypertrophy (LVH) 05/31/2018  . Carotid atherosclerosis, left 05/31/2018  . Primary hyperparathyroidism ([place]) 02/28/2016  . Arthralgia of both knees 10/23/2015  . Chronic bronchitis ([place]) 10/23/2015  . Osteopenia 10/23/2015  . Left ankle pain 04/25/2015  . Constipation 04/25/2015  . Allergic rhinitis 01/20/2015  . 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/2015  . Diabetic sensorimotor neuropathy ([place]) 01/20/2015  . Dyslipidemia 01/20/2015  . Essential (primary) hypertension 01/20/2015  . H/O iron deficiency anemia 01/20/2015  . Hypertensive retinopathy 01/20/2015  . Adult hypothyroidism 01/20/2015  . Extreme obesity 01/20/2015  . Background retinopathy due to secondary diabetes ([place]) 01/20/2015  . Tinea pedis 01/20/2015  . Vitamin D deficiency 02/26/2010  . Cigarette smoker 03/06/2008    Past Surgical History:  Procedure Laterality Date  . ABDOMINAL HYSTERECTOMY  08/11/1999   still has ovaries  . BREAST BIOPSY Right 09/05/2018   Affirm Bx- Ribbon clip- path pending  . COLONOSCOPY  08/11/2015  . COLONOSCOPY WITH PROPOFOL N/A 06/10/2016  Procedure: COLONOSCOPY WITH PROPOFOL;  Surgeon: Christene Lye, MD;  Location: [facility] ENDOSCOPY;  Service: Endoscopy;  Laterality: N/A;    Family History  Problem Relation Age of Onset  . Multiple sclerosis Mother   . Diabetes Father   . Breast cancer Sister 17  . Other Sister        DDD - she had to have back  surgery  . Hypertension Maternal Grandmother   . Thyroid disease Paternal Uncle   . Breast cancer Paternal Aunt     Social History   Socioeconomic History  . Marital status: Single    Spouse name: Not on file  . Number of children: 0  . Years of education: Not on file  . Highest education level: High school graduate  Occupational History  . Not on file  Social Needs  . Financial resource strain: Not very hard  . Food insecurity:    Worry: Never true    Inability: Never true  . Transportation needs:    Medical: No    Non-medical: Not on file  Tobacco Use  . Smoking status: Current Every Day Smoker    Packs/day: 1.50    Years: 41.00    Pack years: 61.50    Types: Cigarettes    Start date: 08/10/1976  . Smokeless tobacco: Never Used  Substance and Sexual Activity  . Alcohol use: No    Alcohol/week: 0.0 standard drinks  . Drug use: No  . Sexual activity: Not Currently  Lifestyle  . Physical activity:    Days per week: 3 days    Minutes per session: 20 min  . Stress: Not at all  Relationships  . Social connections:    Talks on phone: More than three times a week    Gets together: More than three times a week    Attends religious service: More than 4 times per year    Active member of club or organization: No    Attends meetings of clubs or organizations: Never    Relationship status: Never married  . Intimate partner violence:    Fear of current or ex partner: No    Emotionally abused: No    Physically abused: No    Forced sexual activity: No  Other Topics Concern  . Not on file  Social History Narrative  . Not on file     Current Outpatient Medications:  .  acetaminophen (TYLENOL) 500 MG tablet, Take 1 tablet (500 mg total) by mouth 2 (two) times daily., Disp: 30 tablet, Rfl: 0 .  amLODipine-benazepril (LOTREL) 5-40 MG capsule, Take 1 capsule by mouth daily., Disp: 90 capsule, Rfl: 1 .  aspirin 81 MG tablet, Take 1 tablet by mouth daily., Disp: , Rfl:  .   atorvastatin (LIPITOR) 40 MG tablet, Take 1 tablet (40 mg total) by mouth daily., Disp: 90 tablet, Rfl: 1 .  Cholecalciferol (VITAMIN D) 2000 UNITS tablet, Take 1 tablet by mouth daily., Disp: , Rfl:  .  ferrous sulfate 325 (65 FE) MG tablet, Take 1 tablet by mouth daily., Disp: , Rfl:  .  furosemide (LASIX) 20 MG tablet, Take 1 tablet (20 mg total) by mouth daily., Disp: 90 tablet, Rfl: 1 .  gabapentin (NEURONTIN) 300 MG capsule, Take 1 capsule (300 mg total) by mouth 3 (three) times daily., Disp: 270 capsule, Rfl: 0 .  Ipratropium-Albuterol (COMBIVENT RESPIMAT) 20-100 MCG/ACT AERS respimat, Inhale 1 puff into the lungs every 6 (six) hours as needed for wheezing., Disp: 1 Inhaler, Rfl:  2 .  levothyroxine (SYNTHROID, LEVOTHROID) 137 MCG tablet, Take 1 tablet (137 mcg total) by mouth daily before breakfast., Disp: 90 tablet, Rfl: 0 .  metoprolol (TOPROL-XL) 100 MG 24 hr tablet, Take 1 tablet (100 mg total) by mouth daily., Disp: 90 tablet, Rfl: 1 .  MULTIPLE VITAMINS-MINERALS ER PO, Take 1 tablet by mouth daily., Disp: , Rfl:  .  naftifine (NAFTIN) 1 % cream, Apply topically daily., Disp: 90 g, Rfl: 1 .  omega-3 fish oil (MAXEPA) 1000 MG CAPS capsule, Take 1 capsule by mouth daily., Disp: , Rfl:  .  Polyethylene Glycol 3350 POWD, Take 1 Dose by mouth as needed., Disp: , Rfl:  .  potassium chloride SA (K-DUR,KLOR-CON) 20 MEQ tablet, Take 1 tablet (20 mEq total) by mouth daily., Disp: 90 tablet, Rfl: 1  Allergies  Allergen Reactions  . Hydrochlorothiazide     hyperparathyroidism  . Pneumococcal Vaccine Rash  . Pneumovax [Pneumococcal Polysaccharide Vaccine]     I personally reviewed active problem list, medication list, allergies, family history, social history with the patient/caregiver today.   ROS  Constitutional: Negative for fever , positive for weight change - 10lbs since last visit .  Respiratory:Postiive for cough and intermittent  shortness of breath - when has an odor.    Cardiovascular: Negative for chest pain or palpitations.  Gastrointestinal: Negative for abdominal pain, no bowel changes.  Musculoskeletal: Negative for gait problem or joint swelling.  Skin: positive  for rash between toes, better with naftin .  Neurological: Negative for dizziness or headache.  No other specific complaints in a complete review of systems (except as listed in HPI above).  Objective  Vitals:   09/13/18 1014  BP: 110/80  Pulse: 90  Resp: 16  Temp: 97.7 F (36.5 C)  TempSrc: Oral  SpO2: 99%  Weight: 270 lb 3.2 oz (122.6 kg)  Height: 5\' 7"  (1.702 m)    Body mass index is 42.32 kg/m.  Physical Exam  Constitutional: Patient appears well-developed and well-nourished. Obese No distress.  HEENT: head atraumatic, normocephalic, pupils equal and reactive to light, neck supple, throat within normal limits Cardiovascular: Normal rate, regular rhythm and normal heart sounds.  No murmur heard. No BLE edema. Pulmonary/Chest: Effort normal and breath sounds normal. No respiratory distress. Abdominal: Soft.  There is no tenderness. Psychiatric: Patient has a normal mood and affect. behavior is normal. Judgment and thought content normal.  Recent Results (from the past 2160 hour(s))  Surgical pathology     Status: None   Collection Time: 09/05/18  9:02 AM  Result Value Ref Range   SURGICAL PATHOLOGY      Surgical Pathology CASE: [id number] PATIENT: Rameen Dowson Surgical Pathology Report     SPECIMEN SUBMITTED: A. Breast, right, UOQ  CLINICAL HISTORY: Distortion  PRE-OPERATIVE DIAGNOSIS: Distortion  POST-OPERATIVE DIAGNOSIS: None provided.     DIAGNOSIS: A. BREAST, RIGHT UPPER OUTER QUADRANT; STEREOTACTIC CORE BIOPSY: - BENIGN MAMMARY PARENCHYMA WITH MILD STROMAL FIBROSIS. - NO EVIDENCE OF ATYPICAL PROLIFERATIVE BREAST DISEASE.   GROSS DESCRIPTION: A. Labeled: Formalin Received: in a formalin-filled [place] collection device Time/Date in  fixative: 08: 55 a.m. on 09/05/2018 Cold ischemic time: Less than 1 hour Total fixation time: 9 hours Core pieces: Multiple Measurement: Aggregate, 1.5 x 1.0 x 0.3 cm Description / comments: Several fibrofatty cores and its fragments Inked: Blue Entirely submitted in cassette(s): A1-A3     Final Diagnosis performed by Allena Napoleon, MD.   Electronically signed 09/06/2018 11:48:43AM The electronic signature  indicates that  the named Attending Pathologist has evaluated the specimen  Technical component performed at [facility], [address] [place], [place] [address] Lab: [phone] Dir: Rush Farmer, MD, MMM  Professional component performed at [facility], [facility], [place], [place], [place] [address] Lab: [phone] Dir: Dellia Nims. Rubinas, MD   POCT HgB A1C     Status: Abnormal   Collection Time: 09/13/18 10:47 AM  Result Value Ref Range   Hemoglobin A1C 6.1 (A) 4.0 - 5.6 %   HbA1c POC (<> result, manual entry)     HbA1c, POC (prediabetic range)     HbA1c, POC (controlled diabetic range)       PHQ2/9: Depression screen [facility] 2/9 09/13/2018 05/31/2018 02/24/2018 05/28/2017 01/28/2017  Decreased Interest 0 0 0 0 0  Down, Depressed, Hopeless 0 0 0 0 0  PHQ - 2 Score 0 0 0 0 0  Altered sleeping - 1 0 - -  Tired, decreased energy - 0 1 - -  Change in appetite - 0 0 - -  Feeling bad or failure about yourself  - 0 0 - -  Trouble concentrating - 0 0 - -  Moving slowly or fidgety/restless - 0 0 - -  Suicidal thoughts - 0 0 - -  PHQ-9 Score - 1 1 - -  Difficult doing work/chores - Not difficult at all - - -    Fall Risk: Fall Risk  05/31/2018 02/24/2018 08/24/2017 05/28/2017 01/28/2017  Falls in the past year? No Yes No No No  Comment - January 18, 2018 - - -  Number falls in past yr: - 1 - - -  Injury with Fall? - No - - -     Assessment & Plan  1. Type 2 diabetes, controlled, with neuropathy (HCC)  - POCT HgB A1C Resume GLP-1 today  -  Semaglutide,0.25 or 0.5MG /DOS, (OZEMPIC, 0.25 OR 0.5 MG/DOSE,) 2 MG/1.5ML SOPN; Inject 0.5 mg into the skin once a week.  Dispense: 2 pen; Refill: 2  2. Essential (primary) hypertension  - amLODipine-benazepril (LOTREL) 5-40 MG capsule; Take 1 capsule by mouth daily.  Dispense: 90 capsule; Refill: 1 - metoprolol (TOPROL-XL) 100 MG 24 hr tablet; Take 1 tablet (100 mg total) by mouth daily.  Dispense: 90 tablet; Refill: 1  3. Dyslipidemia associated with type 2 diabetes mellitus (HCC)  - atorvastatin (LIPITOR) 40 MG tablet; Take 1 tablet (40 mg total) by mouth daily.  Dispense: 90 tablet; Refill: 1  4. Tinea pedis of both feet  - naftifine (NAFTIN) 1 % cream; Apply topically daily.  Dispense: 90 g; Refill: 1  5. Morbid obesity ([place])  Discussed with the patient the risk posed by an increased BMI. Discussed importance of portion control, calorie counting and at least 150 minutes of physical activity weekly. Avoid sweet beverages and drink more water. Eat at least 6 servings of fruit and vegetables daily   6. Simple chronic bronchitis (HCC)  Stable   7. Hyperparathyroidism ([place])  Keep follow up with Dr. Honor Junes

## 2018-09-27 ENCOUNTER — Other Ambulatory Visit: Payer: Self-pay | Admitting: Family Medicine

## 2018-09-27 NOTE — Telephone Encounter (Signed)
Refill request for thyroid medication. Levothyroxine to [facility].   Last Physical: 02/24/2018   Lab Results  Component Value Date   TSH 1.23 05/31/2018     Follow up on 12/26/2018

## 2018-11-16 DIAGNOSIS — E039 Hypothyroidism, unspecified: Secondary | ICD-10-CM | POA: Diagnosis not present

## 2018-11-16 DIAGNOSIS — E210 Primary hyperparathyroidism: Secondary | ICD-10-CM | POA: Diagnosis not present

## 2018-12-26 ENCOUNTER — Ambulatory Visit: Payer: BLUE CROSS/BLUE SHIELD | Admitting: Family Medicine

## 2018-12-26 ENCOUNTER — Encounter: Payer: Self-pay | Admitting: Family Medicine

## 2018-12-26 ENCOUNTER — Other Ambulatory Visit: Payer: Self-pay

## 2018-12-26 VITALS — BP 122/80 | HR 90 | Temp 98.0°F | Resp 16 | Ht 67.0 in | Wt 266.6 lb

## 2018-12-26 DIAGNOSIS — E039 Hypothyroidism, unspecified: Secondary | ICD-10-CM

## 2018-12-26 DIAGNOSIS — E213 Hyperparathyroidism, unspecified: Secondary | ICD-10-CM

## 2018-12-26 DIAGNOSIS — I10 Essential (primary) hypertension: Secondary | ICD-10-CM

## 2018-12-26 DIAGNOSIS — R600 Localized edema: Secondary | ICD-10-CM

## 2018-12-26 DIAGNOSIS — J410 Simple chronic bronchitis: Secondary | ICD-10-CM

## 2018-12-26 DIAGNOSIS — E785 Hyperlipidemia, unspecified: Secondary | ICD-10-CM

## 2018-12-26 DIAGNOSIS — E1140 Type 2 diabetes mellitus with diabetic neuropathy, unspecified: Secondary | ICD-10-CM

## 2018-12-26 DIAGNOSIS — E1169 Type 2 diabetes mellitus with other specified complication: Secondary | ICD-10-CM | POA: Diagnosis not present

## 2018-12-26 LAB — POCT GLYCOSYLATED HEMOGLOBIN (HGB A1C): Hemoglobin A1C: 5.5 % (ref 4.0–5.6)

## 2018-12-26 MED ORDER — LEVOTHYROXINE SODIUM 137 MCG PO TABS: 137.0000 ug | ORAL_TABLET | Freq: Every day | ORAL | 1 refills | Status: DC

## 2018-12-26 MED ORDER — GABAPENTIN 300 MG PO CAPS: 300.0000 mg | ORAL_CAPSULE | Freq: Four times a day (QID) | ORAL | 1 refills | Status: DC

## 2018-12-26 MED ORDER — POTASSIUM CHLORIDE CRYS ER 20 MEQ PO TBCR: 20.0000 meq | EXTENDED_RELEASE_TABLET | Freq: Every day | ORAL | 1 refills | Status: DC

## 2018-12-26 MED ORDER — SEMAGLUTIDE(0.25 OR 0.5MG/DOS) 2 MG/1.5ML ~~LOC~~ SOPN: 0.5000 mg | PEN_INJECTOR | SUBCUTANEOUS | 2 refills | Status: DC

## 2018-12-26 NOTE — Progress Notes (Signed)
Name: Charlene Hardy   MRN: [id number]    DOB: 08-22-1955   Date:12/26/2018       Progress Note  Subjective  Chief Complaint  Chief Complaint  Patient presents with  . Medication Refill  . Diabetes    Has not checked lately because she needs a new battery  . Hypertension    Denies any symptoms  . Dyslipidemia  . Hypothyroidism    HPI  DMII:she has been off Metforminand was on  Trulicity for months but since 09/2018 she has been on Ozempic and has noticed mild drop of her weight since change.  Last eye exam no diabetic retinopathy12/2018, she needs to go back. She has peripheral neuropathy , doing better only occasionally has numbness on her legs, continue Gabapentin. She denies polyphagia, but she has polydipsia ( we will give her a note to have water near her at work - likely hyperparathyroidism ), she also has nocturiathe days she takes lasix and is stable.   Obesity: with multipleco-morbidities,also bilateral knee pain,weigh has gone up when she stopped taking Trulicity, 10 lbs over the past 4 months, but she is now on Ozempic and has lost 6 lbs since last visit.   Bilateral knee arthralgias: switch from left to right intermittent knee pain, currently pain on right knee and left foot. . She is off Meloxicam because of kidney function and is taking Tylenol and Gabapentin, with good control of symptoms. Pain only with activity. She accidentally walked into a music box and it was swollen a couple of weeks ago but is back to her baseline now   Chronic bronchitis: She is still smoking,now 1.5 pack daily. She denies SOB, but she has a cough most days, usually dry.  She states Combivent seems to help with symptoms.Explained importance of quitting smoking again, we will have to skip spirometry secondary to CPVOD-19 at this time  HTN: she is taking medications as prescribed, bp No chest pain, SOBor palpitation. She used to have lower extremity edema and has been taking lasix,  however states taking every other day and the days she takes it she has nocturia. She has not tried compression stocking hoses as recommended on her last visit.   Dyslipidemia: low HDL, on Atorvastatin and otc fish oil , she has mild leg cramps but stable at this time  Hypothyroidism: TSH done last month at [facility] clinic and at goal, continue current dose.   Hyperparathyroidism: seeing Endocrinologist - Dr. Honor Junes ,bone density showed mild osteopenia.She sates cramps improved.  She denies GERD symptoms.Recently seen by him and labs reviewed with patient today    Patient Active Problem List   Diagnosis Date Noted  . Morbid obesity ([place]) 09/13/2018  . Mild concentric left ventricular hypertrophy (LVH) 05/31/2018  . Carotid atherosclerosis, left 05/31/2018  . Primary hyperparathyroidism ([place]) 02/28/2016  . Arthralgia of both knees 10/23/2015  . Chronic bronchitis ([place]) 10/23/2015  . Osteopenia 10/23/2015  . Left ankle pain 04/25/2015  . Constipation 04/25/2015  . Allergic rhinitis 01/20/2015  . 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/2015  . Diabetic sensorimotor neuropathy ([place]) 01/20/2015  . Dyslipidemia 01/20/2015  . Essential (primary) hypertension 01/20/2015  . H/O iron deficiency anemia 01/20/2015  . Hypertensive retinopathy 01/20/2015  . Adult hypothyroidism 01/20/2015  . Extreme obesity 01/20/2015  . Background retinopathy due to secondary diabetes ([place]) 01/20/2015  . Tinea pedis 01/20/2015  . Vitamin D deficiency 02/26/2010  . Cigarette smoker 03/06/2008    Past Surgical History:  Procedure Laterality Date  . ABDOMINAL HYSTERECTOMY  08/11/1999   still has ovaries  . BREAST BIOPSY Right 09/05/2018   Affirm Bx- Ribbon clip- path pending  . COLONOSCOPY  08/11/2015  . COLONOSCOPY WITH PROPOFOL N/A 06/10/2016   Procedure: COLONOSCOPY WITH PROPOFOL;  Surgeon: Christene Lye, MD;  Location: [facility] ENDOSCOPY;  Service: Endoscopy;  Laterality: N/A;    Family History  Problem  Relation Age of Onset  . Multiple sclerosis Mother   . Diabetes Father   . Breast cancer Sister 27  . Other Sister        DDD - she had to have back surgery  . Hypertension Maternal Grandmother   . Thyroid disease Paternal Uncle   . Breast cancer Paternal Aunt     Social History   Socioeconomic History  . Marital status: Single    Spouse name: Not on file  . Number of children: 0  . Years of education: Not on file  . Highest education level: High school graduate  Occupational History  . Not on file  Social Needs  . Financial resource strain: Not very hard  . Food insecurity:    Worry: Never true    Inability: Never true  . Transportation needs:    Medical: No    Non-medical: Not on file  Tobacco Use  . Smoking status: Current Every Day Smoker    Packs/day: 1.50    Years: 41.00    Pack years: 61.50    Types: Cigarettes    Start date: 08/10/1976  . Smokeless tobacco: Never Used  Substance and Sexual Activity  . Alcohol use: No    Alcohol/week: 0.0 standard drinks  . Drug use: No  . Sexual activity: Not Currently  Lifestyle  . Physical activity:    Days per week: 3 days    Minutes per session: 20 min  . Stress: Not at all  Relationships  . Social connections:    Talks on phone: More than three times a week    Gets together: More than three times a week    Attends religious service: More than 4 times per year    Active member of club or organization: No    Attends meetings of clubs or organizations: Never    Relationship status: Never married  . Intimate partner violence:    Fear of current or ex partner: No    Emotionally abused: No    Physically abused: No    Forced sexual activity: No  Other Topics Concern  . Not on file  Social History Narrative  . Not on file     Current Outpatient Medications:  .  acetaminophen (TYLENOL) 500 MG tablet, Take 1 tablet (500 mg total) by mouth 2 (two) times daily., Disp: 30 tablet, Rfl: 0 .  amLODipine-benazepril  (LOTREL) 5-40 MG capsule, Take 1 capsule by mouth daily., Disp: 90 capsule, Rfl: 1 .  aspirin 81 MG tablet, Take 1 tablet by mouth daily., Disp: , Rfl:  .  atorvastatin (LIPITOR) 40 MG tablet, Take 1 tablet (40 mg total) by mouth daily., Disp: 90 tablet, Rfl: 1 .  Cholecalciferol (VITAMIN D) 2000 UNITS tablet, Take 1 tablet by mouth daily., Disp: , Rfl:  .  ferrous sulfate 325 (65 FE) MG tablet, Take 1 tablet by mouth daily., Disp: , Rfl:  .  furosemide (LASIX) 20 MG tablet, Take 1 tablet (20 mg total) by mouth daily., Disp: 90 tablet, Rfl: 1 .  gabapentin (NEURONTIN) 300 MG capsule, Take 1 capsule (300 mg total) by mouth 3 (three)  times daily. (Patient taking differently: Take 300 mg by mouth 4 (four) times daily. ), Disp: 270 capsule, Rfl: 0 .  Ipratropium-Albuterol (COMBIVENT RESPIMAT) 20-100 MCG/ACT AERS respimat, Inhale 1 puff into the lungs every 6 (six) hours as needed for wheezing., Disp: 1 Inhaler, Rfl: 2 .  levothyroxine (SYNTHROID, LEVOTHROID) 137 MCG tablet, TAKE 1 TABLET BY MOUTH ONCE DAILY BEFORE BREAKFAST, Disp: 90 tablet, Rfl: 0 .  metoprolol (TOPROL-XL) 100 MG 24 hr tablet, Take 1 tablet (100 mg total) by mouth daily., Disp: 90 tablet, Rfl: 1 .  MULTIPLE VITAMINS-MINERALS ER PO, Take 1 tablet by mouth daily., Disp: , Rfl:  .  naftifine (NAFTIN) 1 % cream, Apply topically daily., Disp: 90 g, Rfl: 1 .  omega-3 fish oil (MAXEPA) 1000 MG CAPS capsule, Take 1 capsule by mouth daily., Disp: , Rfl:  .  Polyethylene Glycol 3350 POWD, Take 1 Dose by mouth as needed., Disp: , Rfl:  .  potassium chloride SA (K-DUR,KLOR-CON) 20 MEQ tablet, Take 1 tablet (20 mEq total) by mouth daily., Disp: 90 tablet, Rfl: 1 .  Semaglutide,0.25 or 0.5MG /DOS, (OZEMPIC, 0.25 OR 0.5 MG/DOSE,) 2 MG/1.5ML SOPN, Inject 0.5 mg into the skin once a week., Disp: 2 pen, Rfl: 2  Allergies  Allergen Reactions  . Hydrochlorothiazide     hyperparathyroidism  . Pneumococcal Vaccine Rash  . Pneumovax [Pneumococcal  Polysaccharide Vaccine]     I personally reviewed active problem list, medication list, allergies, family history, social history with the patient/caregiver today.   ROS  Constitutional: Negative for fever or weight change.  Respiratory: Negative for cough and shortness of breath.   Cardiovascular: Negative for chest pain or palpitations.  Gastrointestinal: Negative for abdominal pain, no bowel changes.  Musculoskeletal: Negative for gait problem or joint swelling.  Skin: Negative for rash.  Neurological: Negative for dizziness or headache.  No other specific complaints in a complete review of systems (except as listed in HPI above).    Objective  Vitals:   12/26/18 0911  BP: 122/80  Pulse: 90  Resp: 16  Temp: 98 F (36.7 C)  TempSrc: Oral  SpO2: 98%  Weight: 266 lb 9.6 oz (120.9 kg)  Height: 5\' 7"  (1.702 m)    Body mass index is 41.76 kg/m.  Physical Exam  Constitutional: Patient appears well-developed and well-nourished. Obese  No distress.  HEENT: head atraumatic, normocephalic, pupils equal and reactive to light,  neck supple, wearing a surgical mask  Cardiovascular: Normal rate, regular rhythm and normal heart sounds.  No murmur heard. 1 plus  BLE edema. Pulmonary/Chest: Effort normal and breath sounds normal. No respiratory distress. Abdominal: Soft.  There is no tenderness. Muscular Skeletal: crepitus with extension of both knees  Psychiatric: Patient has a normal mood and affect. behavior is normal. Judgment and thought content   normal. PHQ2/9: Depression screen [facility] 2/9 12/26/2018 09/13/2018 05/31/2018 02/24/2018 05/28/2017  Decreased Interest 0 0 0 0 0  Down, Depressed, Hopeless 0 0 0 0 0  PHQ - 2 Score 0 0 0 0 0  Altered sleeping 2 - 1 0 -  Tired, decreased energy 0 - 0 1 -  Change in appetite 0 - 0 0 -  Feeling bad or failure about yourself  0 - 0 0 -  Trouble concentrating 0 - 0 0 -  Moving slowly or fidgety/restless 0 - 0 0 -  Suicidal thoughts 0 -  0 0 -  PHQ-9 Score 2 - 1 1 -  Difficult doing work/chores Not difficult at  all - Not difficult at all - -    phq 9 is negative   Fall Risk: Fall Risk  12/26/2018 05/31/2018 02/24/2018 08/24/2017 05/28/2017  Falls in the past year? 0 No Yes No No  Comment - - January 18, 2018 - -  Number falls in past yr: 0 - 1 - -  Injury with Fall? 0 - No - -    Functional Status Survey: Is the patient deaf or have difficulty hearing?: No Does the patient have difficulty seeing, even when wearing glasses/contacts?: Yes Does the patient have difficulty concentrating, remembering, or making decisions?: No Does the patient have difficulty walking or climbing stairs?: No Does the patient have difficulty dressing or bathing?: No Does the patient have difficulty doing errands alone such as visiting a doctor's office or shopping?: No   Assessment & Plan  1. Type 2 diabetes, controlled, with neuropathy (HCC)  - POCT HgB A1C -5.5%  - gabapentin (NEURONTIN) 300 MG capsule; Take 1 capsule (300 mg total) by mouth 4 (four) times daily.  Dispense: 360 capsule; Refill: 1 - Semaglutide,0.25 or 0.5MG /DOS, (OZEMPIC, 0.25 OR 0.5 MG/DOSE,) 2 MG/1.5ML SOPN; Inject 0.5 mg into the skin once a week.  Dispense: 2 pen; Refill: 2 She will try going down to 0.25 mg daily and see is post-prandial fatigue improves, also explained she will feel better if she decreases carbohydrate intake   2. Dyslipidemia associated with type 2 diabetes mellitus ([place])   3. Essential (primary) hypertension  At goal   4. Bilateral edema of lower extremity  - potassium chloride SA (K-DUR) 20 MEQ tablet; Take 1 tablet (20 mEq total) by mouth daily.  Dispense: 90 tablet; Refill: 1  5. Simple chronic bronchitis ([place])   6. Hyperparathyroidism ([employer])  Seen by Dr. Honor Junes, reviewed labs and Pth and calcium stable 55 and 10.7  7. Acquired hypothyroidism  TSH done last month was at goal at Dr. Sherren Mocha office, continue current dose -  levothyroxine (SYNTHROID) 137 MCG tablet; Take 1 tablet (137 mcg total) by mouth daily before breakfast.  Dispense: 90 tablet; Refill: 1

## 2019-02-21 ENCOUNTER — Encounter: Payer: BLUE CROSS/BLUE SHIELD | Admitting: Family Medicine

## 2019-02-28 ENCOUNTER — Encounter: Payer: BLUE CROSS/BLUE SHIELD | Admitting: Family Medicine

## 2019-03-31 ENCOUNTER — Other Ambulatory Visit: Payer: Self-pay

## 2019-03-31 ENCOUNTER — Other Ambulatory Visit: Payer: Self-pay | Admitting: Family Medicine

## 2019-03-31 ENCOUNTER — Ambulatory Visit: Payer: BC Managed Care – PPO | Admitting: Family Medicine

## 2019-03-31 ENCOUNTER — Encounter: Payer: Self-pay | Admitting: Family Medicine

## 2019-03-31 VITALS — BP 140/80 | HR 65 | Temp 96.9°F | Resp 16 | Ht 67.0 in | Wt 271.5 lb

## 2019-03-31 DIAGNOSIS — E213 Hyperparathyroidism, unspecified: Secondary | ICD-10-CM | POA: Diagnosis not present

## 2019-03-31 DIAGNOSIS — D72829 Elevated white blood cell count, unspecified: Secondary | ICD-10-CM

## 2019-03-31 DIAGNOSIS — J410 Simple chronic bronchitis: Secondary | ICD-10-CM

## 2019-03-31 DIAGNOSIS — I10 Essential (primary) hypertension: Secondary | ICD-10-CM

## 2019-03-31 DIAGNOSIS — I517 Cardiomegaly: Secondary | ICD-10-CM

## 2019-03-31 DIAGNOSIS — E1140 Type 2 diabetes mellitus with diabetic neuropathy, unspecified: Secondary | ICD-10-CM

## 2019-03-31 DIAGNOSIS — E785 Hyperlipidemia, unspecified: Secondary | ICD-10-CM | POA: Diagnosis not present

## 2019-03-31 DIAGNOSIS — E1169 Type 2 diabetes mellitus with other specified complication: Secondary | ICD-10-CM

## 2019-03-31 DIAGNOSIS — E039 Hypothyroidism, unspecified: Secondary | ICD-10-CM | POA: Diagnosis not present

## 2019-03-31 DIAGNOSIS — R600 Localized edema: Secondary | ICD-10-CM

## 2019-03-31 DIAGNOSIS — R252 Cramp and spasm: Secondary | ICD-10-CM

## 2019-03-31 LAB — POCT GLYCOSYLATED HEMOGLOBIN (HGB A1C): Hemoglobin A1C: 5.8 % — AB (ref 4.0–5.6)

## 2019-03-31 MED ORDER — AMLODIPINE BESY-BENAZEPRIL HCL 5-40 MG PO CAPS: 1.0000 | ORAL_CAPSULE | Freq: Every day | ORAL | 1 refills | Status: DC

## 2019-03-31 MED ORDER — OZEMPIC (0.25 OR 0.5 MG/DOSE) 2 MG/1.5ML ~~LOC~~ SOPN: 0.5000 mg | PEN_INJECTOR | SUBCUTANEOUS | 2 refills | Status: DC

## 2019-03-31 MED ORDER — FUROSEMIDE 20 MG PO TABS: 20.0000 mg | ORAL_TABLET | Freq: Every day | ORAL | 1 refills | Status: DC

## 2019-03-31 MED ORDER — METOPROLOL SUCCINATE ER 100 MG PO TB24: 100.0000 mg | ORAL_TABLET | Freq: Every day | ORAL | 1 refills | Status: DC

## 2019-03-31 MED ORDER — ATORVASTATIN CALCIUM 40 MG PO TABS: 40.0000 mg | ORAL_TABLET | Freq: Every day | ORAL | 1 refills | Status: DC

## 2019-03-31 NOTE — Progress Notes (Signed)
Name: Charlene Hardy   MRN: [id number]    DOB: 1955/10/01   Date:03/31/2019       Progress Note  Subjective  Chief Complaint  Chief Complaint  Patient presents with  . Hypertension  . Hypothyroidism  . Dyslipidemia  . Obesity    HPI  DMII:she has been off Metforminand was on Trulicity for monthsbut since 09/2018 she has been on Ozempic and initially lost  6lbs but gained it back . Last eye exam no diabetic retinopathy12/2018, reminded her to go back. . She has peripheral neuropathy , doing better only occasionally has numbness on her legs, continue Gabapentin. She denies polyphagia, but she has polydipsia ( we will give her a note to have water near her at work - likely hyperparathyroidism ), she also has nocturiathe days she takes lasix and is stable. FSBS at home has been low 100's, the highest has been 112 lbs, A1C today was 5.8%   Morbid obesity: with multipleco-morbidities,also bilateral knee pain,weighhas gone up when she stopped taking Trulicity, 10 lbs over the past 4 months, but she is now on Ozempic and had lost 6 lbs but gained it back since last visit She has not been physically active   Bilateral knee arthralgias: switch from left to right intermittent knee pain, currently pain on right knee and intermittent effusion  and left foot pain. She is off Meloxicam because of kidney function and is taking Tylenol and Gabapentin, with good control of symptoms. Pain only with activity.She states uses ace bandage at work, advised to use a plantar fascitis brace  Chronic bronchitis: She is still smoking,down to 1 pack daily.She denies SOB, but she has a cough most days, usually dry. She states Combivent seems to help with symptoms.Advised to quit smoking again   HTN: she is taking medications as prescribed, bp is borderline today, but usually at goal No chest pain, SOBor palpitation. She used to have lower extremity edema and has been taking lasix, however states  taking every other day and the days she takes it she has nocturia. She is still not using compression stocking hoses   Dyslipidemia: low HDL, on Atorvastatin and otc fish oil , she has mild leg cramps but stable at this time, she is due for labs   Hypothyroidism: TSH done last month at [facility] clinic and at goal, continue current dose. Recheck labs, last time it was done 11/2018   Hyperparathyroidism: seeing Endocrinologist- Dr. Serena Croissant density showed mild osteopenia.Shesates cramps improved.Last [facility] done [facility]   Patient Active Problem List   Diagnosis Date Noted  . Morbid obesity ([place]) 09/13/2018  . Mild concentric left ventricular hypertrophy (LVH) 05/31/2018  . Carotid atherosclerosis, left 05/31/2018  . Primary hyperparathyroidism ([place]) 02/28/2016  . Arthralgia of both knees 10/23/2015  . Chronic bronchitis ([place]) 10/23/2015  . Osteopenia 10/23/2015  . Left ankle pain 04/25/2015  . Constipation 04/25/2015  . Allergic rhinitis 01/20/2015  . 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/2015  . Diabetic sensorimotor neuropathy ([place]) 01/20/2015  . Dyslipidemia 01/20/2015  . Essential (primary) hypertension 01/20/2015  . H/O iron deficiency anemia 01/20/2015  . Hypertensive retinopathy 01/20/2015  . Adult hypothyroidism 01/20/2015  . Extreme obesity 01/20/2015  . Background retinopathy due to secondary diabetes ([place]) 01/20/2015  . Tinea pedis 01/20/2015  . Vitamin D deficiency 02/26/2010  . Cigarette smoker 03/06/2008    Past Surgical History:  Procedure Laterality Date  . ABDOMINAL HYSTERECTOMY  08/11/1999   still has ovaries  . BREAST BIOPSY Right 09/05/2018  Affirm Bx- Ribbon clip- path pending  . COLONOSCOPY  08/11/2015  . COLONOSCOPY WITH PROPOFOL N/A 06/10/2016   Procedure: COLONOSCOPY WITH PROPOFOL;  Surgeon: Christene Lye, MD;  Location: [facility] ENDOSCOPY;  Service: Endoscopy;  Laterality: N/A;    Family History  Problem Relation Age of Onset   . Multiple sclerosis Mother   . Diabetes Father   . Breast cancer Sister 52  . Other Sister        DDD - she had to have back surgery  . Hypertension Maternal Grandmother   . Thyroid disease Paternal Uncle   . Breast cancer Paternal Aunt     Social History   Socioeconomic History  . Marital status: Single    Spouse name: Not on file  . Number of children: 0  . Years of education: Not on file  . Highest education level: High school graduate  Occupational History  . Not on file  Social Needs  . Financial resource strain: Not very hard  . Food insecurity    Worry: Never true    Inability: Never true  . Transportation needs    Medical: No    Non-medical: Not on file  Tobacco Use  . Smoking status: Current Every Day Smoker    Packs/day: 1.50    Years: 41.00    Pack years: 61.50    Types: Cigarettes    Start date: 08/10/1976  . Smokeless tobacco: Never Used  Substance and Sexual Activity  . Alcohol use: No    Alcohol/week: 0.0 standard drinks  . Drug use: No  . Sexual activity: Not Currently  Lifestyle  . Physical activity    Days per week: 3 days    Minutes per session: 20 min  . Stress: Not at all  Relationships  . Social connections    Talks on phone: More than three times a week    Gets together: More than three times a week    Attends religious service: More than 4 times per year    Active member of club or organization: No    Attends meetings of clubs or organizations: Never    Relationship status: Never married  . Intimate partner violence    Fear of current or ex partner: No    Emotionally abused: No    Physically abused: No    Forced sexual activity: No  Other Topics Concern  . Not on file  Social History Narrative  . Not on file     Current Outpatient Medications:  .  acetaminophen (TYLENOL) 500 MG tablet, Take 1 tablet (500 mg total) by mouth 2 (two) times daily., Disp: 30 tablet, Rfl: 0 .  amLODipine-benazepril (LOTREL) 5-40 MG capsule, Take 1  capsule by mouth daily., Disp: 90 capsule, Rfl: 1 .  aspirin 81 MG tablet, Take 1 tablet by mouth daily., Disp: , Rfl:  .  atorvastatin (LIPITOR) 40 MG tablet, Take 1 tablet (40 mg total) by mouth daily., Disp: 90 tablet, Rfl: 1 .  Cholecalciferol (VITAMIN D) 2000 UNITS tablet, Take 1 tablet by mouth daily., Disp: , Rfl:  .  ferrous sulfate 325 (65 FE) MG tablet, Take 1 tablet by mouth daily., Disp: , Rfl:  .  furosemide (LASIX) 20 MG tablet, Take 1 tablet (20 mg total) by mouth daily., Disp: 90 tablet, Rfl: 1 .  gabapentin (NEURONTIN) 300 MG capsule, Take 1 capsule (300 mg total) by mouth 4 (four) times daily., Disp: 360 capsule, Rfl: 1 .  Ipratropium-Albuterol (COMBIVENT RESPIMAT) 20-100 MCG/ACT  AERS respimat, Inhale 1 puff into the lungs every 6 (six) hours as needed for wheezing., Disp: 1 Inhaler, Rfl: 2 .  levothyroxine (SYNTHROID) 137 MCG tablet, Take 1 tablet (137 mcg total) by mouth daily before breakfast., Disp: 90 tablet, Rfl: 1 .  metoprolol (TOPROL-XL) 100 MG 24 hr tablet, Take 1 tablet (100 mg total) by mouth daily., Disp: 90 tablet, Rfl: 1 .  MULTIPLE VITAMINS-MINERALS ER PO, Take 1 tablet by mouth daily., Disp: , Rfl:  .  naftifine (NAFTIN) 1 % cream, Apply topically daily., Disp: 90 g, Rfl: 1 .  omega-3 fish oil (MAXEPA) 1000 MG CAPS capsule, Take 1 capsule by mouth daily., Disp: , Rfl:  .  Polyethylene Glycol 3350 POWD, Take 1 Dose by mouth as needed., Disp: , Rfl:  .  potassium chloride SA (K-DUR) 20 MEQ tablet, Take 1 tablet (20 mEq total) by mouth daily., Disp: 90 tablet, Rfl: 1 .  Semaglutide,0.25 or 0.5MG /DOS, (OZEMPIC, 0.25 OR 0.5 MG/DOSE,) 2 MG/1.5ML SOPN, Inject 0.5 mg into the skin once a week., Disp: 2 pen, Rfl: 2  Allergies  Allergen Reactions  . Hydrochlorothiazide     hyperparathyroidism  . Pneumococcal Vaccine Rash  . Pneumovax [Pneumococcal Polysaccharide Vaccine]     I personally reviewed active problem list, medication list, allergies, family history, social  history with the patient/caregiver today.   ROS  Constitutional: Negative for fever or weight change.  Respiratory: Negative for cough and shortness of breath.   Cardiovascular: Negative for chest pain or palpitations.  Gastrointestinal: Negative for abdominal pain, no bowel changes.  Musculoskeletal: Negative for gait problem or joint swelling.  Skin: Negative for rash.  Neurological: Negative for dizziness or headache.  No other specific complaints in a complete review of systems (except as listed in HPI above).  Objective  Vitals:   03/31/19 0813  BP: 140/80  Pulse: 65  Resp: 16  Temp: (!) 96.9 F (36.1 C)  TempSrc: Temporal  SpO2: 98%  Weight: 271 lb 8 oz (123.2 kg)  Height: 5\' 7"  (1.702 m)    Body mass index is 42.52 kg/m.  Physical Exam  Constitutional: Patient appears well-developed and well-nourished. Obese  No distress.  HEENT: head atraumatic, normocephalic, pupils equal and reactive to light, neck supple Cardiovascular: Normal rate, regular rhythm and normal heart sounds.  No murmur heard. Trace  BLE edema. Pulmonary/Chest: Effort normal  She has rhonchi bilaterally . No respiratory distress. Abdominal: Soft.  There is no tenderness. Psychiatric: Patient has a normal mood and affect. behavior is normal. Judgment and thought content normal.  Diabetic Foot Exam: Diabetic Foot Exam - Simple   Simple Foot Form Diabetic Foot exam was performed with the following findings: Yes 03/31/2019  8:30 AM  Visual Inspection See comments: Yes Sensation Testing Intact to touch and monofilament testing bilaterally: Yes Pulse Check Posterior Tibialis and Dorsalis pulse intact bilaterally: Yes Comments Flat feet and thick toenails       PHQ2/9: Depression screen [facility] 2/9 03/31/2019 12/26/2018 09/13/2018 05/31/2018 02/24/2018  Decreased Interest 0 0 0 0 0  Down, Depressed, Hopeless 0 0 0 0 0  PHQ - 2 Score 0 0 0 0 0  Altered sleeping 0 2 - 1 0  Tired, decreased energy 0  0 - 0 1  Change in appetite 0 0 - 0 0  Feeling bad or failure about yourself  0 0 - 0 0  Trouble concentrating 0 0 - 0 0  Moving slowly or fidgety/restless 0 0 - 0 0  Suicidal thoughts 0 0 - 0 0  PHQ-9 Score 0 2 - 1 1  Difficult doing work/chores - Not difficult at all - Not difficult at all -    phq 9 is negative   Fall Risk: Fall Risk  03/31/2019 12/26/2018 05/31/2018 02/24/2018 08/24/2017  Falls in the past year? 0 0 No Yes No  Comment - - - January 18, 2018 -  Number falls in past yr: 0 0 - 1 -  Injury with Fall? 0 0 - No -     Assessment & Plan  1. Type 2 diabetes, controlled, with neuropathy (HCC)  - POCT HgB A1C - Semaglutide,0.25 or 0.5MG /DOS, (OZEMPIC, 0.25 OR 0.5 MG/DOSE,) 2 MG/1.5ML SOPN; Inject 0.5 mg into the skin once a week.  Dispense: 2 pen; Refill: 2  2. Dyslipidemia associated with type 2 diabetes mellitus (HCC)  - Lipid panel - atorvastatin (LIPITOR) 40 MG tablet; Take 1 tablet (40 mg total) by mouth daily.  Dispense: 90 tablet; Refill: 1  3. Essential (primary) hypertension  - COMPLETE METABOLIC PANEL WITH GFR - CBC with Differential/Platelet - metoprolol succinate (TOPROL-XL) 100 MG 24 hr tablet; Take 1 tablet (100 mg total) by mouth daily.  Dispense: 90 tablet; Refill: 1 - amLODipine-benazepril (LOTREL) 5-40 MG capsule; Take 1 capsule by mouth daily.  Dispense: 90 capsule; Refill: 1  4. Hyperparathyroidism ([place])  Under the care of Dr. Honor Junes   5. Simple chronic bronchitis ([place])  Explained importance of quitting   6. Bilateral edema of lower extremity  - furosemide (LASIX) 20 MG tablet; Take 1 tablet (20 mg total) by mouth daily.  Dispense: 90 tablet; Refill: 1  7. Acquired hypothyroidism  - TSH  8. Leg cramps  Doing better   9. Mild concentric left ventricular hypertrophy (LVH)   10. Morbid obesity ([place])  Discussed with the patient the risk posed by an increased BMI. Discussed importance of portion control, calorie counting and at  least 150 minutes of physical activity weekly. Avoid sweet beverages and drink more water. Eat at least 6 servings of fruit and vegetables daily   11. Leukocytosis, unspecified type  Recheck level

## 2019-04-01 LAB — COMPLETE METABOLIC PANEL WITH GFR
AG Ratio: 1.5 (calc) (ref 1.0–2.5)
ALT: 10 U/L (ref 6–29)
AST: 11 U/L (ref 10–35)
Albumin: 4.1 g/dL (ref 3.6–5.1)
Alkaline phosphatase (APISO): 108 U/L (ref 37–153)
BUN: 11 mg/dL (ref 7–25)
CO2: 27 mmol/L (ref 20–32)
Calcium: 10.6 mg/dL — ABNORMAL HIGH (ref 8.6–10.4)
Chloride: 108 mmol/L (ref 98–110)
Creat: 0.8 mg/dL (ref 0.50–0.99)
GFR, Est African American: 91 mL/min/{1.73_m2} (ref >=60)
GFR, Est Non African American: 78 mL/min/{1.73_m2} (ref >=60)
Globulin: 2.7 g/dL (calc) (ref 1.9–3.7)
Glucose, Bld: 93 mg/dL (ref 65–99)
Potassium: 4.8 mmol/L (ref 3.5–5.3)
Sodium: 140 mmol/L (ref 135–146)
Total Bilirubin: 0.3 mg/dL (ref 0.2–1.2)
Total Protein: 6.8 g/dL (ref 6.1–8.1)

## 2019-04-01 LAB — HEMOGLOBIN A1C
Hgb A1c MFr Bld: 5.6 % of total Hgb (ref <5.7)
Mean Plasma Glucose: 114 (calc)
eAG (mmol/L): 6.3 (calc)

## 2019-04-01 LAB — CBC WITH DIFFERENTIAL/PLATELET
Absolute Monocytes: 722 cells/uL (ref 200–950)
Basophils Absolute: 69 cells/uL (ref 0–200)
Basophils Relative: 0.8 %
Eosinophils Absolute: 361 cells/uL (ref 15–500)
Eosinophils Relative: 4.2 %
HCT: 41.9 % (ref 35.0–45.0)
Hemoglobin: 13.5 g/dL (ref 11.7–15.5)
Lymphs Abs: 2210 cells/uL (ref 850–3900)
MCH: 26.9 pg — ABNORMAL LOW (ref 27.0–33.0)
MCHC: 32.2 g/dL (ref 32.0–36.0)
MCV: 83.5 fL (ref 80.0–100.0)
MPV: 11.6 fL (ref 7.5–12.5)
Monocytes Relative: 8.4 %
Neutro Abs: 5237 cells/uL (ref 1500–7800)
Neutrophils Relative %: 60.9 %
Platelets: 265 10*3/uL (ref 140–400)
RBC: 5.02 10*6/uL (ref 3.80–5.10)
RDW: 13.6 % (ref 11.0–15.0)
Total Lymphocyte: 25.7 %
WBC: 8.6 10*3/uL (ref 3.8–10.8)

## 2019-04-01 LAB — LIPID PANEL
Cholesterol: 142 mg/dL (ref <200)
HDL: 41 mg/dL — ABNORMAL LOW (ref >=50)
LDL Cholesterol (Calc): 81 mg/dL (calc)
Non-HDL Cholesterol (Calc): 101 mg/dL (calc) (ref <130)
Total CHOL/HDL Ratio: 3.5 (calc) (ref <5.0)
Triglycerides: 102 mg/dL (ref <150)

## 2019-04-16 ENCOUNTER — Other Ambulatory Visit: Payer: Self-pay | Admitting: Family Medicine

## 2019-04-16 DIAGNOSIS — B353 Tinea pedis: Secondary | ICD-10-CM

## 2019-04-25 ENCOUNTER — Ambulatory Visit (INDEPENDENT_AMBULATORY_CARE_PROVIDER_SITE_OTHER): Payer: BC Managed Care – PPO | Admitting: Family Medicine

## 2019-04-25 ENCOUNTER — Encounter: Payer: Self-pay | Admitting: Family Medicine

## 2019-04-25 ENCOUNTER — Other Ambulatory Visit: Payer: Self-pay

## 2019-04-25 VITALS — BP 140/70 | HR 76 | Temp 97.1°F | Resp 14 | Ht 67.0 in | Wt 269.8 lb

## 2019-04-25 DIAGNOSIS — R928 Other abnormal and inconclusive findings on diagnostic imaging of breast: Secondary | ICD-10-CM

## 2019-04-25 DIAGNOSIS — Z23 Encounter for immunization: Secondary | ICD-10-CM

## 2019-04-25 DIAGNOSIS — Z0000 Encounter for general adult medical examination without abnormal findings: Secondary | ICD-10-CM | POA: Diagnosis not present

## 2019-04-25 NOTE — Patient Instructions (Signed)
Ask insurance if they cover low dose CT scan for lung cancer screen   Preventive Care 63-63 Years Old, Female Preventive care refers to visits with your health care provider and lifestyle choices that can promote health and wellness. This includes:  A yearly physical exam. This may also be called an annual well check.  Regular dental visits and eye exams.  Immunizations.  Screening for certain conditions.  Healthy lifestyle choices, such as eating a healthy diet, getting regular exercise, not using drugs or products that contain nicotine and tobacco, and limiting alcohol use. What can I expect for my preventive care visit? Physical exam Your health care provider will check your:  Height and weight. This may be used to calculate body mass index (BMI), which tells if you are at a healthy weight.  Heart rate and blood pressure.  Skin for abnormal spots. Counseling Your health care provider may ask you questions about your:  Alcohol, tobacco, and drug use.  Emotional well-being.  Home and relationship well-being.  Sexual activity.  Eating habits.  Work and work Statistician.  Method of birth control.  Menstrual cycle.  Pregnancy history. What immunizations do I need?  Influenza (flu) vaccine  This is recommended every year. Tetanus, diphtheria, and pertussis (Tdap) vaccine  You may need a Td booster every 10 years. Varicella (chickenpox) vaccine  You may need this if you have not been vaccinated. Zoster (shingles) vaccine  You may need this after age 20. Measles, mumps, and rubella (MMR) vaccine  You may need at least one dose of MMR if you were born in 1957 or later. You may also need a second dose. Pneumococcal conjugate (PCV13) vaccine  You may need this if you have certain conditions and were not previously vaccinated. Pneumococcal polysaccharide (PPSV23) vaccine  You may need one or two doses if you smoke cigarettes or if you have certain conditions.  Meningococcal conjugate (MenACWY) vaccine  You may need this if you have certain conditions. Hepatitis A vaccine  You may need this if you have certain conditions or if you travel or work in places where you may be exposed to hepatitis A. Hepatitis B vaccine  You may need this if you have certain conditions or if you travel or work in places where you may be exposed to hepatitis B. Haemophilus influenzae type b (Hib) vaccine  You may need this if you have certain conditions. Human papillomavirus (HPV) vaccine  If recommended by your health care provider, you may need three doses over 6 months. You may receive vaccines as individual doses or as more than one vaccine together in one shot (combination vaccines). Talk with your health care provider about the risks and benefits of combination vaccines. What tests do I need? Blood tests  Lipid and cholesterol levels. These may be checked every 5 years, or more frequently if you are over 75 years old.  Hepatitis C test.  Hepatitis B test. Screening  Lung cancer screening. You may have this screening every year starting at age 63 if you have a 30-pack-year history of smoking and currently smoke or have quit within the past 15 years.  Colorectal cancer screening. All adults should have this screening starting at age 63 and continuing until age 75. Your health care provider may recommend screening at age 33 if you are at increased risk. You will have tests every 1-10 years, depending on your results and the type of screening test.  Diabetes screening. This is done by checking your blood  sugar (glucose) after you have not eaten for a while (fasting). You may have this done every 1-3 years.  Mammogram. This may be done every 1-2 years. Talk with your health care provider about when you should start having regular mammograms. This may depend on whether you have a family history of breast cancer.  BRCA-related cancer screening. This may be done  if you have a family history of breast, ovarian, tubal, or peritoneal cancers.  Pelvic exam and Pap test. This may be done every 3 years starting at age 63. Starting at age 18, this may be done every 5 years if you have a Pap test in combination with an HPV test. Other tests  Sexually transmitted disease (STD) testing.  Bone density scan. This is done to screen for osteoporosis. You may have this scan if you are at high risk for osteoporosis. Follow these instructions at home: Eating and drinking  Eat a diet that includes fresh fruits and vegetables, whole grains, lean protein, and low-fat dairy.  Take vitamin and mineral supplements as recommended by your health care provider.  Do not drink alcohol if: ? Your health care provider tells you not to drink. ? You are pregnant, may be pregnant, or are planning to become pregnant.  If you drink alcohol: ? Limit how much you have to 0-1 drink a day. ? Be aware of how much alcohol is in your drink. In the [place] one drink equals one 12 oz bottle of beer (355 mL), one 5 oz glass of wine (148 mL), or one 1 oz glass of hard liquor (44 mL). Lifestyle  Take daily care of your teeth and gums.  Stay active. Exercise for at least 30 minutes on 5 or more days each week.  Do not use any products that contain nicotine or tobacco, such as cigarettes, e-cigarettes, and chewing tobacco. If you need help quitting, ask your health care provider.  If you are sexually active, practice safe sex. Use a condom or other form of birth control (contraception) in order to prevent pregnancy and STIs (sexually transmitted infections).  If told by your health care provider, take low-dose aspirin daily starting at age 42. What's next?  Visit your health care provider once a year for a well check visit.  Ask your health care provider how often you should have your eyes and teeth checked.  Stay up to date on all vaccines. This information is not intended to  replace advice given to you by your health care provider. Make sure you discuss any questions you have with your health care provider. Document Released: 08/23/2015 Document Revised: 04/07/2018 Document Reviewed: 04/07/2018 Elsevier Patient Education  2020 [employer].

## 2019-04-25 NOTE — Progress Notes (Signed)
Name: Charlene Hardy   MRN: [id number]    DOB: Dec 08, 1955   Date:04/25/2019       Progress Note  Subjective  Chief Complaint  Chief Complaint  Patient presents with  . Annual Exam    HPI  Patient presents for annual CPE.  Diet: she is likes carbohydrates.  Exercise: needs 150 exercise per week   USPSTF grade A and B recommendations    Office Visit from 04/25/2019 in [facility]  AUDIT-C Score  0     Depression: Phq 9 is  negative Depression screen [facility] 2/9 04/25/2019 04/25/2019 03/31/2019 12/26/2018 09/13/2018  Decreased Interest 0 0 0 0 0  Down, Depressed, Hopeless 0 0 0 0 0  PHQ - 2 Score 0 0 0 0 0  Altered sleeping 2 0 0 2 -  Tired, decreased energy 1 0 0 0 -  Change in appetite 0 0 0 0 -  Feeling bad or failure about yourself  0 0 0 0 -  Trouble concentrating 0 0 0 0 -  Moving slowly or fidgety/restless 0 0 0 0 -  Suicidal thoughts 0 0 0 0 -  PHQ-9 Score 3 0 0 2 -  Difficult doing work/chores Not difficult at all - - Not difficult at all -  Some recent data might be hidden   Hypertension: BP Readings from Last 3 Encounters:  04/25/19 140/70  03/31/19 140/80  12/26/18 122/80   Obesity: Wt Readings from Last 3 Encounters:  04/25/19 269 lb 12.8 oz (122.4 kg)  03/31/19 271 lb 8 oz (123.2 kg)  12/26/18 266 lb 9.6 oz (120.9 kg)   BMI Readings from Last 3 Encounters:  04/25/19 42.26 kg/m  03/31/19 42.52 kg/m  12/26/18 41.76 kg/m     Hep C Screening: up to date  STD testing and prevention (HIV/chl/gon/syphilis): N/A Intimate partner violence:negative screen  Sexual History/Pain during Intercourse: not sexually active for over one year  Menstrual History/LMP/Abnormal Bleeding: discussed post-menopausal bleeding  Incontinence Symptoms: no problems  Breast cancer:  - Last Mammogram: needs to see surgeon - BRCA gene screening: only on aunt had breast cancer  Osteoporosis Screening: discussed high calcium and vitamin D diet   Cervical cancer  screening: not indicated, s/p hysterectomy   Skin cancer: discussed atypical lesions  Colorectal cancer: repeat in 2022 Lung cancer:   Low Dose CT Chest recommended if Age 93-80 years, 30 pack-year currently smoking OR have quit w/in 15years. Patient does qualify.  She will think about it and let me know  ECG:04/2018   Advanced Care Planning: A voluntary discussion about advance care planning including the explanation and discussion of advance directives.  Discussed health care proxy and Living will, and the patient was able to identify a health care proxy as sister .    Lipids: Lab Results  Component Value Date   CHOL 142 03/31/2019   CHOL 125 02/24/2018   CHOL 218 (H) 05/28/2017   Lab Results  Component Value Date   HDL 41 (L) 03/31/2019   HDL 39 (L) 02/24/2018   HDL 45 (L) 05/28/2017   Lab Results  Component Value Date   LDLCALC 81 03/31/2019   LDLCALC 65 02/24/2018   LDLCALC 142 (H) 05/28/2017   Lab Results  Component Value Date   TRIG 102 03/31/2019   TRIG 125 02/24/2018   TRIG 173 (H) 05/28/2017   Lab Results  Component Value Date   CHOLHDL 3.5 03/31/2019   CHOLHDL 3.2 02/24/2018   CHOLHDL 4.8 05/28/2017  No results found for: LDLDIRECT  Glucose: Glucose  Date Value Ref Range Status  08/18/2014 109 (H) 65 - 99 mg/dL Final   Glucose, Bld  Date Value Ref Range Status  03/31/2019 93 65 - 99 mg/dL Final    Comment:    .            Fasting reference interval .   05/31/2018 89 65 - 99 mg/dL Final    Comment:    .            Fasting reference interval .   02/24/2018 84 65 - 99 mg/dL Final    Comment:    .            Fasting reference interval .    Glucose-Capillary  Date Value Ref Range Status  06/10/2016 85 65 - 99 mg/dL Final    Patient Active Problem List   Diagnosis Date Noted  . Morbid obesity ([place]) 09/13/2018  . Mild concentric left ventricular hypertrophy (LVH) 05/31/2018  . Carotid atherosclerosis, left 05/31/2018  . Primary  hyperparathyroidism ([place]) 02/28/2016  . Arthralgia of both knees 10/23/2015  . Chronic bronchitis ([place]) 10/23/2015  . Osteopenia 10/23/2015  . Left ankle pain 04/25/2015  . Constipation 04/25/2015  . Allergic rhinitis 01/20/2015  . 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/2015  . Diabetic sensorimotor neuropathy ([place]) 01/20/2015  . Dyslipidemia 01/20/2015  . Essential (primary) hypertension 01/20/2015  . H/O iron deficiency anemia 01/20/2015  . Hypertensive retinopathy 01/20/2015  . Adult hypothyroidism 01/20/2015  . Extreme obesity 01/20/2015  . Background retinopathy due to secondary diabetes ([place]) 01/20/2015  . Tinea pedis 01/20/2015  . Vitamin D deficiency 02/26/2010  . Cigarette smoker 03/06/2008    Past Surgical History:  Procedure Laterality Date  . ABDOMINAL HYSTERECTOMY  08/11/1999   still has ovaries  . BREAST BIOPSY Right 09/05/2018   Affirm Bx- Ribbon clip- path pending  . COLONOSCOPY  08/11/2015  . COLONOSCOPY WITH PROPOFOL N/A 06/10/2016   Procedure: COLONOSCOPY WITH PROPOFOL;  Surgeon: Christene Lye, MD;  Location: [facility] ENDOSCOPY;  Service: Endoscopy;  Laterality: N/A;    Family History  Problem Relation Age of Onset  . Multiple sclerosis Mother   . Diabetes Father   . Breast cancer Sister 67  . Other Sister        DDD - she had to have back surgery  . Hypertension Maternal Grandmother   . Thyroid disease Paternal Uncle   . Breast cancer Paternal Aunt     Social History   Socioeconomic History  . Marital status: Single    Spouse name: Not on file  . Number of children: 0  . Years of education: Not on file  . Highest education level: High school graduate  Occupational History  . Not on file  Social Needs  . Financial resource strain: Not very hard  . Food insecurity    Worry: Never true    Inability: Never true  . Transportation needs    Medical: No    Non-medical: Not on file  Tobacco Use  . Smoking status: Current Every Day Smoker     Packs/day: 1.00    Years: 41.00    Pack years: 41.00    Types: Cigarettes    Start date: 08/10/1976  . Smokeless tobacco: Never Used  Substance and Sexual Activity  . Alcohol use: No    Alcohol/week: 0.0 standard drinks  . Drug use: No  . Sexual activity: Not Currently  Lifestyle  . Physical activity  Days per week: 3 days    Minutes per session: 20 min  . Stress: Not at all  Relationships  . Social connections    Talks on phone: More than three times a week    Gets together: More than three times a week    Attends religious service: More than 4 times per year    Active member of club or organization: No    Attends meetings of clubs or organizations: Never    Relationship status: Never married  . Intimate partner violence    Fear of current or ex partner: No    Emotionally abused: No    Physically abused: No    Forced sexual activity: No  Other Topics Concern  . Not on file  Social History Narrative  . Not on file     Current Outpatient Medications:  .  acetaminophen (TYLENOL) 500 MG tablet, Take 1 tablet (500 mg total) by mouth 2 (two) times daily., Disp: 30 tablet, Rfl: 0 .  amLODipine-benazepril (LOTREL) 5-40 MG capsule, Take 1 capsule by mouth daily., Disp: 90 capsule, Rfl: 1 .  aspirin 81 MG tablet, Take 1 tablet by mouth daily., Disp: , Rfl:  .  atorvastatin (LIPITOR) 40 MG tablet, Take 1 tablet (40 mg total) by mouth daily., Disp: 90 tablet, Rfl: 1 .  Cholecalciferol (VITAMIN D) 2000 UNITS tablet, Take 1 tablet by mouth daily., Disp: , Rfl:  .  ferrous sulfate 325 (65 FE) MG tablet, Take 1 tablet by mouth daily., Disp: , Rfl:  .  furosemide (LASIX) 20 MG tablet, Take 1 tablet (20 mg total) by mouth daily., Disp: 90 tablet, Rfl: 1 .  gabapentin (NEURONTIN) 300 MG capsule, Take 1 capsule (300 mg total) by mouth 4 (four) times daily., Disp: 360 capsule, Rfl: 1 .  Ipratropium-Albuterol (COMBIVENT RESPIMAT) 20-100 MCG/ACT AERS respimat, Inhale 1 puff into the lungs  every 6 (six) hours as needed for wheezing., Disp: 1 Inhaler, Rfl: 2 .  levothyroxine (SYNTHROID) 137 MCG tablet, Take 1 tablet (137 mcg total) by mouth daily before breakfast., Disp: 90 tablet, Rfl: 1 .  metoprolol succinate (TOPROL-XL) 100 MG 24 hr tablet, Take 1 tablet (100 mg total) by mouth daily., Disp: 90 tablet, Rfl: 1 .  MULTIPLE VITAMINS-MINERALS ER PO, Take 1 tablet by mouth daily., Disp: , Rfl:  .  naftifine (NAFTIN) 1 % cream, APPLY  CREAM TOPICALLY ONCE DAILY, Disp: 90 g, Rfl: 0 .  omega-3 fish oil (MAXEPA) 1000 MG CAPS capsule, Take 1 capsule by mouth daily., Disp: , Rfl:  .  Polyethylene Glycol 3350 POWD, Take 1 Dose by mouth as needed., Disp: , Rfl:  .  potassium chloride SA (K-DUR) 20 MEQ tablet, Take 1 tablet (20 mEq total) by mouth daily., Disp: 90 tablet, Rfl: 1 .  Semaglutide,0.25 or 0.5MG/DOS, (OZEMPIC, 0.25 OR 0.5 MG/DOSE,) 2 MG/1.5ML SOPN, Inject 0.5 mg into the skin once a week., Disp: 2 pen, Rfl: 2  Allergies  Allergen Reactions  . Hydrochlorothiazide     hyperparathyroidism  . Pneumococcal Vaccine Rash  . Pneumovax [Pneumococcal Polysaccharide Vaccine]      ROS  Constitutional: Negative for fever or weight change.  Respiratory: Negative for cough and shortness of breath.   Cardiovascular: Negative for chest pain or palpitations.  Gastrointestinal: Negative for abdominal pain, no bowel changes.  Musculoskeletal: Negative for gait problem or joint swelling.  Skin: Negative for rash.  Neurological: Negative for dizziness or headache.  No other specific complaints in a complete review  of systems (except as listed in HPI above).   Objective  Vitals:   04/25/19 1358  BP: 140/70  Pulse: 76  Resp: 14  Temp: (!) 97.1 F (36.2 C)  SpO2: 98%  Weight: 269 lb 12.8 oz (122.4 kg)  Height: '5\' 7"'  (1.702 m)    Body mass index is 42.26 kg/m.  Physical Exam  Constitutional: Patient appears well-developed and obese . No distress.  HENT: Head: Normocephalic  and atraumatic. Ears: B TMs ok, no erythema or effusion; Nose: Nose normal. Mouth/Throat: Oropharynx is clear and moist. No oropharyngeal exudate.  Eyes: Conjunctivae and EOM are normal. Pupils are equal, round, and reactive to light. No scleral icterus.  Neck: Normal range of motion. Neck supple. No JVD present. No thyromegaly present.  Cardiovascular: Normal rate, regular rhythm and normal heart sounds.  No murmur heard. No BLE edema. Pulmonary/Chest: Effort normal and breath sounds normal. No respiratory distress. Abdominal: Soft. Bowel sounds are normal, no distension. There is no tenderness. no masses Breast: no lumps or masses, no nipple discharge or rashes FEMALE GENITALIA:  Not done  RECTAL:not done  Musculoskeletal: Normal range of motion, no joint effusions. No gross deformities Neurological: he is alert and oriented to person, place, and time. No cranial nerve deficit. Coordination, balance, strength, speech and gait are normal.  Skin: Skin is warm and dry. No rash noted. No erythema.  Psychiatric: Patient has a normal mood and affect. behavior is normal. Judgment and thought content normal.  Recent Results (from the past 2160 hour(s))  POCT HgB A1C     Status: Abnormal   Collection Time: 03/31/19  8:15 AM  Result Value Ref Range   Hemoglobin A1C 5.8 (A) 4.0 - 5.6 %   HbA1c POC (<> result, manual entry)     HbA1c, POC (prediabetic range)     HbA1c, POC (controlled diabetic range)    Lipid panel     Status: Abnormal   Collection Time: 03/31/19  8:48 AM  Result Value Ref Range   Cholesterol 142 <200 mg/dL   HDL 41 (L) > OR = 50 mg/dL   Triglycerides 102 <150 mg/dL   LDL Cholesterol (Calc) 81 mg/dL (calc)    Comment: Reference range: <100 . Desirable range <100 mg/dL for primary prevention;   <70 mg/dL for patients with CHD or diabetic patients  with > or = 2 CHD risk factors. Marland Kitchen LDL-C is now calculated using the Martin-Hopkins  calculation, which is a validated novel  method providing  better accuracy than the Friedewald equation in the  estimation of LDL-C.  Cresenciano Genre et al. Annamaria Helling. 4497;530(05): 2061-2068  (http://education.[facility].com/faq/FAQ164)    Total CHOL/HDL Ratio 3.5 <5.0 (calc)   Non-HDL Cholesterol (Calc) 101 <130 mg/dL (calc)    Comment: For patients with diabetes plus 1 major ASCVD risk  factor, treating to a non-HDL-C goal of <100 mg/dL  (LDL-C of <70 mg/dL) is considered a therapeutic  option.   COMPLETE METABOLIC PANEL WITH GFR     Status: Abnormal   Collection Time: 03/31/19  8:48 AM  Result Value Ref Range   Glucose, Bld 93 65 - 99 mg/dL    Comment: .            Fasting reference interval .    BUN 11 7 - 25 mg/dL   Creat 0.80 0.50 - 0.99 mg/dL    Comment: For patients >73 years of age, the reference limit for Creatinine is approximately 13% higher for people identified as African-American. Marland Kitchen  GFR, Est Non African American 78 > OR = 60 mL/min/1.67m   GFR, Est African American 91 > OR = 60 mL/min/1.753m  BUN/Creatinine Ratio NOT APPLICABLE 6 - 22 (calc)   Sodium 140 135 - 146 mmol/L   Potassium 4.8 3.5 - 5.3 mmol/L   Chloride 108 98 - 110 mmol/L   CO2 27 20 - 32 mmol/L   Calcium 10.6 (H) 8.6 - 10.4 mg/dL   Total Protein 6.8 6.1 - 8.1 g/dL   Albumin 4.1 3.6 - 5.1 g/dL   Globulin 2.7 1.9 - 3.7 g/dL (calc)   AG Ratio 1.5 1.0 - 2.5 (calc)   Total Bilirubin 0.3 0.2 - 1.2 mg/dL   Alkaline phosphatase (APISO) 108 37 - 153 U/L   AST 11 10 - 35 U/L   ALT 10 6 - 29 U/L  CBC with Differential/Platelet     Status: Abnormal   Collection Time: 03/31/19  8:48 AM  Result Value Ref Range   WBC 8.6 3.8 - 10.8 Thousand/uL   RBC 5.02 3.80 - 5.10 Million/uL   Hemoglobin 13.5 11.7 - 15.5 g/dL   HCT 41.9 35.0 - 45.0 %   MCV 83.5 80.0 - 100.0 fL   MCH 26.9 (L) 27.0 - 33.0 pg   MCHC 32.2 32.0 - 36.0 g/dL   RDW 13.6 11.0 - 15.0 %   Platelets 265 140 - 400 Thousand/uL   MPV 11.6 7.5 - 12.5 fL   Neutro Abs 5,237 1,500 -  7,800 cells/uL   Lymphs Abs 2,210 850 - 3,900 cells/uL   Absolute Monocytes 722 200 - 950 cells/uL   Eosinophils Absolute 361 15 - 500 cells/uL   Basophils Absolute 69 0 - 200 cells/uL   Neutrophils Relative % 60.9 %   Total Lymphocyte 25.7 %   Monocytes Relative 8.4 %   Eosinophils Relative 4.2 %   Basophils Relative 0.8 %  Hemoglobin A1c     Status: None   Collection Time: 03/31/19  8:48 AM  Result Value Ref Range   Hgb A1c MFr Bld 5.6 <5.7 % of total Hgb    Comment: For the purpose of screening for the presence of diabetes: . <5.7%       Consistent with the absence of diabetes 5.7-6.4%    Consistent with increased risk for diabetes             (prediabetes) > or =6.5%  Consistent with diabetes . This assay result is consistent with a decreased risk of diabetes. . Currently, no consensus exists regarding use of hemoglobin A1c for diagnosis of diabetes in children. . According to American Diabetes Association (ADA) guidelines, hemoglobin A1c <7.0% represents optimal control in non-pregnant diabetic patients. Different metrics may apply to specific patient populations.  Standards of Medical Care in Diabetes(ADA). .    Mean Plasma Glucose 114 (calc)   eAG (mmol/L) 6.3 (calc)      Fall Risk: Fall Risk  04/25/2019 04/25/2019 03/31/2019 12/26/2018 05/31/2018  Falls in the past year? 0 0 0 0 No  Comment - - - - -  Number falls in past yr: 0 0 0 0 -  Injury with Fall? 0 0 0 0 -     Functional Status Survey: Is the patient deaf or have difficulty hearing?: No Does the patient have difficulty seeing, even when wearing glasses/contacts?: No Does the patient have difficulty concentrating, remembering, or making decisions?: No Does the patient have difficulty walking or climbing stairs?: No Does the patient have difficulty dressing  or bathing?: No Does the patient have difficulty doing errands alone such as visiting a doctor's office or shopping?: No   Assessment &  Plan  1. Abnormal mammogram of right breast  Explained radiologist concern about false negative findings on biopsy and she is willing to see surgeon, she states her sister and another friend have seen Dr. Fleet Contras  2. Well adult exam  Discussed healthy diet and exercise  3. Flu vaccine need  - Flu Vaccine QUAD 6+ mos PF IM (Fluarix Quad PF)  4. Need for shingles vaccine  - Varicella-zoster vaccine IM   -USPSTF grade A and B recommendations reviewed with patient; age-appropriate recommendations, preventive care, screening tests, etc discussed and encouraged; healthy living encouraged; see AVS for patient education given to patient -Discussed importance of 150 minutes of physical activity weekly, eat two servings of fish weekly, eat one serving of tree nuts ( cashews, pistachios, pecans, almonds.Marland Kitchen) every other day, eat 6 servings of fruit/vegetables daily and drink plenty of water and avoid sweet beverages.

## 2019-04-27 ENCOUNTER — Telehealth: Payer: Self-pay

## 2019-04-27 NOTE — Telephone Encounter (Signed)
Left message for patient to call us back regarding information she wanted to provide.

## 2019-04-27 NOTE — Telephone Encounter (Signed)
Pt called back pls call her [phone]

## 2019-04-27 NOTE — Telephone Encounter (Signed)
Copied from [place] [phone]. Topic: General - Other >> Apr 26, 2019  4:12 PM Pauline Good wrote: Reason for CRM: pt need call back from nurse to discuss yesterday visit and she has some informtation to report back

## 2019-04-27 NOTE — Telephone Encounter (Signed)
Copied from [place] [phone]. Topic: General - Other >> Apr 26, 2019  4:12 PM Pauline Good wrote: Reason for CRM: pt need call back from nurse to discuss yesterday visit and she has some informtation to report back

## 2019-04-28 NOTE — Telephone Encounter (Signed)
Called patient she was unavailable. Advised over voicemail to please leave information with [facility] so we are able to document it in her chart and give it to Dr. Ancil Boozer.

## 2019-05-16 ENCOUNTER — Ambulatory Visit: Payer: BLUE CROSS/BLUE SHIELD | Admitting: General Surgery

## 2019-05-18 DIAGNOSIS — R928 Other abnormal and inconclusive findings on diagnostic imaging of breast: Secondary | ICD-10-CM | POA: Diagnosis not present

## 2019-05-20 ENCOUNTER — Other Ambulatory Visit: Payer: Self-pay | Admitting: General Surgery

## 2019-05-20 DIAGNOSIS — R928 Other abnormal and inconclusive findings on diagnostic imaging of breast: Secondary | ICD-10-CM

## 2019-06-20 DIAGNOSIS — H524 Presbyopia: Secondary | ICD-10-CM | POA: Diagnosis not present

## 2019-06-20 DIAGNOSIS — H5213 Myopia, bilateral: Secondary | ICD-10-CM | POA: Diagnosis not present

## 2019-06-20 DIAGNOSIS — E119 Type 2 diabetes mellitus without complications: Secondary | ICD-10-CM | POA: Diagnosis not present

## 2019-06-20 DIAGNOSIS — Z135 Encounter for screening for eye and ear disorders: Secondary | ICD-10-CM | POA: Diagnosis not present

## 2019-07-05 ENCOUNTER — Ambulatory Visit: Payer: BC Managed Care – PPO | Admitting: Family Medicine

## 2019-07-05 ENCOUNTER — Encounter: Payer: Self-pay | Admitting: Family Medicine

## 2019-07-05 ENCOUNTER — Other Ambulatory Visit: Payer: Self-pay

## 2019-07-05 VITALS — BP 130/60 | HR 78 | Temp 97.8°F | Resp 16 | Ht 67.0 in | Wt 266.8 lb

## 2019-07-05 DIAGNOSIS — I517 Cardiomegaly: Secondary | ICD-10-CM

## 2019-07-05 DIAGNOSIS — E213 Hyperparathyroidism, unspecified: Secondary | ICD-10-CM

## 2019-07-05 DIAGNOSIS — E1169 Type 2 diabetes mellitus with other specified complication: Secondary | ICD-10-CM | POA: Diagnosis not present

## 2019-07-05 DIAGNOSIS — J410 Simple chronic bronchitis: Secondary | ICD-10-CM | POA: Diagnosis not present

## 2019-07-05 DIAGNOSIS — E785 Hyperlipidemia, unspecified: Secondary | ICD-10-CM

## 2019-07-05 DIAGNOSIS — E1140 Type 2 diabetes mellitus with diabetic neuropathy, unspecified: Secondary | ICD-10-CM | POA: Diagnosis not present

## 2019-07-05 DIAGNOSIS — E039 Hypothyroidism, unspecified: Secondary | ICD-10-CM

## 2019-07-05 DIAGNOSIS — I10 Essential (primary) hypertension: Secondary | ICD-10-CM | POA: Diagnosis not present

## 2019-07-05 MED ORDER — OZEMPIC (0.25 OR 0.5 MG/DOSE) 2 MG/1.5ML ~~LOC~~ SOPN: 0.5000 mg | PEN_INJECTOR | SUBCUTANEOUS | 2 refills | Status: DC

## 2019-07-05 MED ORDER — ATORVASTATIN CALCIUM 40 MG PO TABS: 40.0000 mg | ORAL_TABLET | Freq: Every day | ORAL | 1 refills | Status: DC

## 2019-07-05 NOTE — Progress Notes (Signed)
Name: Charlene Hardy   MRN: [id number]    DOB: Apr 24, 1956   Date:07/05/2019       Progress Note  Subjective  Chief Complaint  Chief Complaint  Patient presents with  . Medication Refill  . Diabetes  . Hypertension  . Hypothyroidism    HPI  DMII:she has been off Metforminandwas on Trulicity for monthsbut since 09/2018 she has been on Ozempic. Last eye exam was two weeks ago and we will obtain a copy.She has peripheral neuropathy , doing better only occasionally has numbness on her legs, continue Gabapentin.She denies polyphagia, but she has polydipsia ( she has hyperparathyroidism )  she also has nocturiathe days she takes lasixand is stable.FSBS at home around 95-96 , no hypoglycemic episodes   Morbid obesity: with multipleco-morbidities,also bilateral knee pain, she is losing weight again, down 5 lbs since last visit, continue medication and discussed life style modification   Bilateral knee arthralgias: switch from left to right intermittent knee pain, currently pain on right knee and intermittent effusion  and left foot pain. Sheis offMeloxicam because of kidney function and is taking Tylenol and Gabapentin, she states pain is stable. Pain only with activity.   Chronic bronchitis: She is still smoking,down to 1 pack daily.She denies SOB or wheezing,  but she has a cough most days, usually dry. She states Combivent seems to help with symptoms.Advised to quit smoking again , unchanged.   HTN: she is taking medications as prescribed, bp is at goal. No chest pain, SOBor palpitation. She used to have lower extremity edema and has been taking lasix, however states taking every other day and the days she takes it she has nocturia.She is still not using compression stocking hoses but symptoms are improving   Dyslipidemia: low HDL, on Atorvastatin and otc fish oil, she has mild leg cramps but mostly when getting out of her bed, but doing better,  reviewed last and LDL  had gone up a little.   Hypothyroidism:TSH done 11/2018 at [facility] clinic and at goal, however he was giving her 150 mcg M-S and half on Sundays and pharmacy requested me for a refill of 137 mcg and she has been taking one daily and half on Sundays, we will recheck level today and resume previous dose if needed. Explained that only one provider to rx her thyroid medication   Hyperparathyroidism: seeing Endocrinologist- Dr. Serena Croissant density showed mild osteopenia.Shesates cramps improved.Last labs done April at [facility] Last calcium stable    Patient Active Problem List   Diagnosis Date Noted  . Morbid obesity ([place]) 09/13/2018  . Mild concentric left ventricular hypertrophy (LVH) 05/31/2018  . Carotid atherosclerosis, left 05/31/2018  . Primary hyperparathyroidism ([place]) 02/28/2016  . Arthralgia of both knees 10/23/2015  . Chronic bronchitis ([place]) 10/23/2015  . Osteopenia 10/23/2015  . Left ankle pain 04/25/2015  . Constipation 04/25/2015  . Allergic rhinitis 01/20/2015  . 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/2015  . Diabetic sensorimotor neuropathy ([place]) 01/20/2015  . Dyslipidemia 01/20/2015  . Essential (primary) hypertension 01/20/2015  . H/O iron deficiency anemia 01/20/2015  . Hypertensive retinopathy 01/20/2015  . Adult hypothyroidism 01/20/2015  . Extreme obesity 01/20/2015  . Background retinopathy due to secondary diabetes ([place]) 01/20/2015  . Tinea pedis 01/20/2015  . Vitamin D deficiency 02/26/2010  . Cigarette smoker 03/06/2008    Past Surgical History:  Procedure Laterality Date  . ABDOMINAL HYSTERECTOMY  08/11/1999   still has ovaries  . BREAST BIOPSY Right 09/05/2018   Affirm Bx- Ribbon clip- path pending  .  COLONOSCOPY  08/11/2015  . COLONOSCOPY WITH PROPOFOL N/A 06/10/2016   Procedure: COLONOSCOPY WITH PROPOFOL;  Surgeon: Christene Lye, MD;  Location: [facility] ENDOSCOPY;  Service: Endoscopy;  Laterality: N/A;    Family History  Problem Relation  Age of Onset  . Multiple sclerosis Mother   . Diabetes Father   . Breast cancer Sister 55  . Other Sister        DDD - she had to have back surgery  . Hypertension Maternal Grandmother   . Thyroid disease Paternal Uncle   . Breast cancer Paternal Aunt     Social History   Socioeconomic History  . Marital status: Single    Spouse name: Not on file  . Number of children: 0  . Years of education: Not on file  . Highest education level: High school graduate  Occupational History  . Not on file  Social Needs  . Financial resource strain: Not very hard  . Food insecurity    Worry: Never true    Inability: Never true  . Transportation needs    Medical: No    Non-medical: Not on file  Tobacco Use  . Smoking status: Current Every Day Smoker    Packs/day: 1.00    Years: 41.00    Pack years: 41.00    Types: Cigarettes    Start date: 08/10/1976  . Smokeless tobacco: Never Used  . Tobacco comment: cutting down, smoking half pack lately   Substance and Sexual Activity  . Alcohol use: No    Alcohol/week: 0.0 standard drinks  . Drug use: No  . Sexual activity: Not Currently  Lifestyle  . Physical activity    Days per week: 3 days    Minutes per session: 20 min  . Stress: Not at all  Relationships  . Social connections    Talks on phone: More than three times a week    Gets together: More than three times a week    Attends religious service: More than 4 times per year    Active member of club or organization: No    Attends meetings of clubs or organizations: Never    Relationship status: Never married  . Intimate partner violence    Fear of current or ex partner: No    Emotionally abused: No    Physically abused: No    Forced sexual activity: No  Other Topics Concern  . Not on file  Social History Narrative  . Not on file     Current Outpatient Medications:  .  acetaminophen (TYLENOL) 500 MG tablet, Take 1 tablet (500 mg total) by mouth 2 (two) times daily., Disp: 30  tablet, Rfl: 0 .  amLODipine-benazepril (LOTREL) 5-40 MG capsule, Take 1 capsule by mouth daily., Disp: 90 capsule, Rfl: 1 .  aspirin 81 MG tablet, Take 1 tablet by mouth daily., Disp: , Rfl:  .  atorvastatin (LIPITOR) 40 MG tablet, Take 1 tablet (40 mg total) by mouth daily., Disp: 90 tablet, Rfl: 1 .  Cholecalciferol (VITAMIN D) 2000 UNITS tablet, Take 1 tablet by mouth daily., Disp: , Rfl:  .  ferrous sulfate 325 (65 FE) MG tablet, Take 1 tablet by mouth daily., Disp: , Rfl:  .  furosemide (LASIX) 20 MG tablet, Take 1 tablet (20 mg total) by mouth daily., Disp: 90 tablet, Rfl: 1 .  gabapentin (NEURONTIN) 300 MG capsule, Take 1 capsule (300 mg total) by mouth 4 (four) times daily., Disp: 360 capsule, Rfl: 1 .  Ipratropium-Albuterol (COMBIVENT  RESPIMAT) 20-100 MCG/ACT AERS respimat, Inhale 1 puff into the lungs every 6 (six) hours as needed for wheezing., Disp: 1 Inhaler, Rfl: 2 .  levothyroxine (SYNTHROID) 137 MCG tablet, Take 1 tablet (137 mcg total) by mouth daily before breakfast., Disp: 90 tablet, Rfl: 1 .  metoprolol succinate (TOPROL-XL) 100 MG 24 hr tablet, Take 1 tablet (100 mg total) by mouth daily., Disp: 90 tablet, Rfl: 1 .  MULTIPLE VITAMINS-MINERALS ER PO, Take 1 tablet by mouth daily., Disp: , Rfl:  .  naftifine (NAFTIN) 1 % cream, APPLY  CREAM TOPICALLY ONCE DAILY, Disp: 90 g, Rfl: 0 .  omega-3 fish oil (MAXEPA) 1000 MG CAPS capsule, Take 1 capsule by mouth daily., Disp: , Rfl:  .  Polyethylene Glycol 3350 POWD, Take 1 Dose by mouth as needed., Disp: , Rfl:  .  potassium chloride SA (K-DUR) 20 MEQ tablet, Take 1 tablet (20 mEq total) by mouth daily., Disp: 90 tablet, Rfl: 1 .  Semaglutide,0.25 or 0.5MG /DOS, (OZEMPIC, 0.25 OR 0.5 MG/DOSE,) 2 MG/1.5ML SOPN, Inject 0.5 mg into the skin once a week., Disp: 2 pen, Rfl: 2  Allergies  Allergen Reactions  . Hydrochlorothiazide     hyperparathyroidism  . Pneumococcal Vaccine Rash  . Pneumovax [Pneumococcal Polysaccharide Vaccine]      I personally reviewed active problem list, medication list, allergies, family history, social history, health maintenance with the patient/caregiver today.   ROS  Constitutional: Negative for fever, positive  weight change.  Respiratory: Positive  for cough but no shortness of breath.   Cardiovascular: Negative for chest pain or palpitations.  Gastrointestinal: Negative for abdominal pain, no bowel changes.  Musculoskeletal: Negative for gait problem or joint swelling.  Skin: Negative for rash.  Neurological: Negative for dizziness or headache.  No other specific complaints in a complete review of systems (except as listed in HPI above).  Objective  Vitals:   07/05/19 0855  BP: 130/60  Pulse: 78  Resp: 16  Temp: 97.8 F (36.6 C)  TempSrc: Temporal  SpO2: 98%  Weight: 266 lb 12.8 oz (121 kg)  Height: 5\' 7"  (1.702 m)    Body mass index is 41.79 kg/m.  Physical Exam  Constitutional: Patient appears well-developed and well-nourished. Obese No distress.  HEENT: head atraumatic, normocephalic, pupils equal and reactive to light, Cardiovascular: Normal rate, regular rhythm and normal heart sounds.  No murmur heard. Trace  BLE edema. Pulmonary/Chest: Effort normal and breath sounds normal. No respiratory distress. Abdominal: Soft.  There is no tenderness. Psychiatric: Patient has a normal mood and affect. behavior is normal. Judgment and thought content normal.  PHQ2/9: Depression screen [facility] 2/9 07/05/2019 04/25/2019 04/25/2019 03/31/2019 12/26/2018  Decreased Interest 0 0 0 0 0  Down, Depressed, Hopeless 0 0 0 0 0  PHQ - 2 Score 0 0 0 0 0  Altered sleeping 0 2 0 0 2  Tired, decreased energy 0 1 0 0 0  Change in appetite 0 0 0 0 0  Feeling bad or failure about yourself  0 0 0 0 0  Trouble concentrating 0 0 0 0 0  Moving slowly or fidgety/restless 0 0 0 0 0  Suicidal thoughts 0 0 0 0 0  PHQ-9 Score 0 3 0 0 2  Difficult doing work/chores - Not difficult at all - - Not  difficult at all  Some recent data might be hidden    phq 9 is negative   Fall Risk: Fall Risk  07/05/2019 04/25/2019 04/25/2019 03/31/2019 12/26/2018  Falls  in the past year? 0 0 0 0 0  Comment - - - - -  Number falls in past yr: 0 0 0 0 0  Injury with Fall? 0 0 0 0 0     Functional Status Survey: Is the patient deaf or have difficulty hearing?: No Does the patient have difficulty seeing, even when wearing glasses/contacts?: No Does the patient have difficulty concentrating, remembering, or making decisions?: No Does the patient have difficulty walking or climbing stairs?: No Does the patient have difficulty dressing or bathing?: No Does the patient have difficulty doing errands alone such as visiting a doctor's office or shopping?: No   Assessment & Plan  1. Essential (primary) hypertension  At goal   2. Type 2 diabetes, controlled, with neuropathy ([place])  - Semaglutide,0.25 or 0.5MG /DOS, (OZEMPIC, 0.25 OR 0.5 MG/DOSE,) 2 MG/1.5ML SOPN; Inject 0.5 mg into the skin once a week.  Dispense: 2 pen; Refill: 2 - Hemoglobin A1c  3. Dyslipidemia associated with type 2 diabetes mellitus (HCC)  - atorvastatin (LIPITOR) 40 MG tablet; Take 1 tablet (40 mg total) by mouth daily.  Dispense: 90 tablet; Refill: 1  4. Simple chronic bronchitis ([place])   5. Hyperparathyroidism ([place])  Keep follow up with Endo   6. Acquired hypothyroidism  - TSH  7. Morbid obesity ([place])  Discussed with the patient the risk posed by an increased BMI. Discussed importance of portion control, calorie counting and at least 150 minutes of physical activity weekly. Avoid sweet beverages and drink more water. Eat at least 6 servings of fruit and vegetables daily   8. Mild concentric left ventricular hypertrophy (LVH)

## 2019-07-06 LAB — TSH: TSH: 0.23 mIU/L — ABNORMAL LOW (ref 0.40–4.50)

## 2019-07-06 LAB — HEMOGLOBIN A1C
Hgb A1c MFr Bld: 5.7 % of total Hgb — ABNORMAL HIGH (ref <5.7)
Mean Plasma Glucose: 117 (calc)
eAG (mmol/L): 6.5 (calc)

## 2019-07-09 ENCOUNTER — Other Ambulatory Visit: Payer: Self-pay | Admitting: Family Medicine

## 2019-07-09 DIAGNOSIS — R600 Localized edema: Secondary | ICD-10-CM

## 2019-07-11 ENCOUNTER — Other Ambulatory Visit: Payer: Self-pay

## 2019-07-11 DIAGNOSIS — E039 Hypothyroidism, unspecified: Secondary | ICD-10-CM

## 2019-07-11 MED ORDER — LEVOTHYROXINE SODIUM 137 MCG PO TABS: ORAL_TABLET | ORAL | 0 refills | Status: DC

## 2019-07-12 ENCOUNTER — Telehealth: Payer: Self-pay | Admitting: Family Medicine

## 2019-07-12 NOTE — Telephone Encounter (Signed)
Robin calling from pharmacy called and stated that medication levothyroxine (SYNTHROID) 137 MCG tablet [id number] Manufacturer will no longer make medication and a new one will need to be sent over. Please advise

## 2019-07-19 DIAGNOSIS — H5213 Myopia, bilateral: Secondary | ICD-10-CM | POA: Diagnosis not present

## 2019-07-31 ENCOUNTER — Other Ambulatory Visit: Payer: Self-pay | Admitting: Family Medicine

## 2019-07-31 DIAGNOSIS — E039 Hypothyroidism, unspecified: Secondary | ICD-10-CM

## 2019-08-01 ENCOUNTER — Ambulatory Visit
Admission: RE | Admit: 2019-08-01 | Discharge: 2019-08-01 | Disposition: A | Discharge status: Home / Self Care | Payer: BC Managed Care – PPO | Source: Ambulatory Visit | Attending: General Surgery | Admitting: General Surgery

## 2019-08-01 DIAGNOSIS — R928 Other abnormal and inconclusive findings on diagnostic imaging of breast: Secondary | ICD-10-CM

## 2019-08-01 DIAGNOSIS — R922 Inconclusive mammogram: Secondary | ICD-10-CM | POA: Diagnosis not present

## 2019-08-14 ENCOUNTER — Encounter: Payer: Self-pay | Admitting: Family Medicine

## 2019-08-23 ENCOUNTER — Encounter: Payer: Self-pay | Admitting: *Deleted

## 2019-10-06 ENCOUNTER — Ambulatory Visit: Payer: BC Managed Care – PPO | Admitting: Family Medicine

## 2019-10-06 ENCOUNTER — Other Ambulatory Visit: Payer: Self-pay

## 2019-10-06 ENCOUNTER — Encounter: Payer: Self-pay | Admitting: Family Medicine

## 2019-10-06 VITALS — BP 122/82 | HR 73 | Temp 97.3°F | Resp 16 | Ht 67.0 in | Wt 268.3 lb

## 2019-10-06 DIAGNOSIS — B353 Tinea pedis: Secondary | ICD-10-CM | POA: Diagnosis not present

## 2019-10-06 DIAGNOSIS — L858 Other specified epidermal thickening: Secondary | ICD-10-CM

## 2019-10-06 DIAGNOSIS — R600 Localized edema: Secondary | ICD-10-CM

## 2019-10-06 DIAGNOSIS — E213 Hyperparathyroidism, unspecified: Secondary | ICD-10-CM

## 2019-10-06 DIAGNOSIS — E785 Hyperlipidemia, unspecified: Secondary | ICD-10-CM

## 2019-10-06 DIAGNOSIS — E1140 Type 2 diabetes mellitus with diabetic neuropathy, unspecified: Secondary | ICD-10-CM | POA: Diagnosis not present

## 2019-10-06 DIAGNOSIS — I517 Cardiomegaly: Secondary | ICD-10-CM

## 2019-10-06 DIAGNOSIS — I10 Essential (primary) hypertension: Secondary | ICD-10-CM

## 2019-10-06 DIAGNOSIS — E1169 Type 2 diabetes mellitus with other specified complication: Secondary | ICD-10-CM

## 2019-10-06 DIAGNOSIS — J410 Simple chronic bronchitis: Secondary | ICD-10-CM

## 2019-10-06 DIAGNOSIS — E039 Hypothyroidism, unspecified: Secondary | ICD-10-CM | POA: Diagnosis not present

## 2019-10-06 LAB — POCT UA - MICROALBUMIN: Microalbumin Ur, POC: 20 mg/L

## 2019-10-06 LAB — POCT GLYCOSYLATED HEMOGLOBIN (HGB A1C): HbA1c, POC (controlled diabetic range): 5.7 % (ref 0.0–7.0)

## 2019-10-06 MED ORDER — GABAPENTIN 300 MG PO CAPS: 300.0000 mg | ORAL_CAPSULE | Freq: Four times a day (QID) | ORAL | 1 refills | Status: DC

## 2019-10-06 MED ORDER — METOPROLOL SUCCINATE ER 100 MG PO TB24: 100.0000 mg | ORAL_TABLET | Freq: Every day | ORAL | 1 refills | Status: DC

## 2019-10-06 MED ORDER — ATORVASTATIN CALCIUM 40 MG PO TABS: 40.0000 mg | ORAL_TABLET | Freq: Every day | ORAL | 1 refills | Status: DC

## 2019-10-06 MED ORDER — AMLODIPINE BESY-BENAZEPRIL HCL 5-40 MG PO CAPS: 1.0000 | ORAL_CAPSULE | Freq: Every day | ORAL | 1 refills | Status: DC

## 2019-10-06 MED ORDER — OZEMPIC (0.25 OR 0.5 MG/DOSE) 2 MG/1.5ML ~~LOC~~ SOPN: 0.5000 mg | PEN_INJECTOR | SUBCUTANEOUS | 2 refills | Status: DC

## 2019-10-06 MED ORDER — TRIAMCINOLONE ACETONIDE 0.1 % EX CREA: 1.0000 "application " | TOPICAL_CREAM | Freq: Two times a day (BID) | CUTANEOUS | 0 refills | Status: DC

## 2019-10-06 NOTE — Progress Notes (Signed)
Name: Charlene Hardy   MRN: [id number]    DOB: November 25, 1955   Date:10/06/2019       Progress Note  Subjective  Chief Complaint  Chief Complaint  Patient presents with  . Medication Refill    3 month F/U  . Hypertension    Slight headache this morning but once she got up it went away  . Diabetes    Checks her BS at home twice weekly  . Morbid Obesity  . Bilateral knee arthralgias  . Dyslipidemia  . Hypothyroidism  . Hyperparathyroidism    HPI  DMII:she has been off Metforminandwas on Trulicity but switched to [facility]  09/2018. Eye exam is up to date. She has peripheral neuropathy , doing better only occasionally has numbness on her legs, continue Gabapentin.She denies polyphagia, but she has polydipsia ( she has hyperparathyroidism )  she also has nocturiathe days she takes lasix a few times a week. Denies  hypoglycemic episodes . Not checking glucose at home lately Eye exam was done Dec 2020   Morbid obesity: with multipleco-morbidities,also bilateral knee pain, she gained a couple of times since last visit, but weight is trending down.   Bilateral knee arthralgias: switch from left to right intermittent knee pain, currently pain on right kneeand intermittent effusionand left foot pain. Sheis offMeloxicam because of kidney function and is taking Tylenol and Gabapentin, she states pain is stable. Pain only with activity. Pain right now is zero/10   Chronic bronchitis: She is still smoking,down to 1pack daily.She denies SOB or wheezing,  but she has a cough most days, usually dry. She uses Combivent prn at most 3 times a week   HTN: she is taking medications as prescribed, bp is at goal. No chest pain, SOBor palpitation. She used to have lower extremity edema and has been taking lasix three times a week. Sheis still not using compression stocking hoses but symptoms are improving BP is at goal .   Dyslipidemia: low HDL, on Atorvastatin and otc fish oil, last HDL  was a little better, LDL a little worse, no recent cramps, eating more bananas   Hypothyroidism:TSH was suppressed on her last visit , we changed to 137 mcg daily and none on Sundays and we will recheck it today. No dysphagia, no change in bowel movements but she has dry skin/worse in winter months  Eczema: she states skin is very dry and bumpy during winter months. Even when she uses lotion   Hyperparathyroidism: seeing Endocrinologist- Dr. Serena Croissant density showed mild osteopenia.Shesates cramps improved.She has a follow up coming up   Patient Active Problem List   Diagnosis Date Noted  . Morbid obesity ([place]) 09/13/2018  . Mild concentric left ventricular hypertrophy (LVH) 05/31/2018  . Carotid atherosclerosis, left 05/31/2018  . Primary hyperparathyroidism ([place]) 02/28/2016  . Arthralgia of both knees 10/23/2015  . Chronic bronchitis ([place]) 10/23/2015  . Osteopenia 10/23/2015  . Left ankle pain 04/25/2015  . Constipation 04/25/2015  . Allergic rhinitis 01/20/2015  . 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/2015  . Diabetic sensorimotor neuropathy ([place]) 01/20/2015  . Dyslipidemia 01/20/2015  . Essential (primary) hypertension 01/20/2015  . H/O iron deficiency anemia 01/20/2015  . Hypertensive retinopathy 01/20/2015  . Adult hypothyroidism 01/20/2015  . Extreme obesity 01/20/2015  . Background retinopathy due to secondary diabetes ([place]) 01/20/2015  . Tinea pedis 01/20/2015  . Vitamin D deficiency 02/26/2010  . Cigarette smoker 03/06/2008    Past Surgical History:  Procedure Laterality Date  . ABDOMINAL HYSTERECTOMY  08/11/1999   still has ovaries  .  BREAST BIOPSY Right 09/05/2018   Affirm Bx- Ribbon clip- path pending  . COLONOSCOPY  08/11/2015  . COLONOSCOPY WITH PROPOFOL N/A 06/10/2016   Procedure: COLONOSCOPY WITH PROPOFOL;  Surgeon: Christene Lye, MD;  Location: [facility] ENDOSCOPY;  Service: Endoscopy;  Laterality: N/A;    Family History  Problem Relation Age of  Onset  . Multiple sclerosis Mother   . Diabetes Father   . Breast cancer Sister 75  . Other Sister        DDD - she had to have back surgery  . Hypertension Maternal Grandmother   . Thyroid disease Paternal Uncle   . Breast cancer Paternal Aunt     Social History   Tobacco Use  . Smoking status: Current Every Day Smoker    Packs/day: 1.00    Years: 41.00    Pack years: 41.00    Types: Cigarettes    Start date: 08/10/1976  . Smokeless tobacco: Never Used  . Tobacco comment: cutting down, smoking half pack lately   Substance Use Topics  . Alcohol use: No    Alcohol/week: 0.0 standard drinks  . Drug use: No     Current Outpatient Medications:  .  acetaminophen (TYLENOL) 500 MG tablet, Take 1 tablet (500 mg total) by mouth 2 (two) times daily., Disp: 30 tablet, Rfl: 0 .  amLODipine-benazepril (LOTREL) 5-40 MG capsule, Take 1 capsule by mouth daily., Disp: 90 capsule, Rfl: 1 .  aspirin 81 MG tablet, Take 1 tablet by mouth daily., Disp: , Rfl:  .  atorvastatin (LIPITOR) 40 MG tablet, Take 1 tablet (40 mg total) by mouth daily., Disp: 90 tablet, Rfl: 1 .  Cholecalciferol (VITAMIN D) 2000 UNITS tablet, Take 1 tablet by mouth daily., Disp: , Rfl:  .  ferrous sulfate 325 (65 FE) MG tablet, Take 1 tablet by mouth daily., Disp: , Rfl:  .  furosemide (LASIX) 20 MG tablet, Take 1 tablet (20 mg total) by mouth daily., Disp: 90 tablet, Rfl: 1 .  gabapentin (NEURONTIN) 300 MG capsule, Take 1 capsule (300 mg total) by mouth 4 (four) times daily., Disp: 360 capsule, Rfl: 1 .  Ipratropium-Albuterol (COMBIVENT RESPIMAT) 20-100 MCG/ACT AERS respimat, Inhale 1 puff into the lungs every 6 (six) hours as needed for wheezing., Disp: 1 Inhaler, Rfl: 2 .  levothyroxine (SYNTHROID) 137 MCG tablet, Take 1 tablet 152mcg daily before breakfast 6 days a week. Do not take any on Sundays., Disp: 90 tablet, Rfl: 0 .  metoprolol succinate (TOPROL-XL) 100 MG 24 hr tablet, Take 1 tablet (100 mg total) by mouth  daily., Disp: 90 tablet, Rfl: 1 .  MULTIPLE VITAMINS-MINERALS ER PO, Take 1 tablet by mouth daily., Disp: , Rfl:  .  naftifine (NAFTIN) 1 % cream, APPLY  CREAM TOPICALLY ONCE DAILY, Disp: 90 g, Rfl: 0 .  omega-3 fish oil (MAXEPA) 1000 MG CAPS capsule, Take 1 capsule by mouth daily., Disp: , Rfl:  .  Polyethylene Glycol 3350 POWD, Take 1 Dose by mouth as needed., Disp: , Rfl:  .  potassium chloride SA (KLOR-CON) 20 MEQ tablet, Take 1 tablet by mouth once daily, Disp: 90 tablet, Rfl: 0 .  Semaglutide,0.25 or 0.5MG /DOS, (OZEMPIC, 0.25 OR 0.5 MG/DOSE,) 2 MG/1.5ML SOPN, Inject 0.5 mg into the skin once a week., Disp: 2 pen, Rfl: 2  Allergies  Allergen Reactions  . Hydrochlorothiazide     hyperparathyroidism  . Pneumococcal Vaccine Rash  . Pneumovax [Pneumococcal Polysaccharide Vaccine]     I personally reviewed active problem list,  medication list, allergies, family history, social history, health maintenance with the patient/caregiver today.   ROS  Constitutional: Negative for fever or weight change.  Respiratory: Negative for cough and shortness of breath.   Cardiovascular: Negative for chest pain or palpitations.  Gastrointestinal: Negative for abdominal pain, no bowel changes.  Musculoskeletal: Negative for gait problem or joint swelling.  Skin: Negative for rash.  Neurological: Negative for dizziness or headache.  No other specific complaints in a complete review of systems (except as listed in HPI above).   Objective  Vitals:   10/06/19 1157  BP: 122/82  Pulse: 73  Resp: 16  Temp: (!) 97.3 F (36.3 C)  TempSrc: Temporal  SpO2: 98%  Weight: 268 lb 4.8 oz (121.7 kg)  Height: 5\' 7"  (1.702 m)    Body mass index is 42.02 kg/m.  Physical Exam  Constitutional: Patient appears well-developed and well-nourished. Obese No distress.  HEENT: head atraumatic, normocephalic, pupils equal and reactive to light Cardiovascular: Normal rate, regular rhythm and normal heart sounds.   No murmur heard. No BLE edema. Pulmonary/Chest: Effort normal and breath sounds normal. No respiratory distress. Abdominal: Soft.  There is no tenderness. Psychiatric: Patient has a normal mood and affect. behavior is normal. Judgment and thought content normal.  Recent Results (from the past 2160 hour(s))  POCT UA - Microalbumin     Status: Normal   Collection Time: 10/06/19 12:01 PM  Result Value Ref Range   Microalbumin Ur, POC 20 mg/L   Creatinine, POC     Albumin/Creatinine Ratio, Urine, POC    POCT HgB A1C     Status: Normal   Collection Time: 10/06/19 12:02 PM  Result Value Ref Range   Hemoglobin A1C     HbA1c POC (<> result, manual entry)     HbA1c, POC (prediabetic range)     HbA1c, POC (controlled diabetic range) 5.7 0.0 - 7.0 %    PHQ2/9: Depression screen [facility] 2/9 10/06/2019 07/05/2019 04/25/2019 04/25/2019 03/31/2019  Decreased Interest 0 0 0 0 0  Down, Depressed, Hopeless 0 0 0 0 0  PHQ - 2 Score 0 0 0 0 0  Altered sleeping 0 0 2 0 0  Tired, decreased energy 0 0 1 0 0  Change in appetite 1 0 0 0 0  Feeling bad or failure about yourself  0 0 0 0 0  Trouble concentrating 0 0 0 0 0  Moving slowly or fidgety/restless 0 0 0 0 0  Suicidal thoughts 0 0 0 0 0  PHQ-9 Score 1 0 3 0 0  Difficult doing work/chores Not difficult at all - Not difficult at all - -  Some recent data might be hidden    phq 9 is negative  Fall Risk: Fall Risk  10/06/2019 07/05/2019 04/25/2019 04/25/2019 03/31/2019  Falls in the past year? 0 0 0 0 0  Comment - - - - -  Number falls in past yr: 0 0 0 0 0  Injury with Fall? 0 0 0 0 0     Functional Status Survey: Is the patient deaf or have difficulty hearing?: No Does the patient have difficulty seeing, even when wearing glasses/contacts?: Yes Does the patient have difficulty concentrating, remembering, or making decisions?: No Does the patient have difficulty walking or climbing stairs?: No Does the patient have difficulty dressing or  bathing?: No Does the patient have difficulty doing errands alone such as visiting a doctor's office or shopping?: No    Assessment & Plan  1. Type 2 diabetes, controlled, with neuropathy (HCC)  - POCT UA - Microalbumin - POCT HgB A1C - Semaglutide,0.25 or 0.5MG /DOS, (OZEMPIC, 0.25 OR 0.5 MG/DOSE,) 2 MG/1.5ML SOPN; Inject 0.5 mg into the skin once a week.  Dispense: 2 pen; Refill: 2 - gabapentin (NEURONTIN) 300 MG capsule; Take 1 capsule (300 mg total) by mouth 4 (four) times daily.  Dispense: 360 capsule; Refill: 1  2. Acquired hypothyroidism  - TSH  3. Keratosis pilaris  - triamcinolone cream (KENALOG) 0.1 %; Apply 1 application topically 2 (two) times daily. Mix with Eucerin or Nivea lotion ( at home  )  Dispense: 453.6 g; Refill: 0  4. Tinea pedis of both feet  She states almost gone   5. Morbid obesity ([place])  Discussed with the patient the risk posed by an increased BMI. Discussed importance of portion control, calorie counting and at least 150 minutes of physical activity weekly. Avoid sweet beverages and drink more water. Eat at least 6 servings of fruit and vegetables daily   6. Dyslipidemia associated with type 2 diabetes mellitus (HCC)  - atorvastatin (LIPITOR) 40 MG tablet; Take 1 tablet (40 mg total) by mouth daily.  Dispense: 90 tablet; Refill: 1  7. Essential (primary) hypertension  - metoprolol succinate (TOPROL-XL) 100 MG 24 hr tablet; Take 1 tablet (100 mg total) by mouth daily.  Dispense: 90 tablet; Refill: 1 - amLODipine-benazepril (LOTREL) 5-40 MG capsule; Take 1 capsule by mouth daily.  Dispense: 90 capsule; Refill: 1  8. Simple chronic bronchitis ([place])   9. Hyperparathyroidism ([place])  Keep follow up with Dr. Honor Junes   10. Mild concentric left ventricular hypertrophy (LVH)   11. Bilateral edema of lower extremity

## 2019-10-07 LAB — TSH: TSH: 3.27 mIU/L (ref 0.40–4.50)

## 2019-10-15 ENCOUNTER — Other Ambulatory Visit: Payer: Self-pay | Admitting: Family Medicine

## 2019-10-15 DIAGNOSIS — R600 Localized edema: Secondary | ICD-10-CM

## 2019-11-01 ENCOUNTER — Other Ambulatory Visit: Payer: Self-pay

## 2019-11-01 DIAGNOSIS — E039 Hypothyroidism, unspecified: Secondary | ICD-10-CM

## 2019-11-01 MED ORDER — LEVOTHYROXINE SODIUM 137 MCG PO TABS: ORAL_TABLET | ORAL | 0 refills | Status: DC

## 2019-11-01 NOTE — Telephone Encounter (Signed)
Refill request for thyroid medication. Levothyroxine to [facility]   Last visit: 10/06/2019   Lab Results  Component Value Date   TSH 3.27 10/06/2019     Follow up on 01/02/2020

## 2019-11-22 DIAGNOSIS — E210 Primary hyperparathyroidism: Secondary | ICD-10-CM | POA: Diagnosis not present

## 2019-11-22 DIAGNOSIS — E039 Hypothyroidism, unspecified: Secondary | ICD-10-CM | POA: Diagnosis not present

## 2019-12-09 ENCOUNTER — Other Ambulatory Visit: Payer: Self-pay | Admitting: Family Medicine

## 2019-12-09 DIAGNOSIS — R600 Localized edema: Secondary | ICD-10-CM

## 2019-12-09 NOTE — Telephone Encounter (Signed)
Requested Prescriptions  Pending Prescriptions Disp Refills  . furosemide (LASIX) 20 MG tablet [Pharmacy Med Name: Furosemide 20 MG Oral Tablet] 90 tablet 1    Sig: Take 1 tablet by mouth once daily     Cardiovascular:  Diuretics - Loop Failed - 12/09/2019 10:28 AM      Failed - Ca in normal range and within 360 days    Calcium  Date Value Ref Range Status  03/31/2019 10.6 (H) 8.6 - 10.4 mg/dL Final   Calcium, Total  Date Value Ref Range Status  08/18/2014 9.7 8.5 - 10.1 mg/dL Final   Calcium, Ion  Date Value Ref Range Status  07/26/2015 5.8 (H) 4.5 - 5.6 mg/dL Final         Passed - K in normal range and within 360 days    Potassium  Date Value Ref Range Status  03/31/2019 4.8 3.5 - 5.3 mmol/L Final  08/18/2014 3.9 3.5 - 5.1 mmol/L Final         Passed - Na in normal range and within 360 days    Sodium  Date Value Ref Range Status  03/31/2019 140 135 - 146 mmol/L Final  04/25/2015 142 134 - 144 mmol/L Final  08/18/2014 133 (L) 136 - 145 mmol/L Final         Passed - Cr in normal range and within 360 days    Creat  Date Value Ref Range Status  03/31/2019 0.80 0.50 - 0.99 mg/dL Final    Comment:    For patients >58 years of age, the reference limit for Creatinine is approximately 13% higher for people identified as African-American. .    Creatinine, Urine  Date Value Ref Range Status  02/24/2018 35 20 - 275 mg/dL Final         Passed - Last BP in normal range    BP Readings from Last 1 Encounters:  10/06/19 122/82         Passed - Valid encounter within last 6 months    Recent Outpatient Visits          2 months ago Type 2 diabetes, controlled, with neuropathy [facility])   [facility] Steele Sizer, MD   5 months ago Essential (primary) hypertension   [facility] Steele Sizer, MD   7 months ago Abnormal mammogram of right breast   [facility], Drue Stager, MD   8 months ago Type 2 diabetes,  controlled, with neuropathy [facility])   [facility] Steele Sizer, MD   11 months ago Type 2 diabetes, controlled, with neuropathy [facility])   [facility] Steele Sizer, MD      Future Appointments            In 3 weeks Ancil Boozer, Drue Stager, MD [facility], [facility]

## 2020-01-02 ENCOUNTER — Ambulatory Visit: Payer: BC Managed Care – PPO | Admitting: Family Medicine

## 2020-01-02 ENCOUNTER — Other Ambulatory Visit: Payer: Self-pay

## 2020-01-02 ENCOUNTER — Encounter: Payer: Self-pay | Admitting: Family Medicine

## 2020-01-02 VITALS — BP 132/78 | HR 69 | Temp 97.9°F | Resp 20 | Ht 67.0 in | Wt 270.0 lb

## 2020-01-02 DIAGNOSIS — I517 Cardiomegaly: Secondary | ICD-10-CM

## 2020-01-02 DIAGNOSIS — E785 Hyperlipidemia, unspecified: Secondary | ICD-10-CM

## 2020-01-02 DIAGNOSIS — E213 Hyperparathyroidism, unspecified: Secondary | ICD-10-CM

## 2020-01-02 DIAGNOSIS — E039 Hypothyroidism, unspecified: Secondary | ICD-10-CM

## 2020-01-02 DIAGNOSIS — R600 Localized edema: Secondary | ICD-10-CM | POA: Diagnosis not present

## 2020-01-02 DIAGNOSIS — E1169 Type 2 diabetes mellitus with other specified complication: Secondary | ICD-10-CM | POA: Diagnosis not present

## 2020-01-02 DIAGNOSIS — E1140 Type 2 diabetes mellitus with diabetic neuropathy, unspecified: Secondary | ICD-10-CM

## 2020-01-02 DIAGNOSIS — R252 Cramp and spasm: Secondary | ICD-10-CM

## 2020-01-02 DIAGNOSIS — J410 Simple chronic bronchitis: Secondary | ICD-10-CM

## 2020-01-02 LAB — POCT GLYCOSYLATED HEMOGLOBIN (HGB A1C): Hemoglobin A1C: 5.9 % — AB (ref 4.0–5.6)

## 2020-01-02 MED ORDER — COMBIVENT RESPIMAT 20-100 MCG/ACT IN AERS: 1.0000 | INHALATION_SPRAY | Freq: Four times a day (QID) | RESPIRATORY_TRACT | 5 refills | Status: DC | PRN

## 2020-01-02 MED ORDER — POTASSIUM CHLORIDE CRYS ER 20 MEQ PO TBCR: 20.0000 meq | EXTENDED_RELEASE_TABLET | Freq: Once | ORAL | 1 refills | Status: DC

## 2020-01-02 MED ORDER — LEVOTHYROXINE SODIUM 137 MCG PO TABS: ORAL_TABLET | ORAL | 0 refills | Status: DC

## 2020-01-02 NOTE — Addendum Note (Signed)
Addended by: Steele Sizer F on: 01/02/2020 04:02 PM   Modules accepted: Orders

## 2020-01-02 NOTE — Progress Notes (Signed)
Name: Charlene Hardy   MRN: [id number]    DOB: [birth year]   Date:01/02/2020       Progress Note  Subjective  Chief Complaint  Chief Complaint  Patient presents with  . Follow-up  . Diabetes  . Hypertension  . Hypothyroidism  . Hyperlipidemia    HPI  DMII:she has been off Metforminandwas on Trulicity but switched to [facility]  09/2018. Eye exam is up to date, she signed a released of medical records. She has peripheral neuropathy , doing better only occasionally has numbness on her legs, continue Gabapentin.She denies polyphagia, but she has polydipsia( likely secondary to  hyperparathyroidism )she also has nocturia, she states she sips on water during the night. Denies  hypoglycemic episodes.   Morbid obesity: with multipleco-morbidities,also bilateral knee pain,weight is up a couple of pounds since last visit.   Bilateral knee arthralgias: switch from left to right intermittent knee pain, currently pain on right kneeand intermittent effusionand left foot pain. Sheis offMeloxicam because of kidney function and is taking Tylenol and Gabapentin,she states pain is stable.Pain only with activity, no pain at rest.   Chronic bronchitis: She is still smoking,down to 1pack daily.She denies SOBor wheezing,but she has a cough most days, usually dry.  she states last week she had cough, sob and wheezing that lasted a couple of days, she did not have an inhaler at home, I will send refill of Combivent today   HTN: she is taking medications as prescribed,bp is at goal.No chest pain, SOBor palpitation. She used to have lower extremity edema , she states of lasix for the past couple of weeks and has not noticed increase in lower extremity swelling but has noticed decrease in leg and feet pain , explained she can take medication prn only for swelling.   Dyslipidemia: low HDL, on Atorvastatin and otc fish oil, last HDL was a little better, LDL a little worse, she would like  to hold off until next visit to repeat labs   Hypothyroidism:TSH was suppressed , we changed to 137 mcg daily and none on Sundays and last level was back to normal  No dysphagia, no change in bowel movements , skin is always dry   Eczema: she states skin is very dry and bumpy during winter months. Even when she uses lotion   Hyperparathyroidism: seeing Endocrinologist- Dr. Serena Croissant density showed mild osteopenia.Shesates cramps improved.She is due for repeat bone density test, and will contact [facility] if unable to schedule I will send an order to [facility]    Patient Active Problem List   Diagnosis Date Noted  . Morbid obesity ([place]) 09/13/2018  . Mild concentric left ventricular hypertrophy (LVH) 05/31/2018  . Carotid atherosclerosis, left 05/31/2018  . Primary hyperparathyroidism ([place]) 02/28/2016  . Arthralgia of both knees 10/23/2015  . Chronic bronchitis ([place]) 10/23/2015  . Osteopenia 10/23/2015  . Left ankle pain 04/25/2015  . Constipation 04/25/2015  . Allergic rhinitis 01/20/2015  . 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/2015  . Diabetic sensorimotor neuropathy ([place]) 01/20/2015  . Dyslipidemia 01/20/2015  . Essential (primary) hypertension 01/20/2015  . H/O iron deficiency anemia 01/20/2015  . Hypertensive retinopathy 01/20/2015  . Adult hypothyroidism 01/20/2015  . Extreme obesity 01/20/2015  . Background retinopathy due to secondary diabetes ([place]) 01/20/2015  . Tinea pedis 01/20/2015  . Vitamin D deficiency 02/26/2010  . Cigarette smoker 03/06/2008    Past Surgical History:  Procedure Laterality Date  . ABDOMINAL HYSTERECTOMY  08/11/1999   still has ovaries  . BREAST BIOPSY Right 09/05/2018  Affirm Bx- Ribbon clip- path pending  . COLONOSCOPY  08/11/2015  . COLONOSCOPY WITH PROPOFOL N/A 06/10/2016   Procedure: COLONOSCOPY WITH PROPOFOL;  Surgeon: Christene Lye, MD;  Location: [facility] ENDOSCOPY;  Service: Endoscopy;  Laterality: N/A;    Family  History  Problem Relation Age of Onset  . Multiple sclerosis Mother   . Diabetes Father   . Breast cancer Sister 43  . Other Sister        DDD - she had to have back surgery  . Hypertension Maternal Grandmother   . Thyroid disease Paternal Uncle   . Breast cancer Paternal Aunt     Social History   Tobacco Use  . Smoking status: Current Every Day Smoker    Packs/day: 1.00    Years: 41.00    Pack years: 41.00    Types: Cigarettes    Start date: 08/10/1976  . Smokeless tobacco: Never Used  . Tobacco comment: cutting down, smoking half pack lately   Substance Use Topics  . Alcohol use: No    Alcohol/week: 0.0 standard drinks     Current Outpatient Medications:  .  acetaminophen (TYLENOL) 500 MG tablet, Take 1 tablet (500 mg total) by mouth 2 (two) times daily., Disp: 30 tablet, Rfl: 0 .  amLODipine-benazepril (LOTREL) 5-40 MG capsule, Take 1 capsule by mouth daily., Disp: 90 capsule, Rfl: 1 .  aspirin 81 MG tablet, Take 1 tablet by mouth daily., Disp: , Rfl:  .  atorvastatin (LIPITOR) 40 MG tablet, Take 1 tablet (40 mg total) by mouth daily., Disp: 90 tablet, Rfl: 1 .  Cholecalciferol (VITAMIN D) 2000 UNITS tablet, Take 1 tablet by mouth daily., Disp: , Rfl:  .  ferrous sulfate 325 (65 FE) MG tablet, Take 1 tablet by mouth daily., Disp: , Rfl:  .  furosemide (LASIX) 20 MG tablet, Take 1 tablet by mouth once daily, Disp: 90 tablet, Rfl: 1 .  gabapentin (NEURONTIN) 300 MG capsule, Take 1 capsule (300 mg total) by mouth 4 (four) times daily., Disp: 360 capsule, Rfl: 1 .  Ipratropium-Albuterol (COMBIVENT RESPIMAT) 20-100 MCG/ACT AERS respimat, Inhale 1 puff into the lungs every 6 (six) hours as needed for wheezing., Disp: 1 Inhaler, Rfl: 2 .  levothyroxine (SYNTHROID) 137 MCG tablet, Take 1 tablet 123mcg daily before breakfast 6 days a week. Do not take any on Sundays., Disp: 90 tablet, Rfl: 0 .  metoprolol succinate (TOPROL-XL) 100 MG 24 hr tablet, Take 1 tablet (100 mg total) by mouth  daily., Disp: 90 tablet, Rfl: 1 .  MULTIPLE VITAMINS-MINERALS ER PO, Take 1 tablet by mouth daily., Disp: , Rfl:  .  naftifine (NAFTIN) 1 % cream, APPLY  CREAM TOPICALLY ONCE DAILY, Disp: 90 g, Rfl: 0 .  omega-3 fish oil (MAXEPA) 1000 MG CAPS capsule, Take 1 capsule by mouth daily., Disp: , Rfl:  .  Polyethylene Glycol 3350 POWD, Take 1 Dose by mouth as needed., Disp: , Rfl:  .  potassium chloride SA (KLOR-CON) 20 MEQ tablet, TAKE 1  BY MOUTH ONCE DAILY, Disp: 90 tablet, Rfl: 0 .  Semaglutide,0.25 or 0.5MG /DOS, (OZEMPIC, 0.25 OR 0.5 MG/DOSE,) 2 MG/1.5ML SOPN, Inject 0.5 mg into the skin once a week., Disp: 2 pen, Rfl: 2 .  triamcinolone cream (KENALOG) 0.1 %, Apply 1 application topically 2 (two) times daily. Mix with Eucerin or Nivea lotion ( at home  ), Disp: 453.6 g, Rfl: 0  Allergies  Allergen Reactions  . Hydrochlorothiazide     hyperparathyroidism  . Pneumococcal  Vaccine Rash  . Pneumovax [Pneumococcal Polysaccharide Vaccine]     I personally reviewed active problem list, medication list, allergies, family history, social history, health maintenance with the patient/caregiver today.   ROS  Constitutional: Negative for fever or weight change.  Respiratory: Negative for cough and shortness of breath.   Cardiovascular: Negative for chest pain or palpitations.  Gastrointestinal: Negative for abdominal pain, no bowel changes.  Musculoskeletal: Negative for gait problem or joint swelling.  Skin: Negative for rash.  Neurological: Negative for dizziness or headache.  No other specific complaints in a complete review of systems (except as listed in HPI above).  Objective  Vitals:   01/02/20 0908  BP: 132/78  Pulse: 69  Resp: 20  Temp: 97.9 F (36.6 C)  TempSrc: Temporal  SpO2: 97%  Weight: 270 lb (122.5 kg)  Height: 5\' 7"  (1.702 m)    Body mass index is 42.29 kg/m.  Physical Exam  Constitutional: Patient appears well-developed and well-nourished. Obese  No distress.   HEENT: head atraumatic, normocephalic, pupils equal and reactive to light,  neck supple, Cardiovascular: Normal rate, regular rhythm and normal heart sounds.  No murmur heard. 1 plus BLE edema. Pulmonary/Chest: Effort normal and breath sounds normal. No respiratory distress. Abdominal: Soft.  There is no tenderness. Psychiatric: Patient has a normal mood and affect. behavior is normal. Judgment and thought content normal.  Recent Results (from the past 2160 hour(s))  POCT UA - Microalbumin     Status: Normal   Collection Time: 10/06/19 12:01 PM  Result Value Ref Range   Microalbumin Ur, POC 20 mg/L   Creatinine, POC     Albumin/Creatinine Ratio, Urine, POC    POCT HgB A1C     Status: Normal   Collection Time: 10/06/19 12:02 PM  Result Value Ref Range   Hemoglobin A1C     HbA1c POC (<> result, manual entry)     HbA1c, POC (prediabetic range)     HbA1c, POC (controlled diabetic range) 5.7 0.0 - 7.0 %  TSH     Status: None   Collection Time: 10/06/19 12:24 PM  Result Value Ref Range   TSH 3.27 0.40 - 4.50 mIU/L     PHQ2/9: Depression screen [facility] 2/9 01/02/2020 10/06/2019 07/05/2019 04/25/2019 04/25/2019  Decreased Interest 0 0 0 0 0  Down, Depressed, Hopeless 0 0 0 0 0  PHQ - 2 Score 0 0 0 0 0  Altered sleeping 0 0 0 2 0  Tired, decreased energy 0 0 0 1 0  Change in appetite 0 1 0 0 0  Feeling bad or failure about yourself  0 0 0 0 0  Trouble concentrating 0 0 0 0 0  Moving slowly or fidgety/restless 0 0 0 0 0  Suicidal thoughts 0 0 0 0 0  PHQ-9 Score 0 1 0 3 0  Difficult doing work/chores Not difficult at all Not difficult at all - Not difficult at all -  Some recent data might be hidden    phq 9 is negative   Fall Risk: Fall Risk  01/02/2020 10/06/2019 07/05/2019 04/25/2019 04/25/2019  Falls in the past year? 0 0 0 0 0  Comment - - - - -  Number falls in past yr: - 0 0 0 0  Injury with Fall? - 0 0 0 0    Functional Status Survey: Is the patient deaf or have difficulty  hearing?: No Does the patient have difficulty seeing, even when wearing glasses/contacts?: No Does the patient  have difficulty concentrating, remembering, or making decisions?: No Does the patient have difficulty walking or climbing stairs?: No Does the patient have difficulty dressing or bathing?: No Does the patient have difficulty doing errands alone such as visiting a doctor's office or shopping?: No    Assessment & Plan  1. Bilateral edema of lower extremity  - potassium chloride SA (KLOR-CON) 20 MEQ tablet; Take 1 tablet (20 mEq total) by mouth once for 1 dose.  Dispense: 90 tablet; Refill: 1  2. Acquired hypothyroidism  - levothyroxine (SYNTHROID) 137 MCG tablet; Take 1 tablet 157mcg daily before breakfast 6 days a week. Do not take any on Sundays.  Dispense: 72 tablet; Refill: 0  3. Type 2 diabetes, controlled, with neuropathy (HCC)  Stable  4. Dyslipidemia associated with type 2 diabetes mellitus ([place])  On statin therapy and Ozempic   5. Simple chronic bronchitis (H[facility])  - Ipratropium-Albuterol (COMBIVENT RESPIMAT) 20-100 MCG/ACT AERS respimat; Inhale 1 puff into the lungs every 6 (six) hours as needed for wheezing.  Dispense: 4 g; Refill: 5  6. Hyperparathyroidism ([place])  Keep follow up with Dr. Honor Junes   7. Mild concentric left ventricular hypertrophy (LVH)   8. Leg cramps  Doing better now  9. Morbid obesity ([place])  Discussed with the patient the risk posed by an increased BMI. Discussed importance of portion control, calorie counting and at least 150 minutes of physical activity weekly. Avoid sweet beverages and drink more water. Eat at least 6 servings of fruit and vegetables daily

## 2020-01-02 NOTE — Addendum Note (Signed)
Addended by: Docia Furl on: 01/02/2020 10:43 AM   Modules accepted: Orders

## 2020-03-05 DIAGNOSIS — Z780 Asymptomatic menopausal state: Secondary | ICD-10-CM | POA: Diagnosis not present

## 2020-03-22 ENCOUNTER — Ambulatory Visit: Payer: BC Managed Care – PPO | Admitting: Family Medicine

## 2020-03-22 ENCOUNTER — Other Ambulatory Visit: Payer: Self-pay

## 2020-03-22 ENCOUNTER — Encounter: Payer: Self-pay | Admitting: Family Medicine

## 2020-03-22 VITALS — BP 130/80 | HR 73 | Temp 97.4°F | Resp 16 | Ht 67.0 in | Wt 271.8 lb

## 2020-03-22 DIAGNOSIS — E1169 Type 2 diabetes mellitus with other specified complication: Secondary | ICD-10-CM

## 2020-03-22 DIAGNOSIS — J410 Simple chronic bronchitis: Secondary | ICD-10-CM

## 2020-03-22 DIAGNOSIS — E213 Hyperparathyroidism, unspecified: Secondary | ICD-10-CM

## 2020-03-22 DIAGNOSIS — E039 Hypothyroidism, unspecified: Secondary | ICD-10-CM

## 2020-03-22 DIAGNOSIS — I6522 Occlusion and stenosis of left carotid artery: Secondary | ICD-10-CM

## 2020-03-22 DIAGNOSIS — E1140 Type 2 diabetes mellitus with diabetic neuropathy, unspecified: Secondary | ICD-10-CM | POA: Diagnosis not present

## 2020-03-22 DIAGNOSIS — I517 Cardiomegaly: Secondary | ICD-10-CM | POA: Diagnosis not present

## 2020-03-22 DIAGNOSIS — I10 Essential (primary) hypertension: Secondary | ICD-10-CM

## 2020-03-22 DIAGNOSIS — E785 Hyperlipidemia, unspecified: Secondary | ICD-10-CM | POA: Diagnosis not present

## 2020-03-22 DIAGNOSIS — R600 Localized edema: Secondary | ICD-10-CM

## 2020-03-22 DIAGNOSIS — I499 Cardiac arrhythmia, unspecified: Secondary | ICD-10-CM

## 2020-03-22 LAB — CBC WITH DIFFERENTIAL/PLATELET
Absolute Monocytes: 740 cells/uL (ref 200–950)
Basophils Absolute: 43 cells/uL (ref 0–200)
Basophils Relative: 0.5 %
Eosinophils Absolute: 323 cells/uL (ref 15–500)
Eosinophils Relative: 3.8 %
HCT: 40.4 % (ref 35.0–45.0)
Hemoglobin: 12.9 g/dL (ref 11.7–15.5)
Lymphs Abs: 2431 cells/uL (ref 850–3900)
MCH: 26.9 pg — ABNORMAL LOW (ref 27.0–33.0)
MCHC: 31.9 g/dL — ABNORMAL LOW (ref 32.0–36.0)
MCV: 84.3 fL (ref 80.0–100.0)
MPV: 11.5 fL (ref 7.5–12.5)
Monocytes Relative: 8.7 %
Neutro Abs: 4964 cells/uL (ref 1500–7800)
Neutrophils Relative %: 58.4 %
Platelets: 260 10*3/uL (ref 140–400)
RBC: 4.79 10*6/uL (ref 3.80–5.10)
RDW: 13.2 % (ref 11.0–15.0)
Total Lymphocyte: 28.6 %
WBC: 8.5 10*3/uL (ref 3.8–10.8)

## 2020-03-22 LAB — COMPLETE METABOLIC PANEL WITH GFR
AG Ratio: 1.6 (calc) (ref 1.0–2.5)
ALT: 9 U/L (ref 6–29)
AST: 9 U/L — ABNORMAL LOW (ref 10–35)
Albumin: 4 g/dL (ref 3.6–5.1)
Alkaline phosphatase (APISO): 95 U/L (ref 37–153)
BUN: 9 mg/dL (ref 7–25)
CO2: 28 mmol/L (ref 20–32)
Calcium: 10.7 mg/dL — ABNORMAL HIGH (ref 8.6–10.4)
Chloride: 107 mmol/L (ref 98–110)
Creat: 0.81 mg/dL (ref 0.50–0.99)
GFR, Est African American: 89 mL/min/{1.73_m2} (ref >=60)
GFR, Est Non African American: 77 mL/min/{1.73_m2} (ref >=60)
Globulin: 2.5 g/dL (calc) (ref 1.9–3.7)
Glucose, Bld: 87 mg/dL (ref 65–99)
Potassium: 4.6 mmol/L (ref 3.5–5.3)
Sodium: 141 mmol/L (ref 135–146)
Total Bilirubin: 0.3 mg/dL (ref 0.2–1.2)
Total Protein: 6.5 g/dL (ref 6.1–8.1)

## 2020-03-22 LAB — POCT GLYCOSYLATED HEMOGLOBIN (HGB A1C): Hemoglobin A1C: 5.7 % — AB (ref 4.0–5.6)

## 2020-03-22 LAB — LIPID PANEL
Cholesterol: 107 mg/dL (ref <200)
HDL: 37 mg/dL — ABNORMAL LOW (ref >=50)
LDL Cholesterol (Calc): 52 mg/dL (calc)
Non-HDL Cholesterol (Calc): 70 mg/dL (calc) (ref <130)
Total CHOL/HDL Ratio: 2.9 (calc) (ref <5.0)
Triglycerides: 96 mg/dL (ref <150)

## 2020-03-22 LAB — TSH: TSH: 1.76 mIU/L (ref 0.40–4.50)

## 2020-03-22 MED ORDER — AMLODIPINE BESY-BENAZEPRIL HCL 5-40 MG PO CAPS: 1.0000 | ORAL_CAPSULE | Freq: Every day | ORAL | 1 refills | Status: DC

## 2020-03-22 MED ORDER — COMBIVENT RESPIMAT 20-100 MCG/ACT IN AERS: 1.0000 | INHALATION_SPRAY | Freq: Four times a day (QID) | RESPIRATORY_TRACT | 5 refills | Status: DC | PRN

## 2020-03-22 MED ORDER — METOPROLOL SUCCINATE ER 100 MG PO TB24: 100.0000 mg | ORAL_TABLET | Freq: Every day | ORAL | 1 refills | Status: DC

## 2020-03-22 MED ORDER — GABAPENTIN 300 MG PO CAPS: 300.0000 mg | ORAL_CAPSULE | Freq: Four times a day (QID) | ORAL | 1 refills | Status: DC

## 2020-03-22 NOTE — Progress Notes (Signed)
Name: Charlene Hardy   MRN: [id number]    DOB: 1956-07-14   Date:03/22/2020       Progress Note  Subjective  Chief Complaint  Chief Complaint  Patient presents with   Follow-up   Diabetes   Hypertension   Hyperlipidemia    HPI  DMII:she has been off Metforminandwas on Trulicitybut switched to Ozempic2/2020.She has peripheral neuropathy , doing better only occasionally has numbness on her legs, continue Gabapentin.She denies polyphagia, but she has polydipsia( likely secondary to  hyperparathyroidism )she also has nocturia, she states she sips on water during the night.Denieshypoglycemic episodes. A1C today is 5.7 % , urine micro is up to date , we will get copy of eye exam  Morbid obesity: with multipleco-morbidities,also bilateral knee pain,weight is stable   Bilateral knee arthralgias: switch from left to right intermittent knee pain, currently pain on right kneeand intermittent effusionand left foot pain. Sheis offMeloxicam because of kidney function and is taking Tylenol and Gabapentin,she states pain is stable.Pain only with activity, no pain at rest.   Chronic bronchitis: She is still smoking,down to 1pack daily.She has SOB with moderate activity and occasional  wheezing,she also has a dry cough. She needs a refill of inhaler   HTN: she is taking medications as prescribed,bp is at goal.No chest painor palpitation ,but has sob with moderate activity . She takes furosemide a few times a week and is controlling lower extremity edema   Dyslipidemia: low HDL, on Atorvastatin and otc fish oil,last HDL was a little better, LDL a little worse, we will recheck labs today   Hypothyroidism:TSH was suppressed , we changed to 137 mcg daily and none on Sundays and last level was back to normal , we will recheck it today  No dysphagia, no change in bowel movements , skin is always dry - worse on legs   Hyperparathyroidism: seeing  Endocrinologist- Dr. Serena Croissant density showed mild osteopenia.Shesates cramps improved. Had a bone density done 03/05/2020 that was normal at [facility]    Patient Active Problem List   Diagnosis Date Noted   Morbid obesity ([place]) 09/13/2018   Mild concentric left ventricular hypertrophy (LVH) 05/31/2018   Carotid atherosclerosis, left 05/31/2018   Primary hyperparathyroidism ([place]) 02/28/2016   Arthralgia of both knees 10/23/2015   Chronic bronchitis ([place]) 10/23/2015   Osteopenia 10/23/2015   Left ankle pain 04/25/2015   Constipation 04/25/2015   Allergic rhinitis 01/20/2015   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/2015   Diabetic sensorimotor neuropathy ([place]) 01/20/2015   Dyslipidemia 01/20/2015   Essential (primary) hypertension 01/20/2015   H/O iron deficiency anemia 01/20/2015   Hypertensive retinopathy 01/20/2015   Adult hypothyroidism 01/20/2015   Extreme obesity 01/20/2015   Background retinopathy due to secondary diabetes ([place]) 01/20/2015   Tinea pedis 01/20/2015   Vitamin D deficiency 02/26/2010   Cigarette smoker 03/06/2008    Past Surgical History:  Procedure Laterality Date   ABDOMINAL HYSTERECTOMY  08/11/1999   still has ovaries   BREAST BIOPSY Right 09/05/2018   Affirm Bx- Ribbon clip- path pending   COLONOSCOPY  08/11/2015   COLONOSCOPY WITH PROPOFOL N/A 06/10/2016   Procedure: COLONOSCOPY WITH PROPOFOL;  Surgeon: Christene Lye, MD;  Location: [facility] ENDOSCOPY;  Service: Endoscopy;  Laterality: N/A;    Family History  Problem Relation Age of Onset   Multiple sclerosis Mother    Diabetes Father    Breast cancer Sister 65   Other Sister        DDD - she had to have back surgery  Hypertension Maternal Grandmother    Thyroid disease Paternal Uncle    Breast cancer Paternal Aunt     Social History   Tobacco Use   Smoking status: Current Every Day Smoker    Packs/day: 1.00    Years: 41.00    Pack years:  41.00    Types: Cigarettes    Start date: 08/10/1976   Smokeless tobacco: Never Used   Tobacco comment: cutting down, smoking half pack lately   Substance Use Topics   Alcohol use: No    Alcohol/week: 0.0 standard drinks     Current Outpatient Medications:    acetaminophen (TYLENOL) 500 MG tablet, Take 1 tablet (500 mg total) by mouth 2 (two) times daily., Disp: 30 tablet, Rfl: 0   amLODipine-benazepril (LOTREL) 5-40 MG capsule, Take 1 capsule by mouth daily., Disp: 90 capsule, Rfl: 1   aspirin 81 MG tablet, Take 1 tablet by mouth daily., Disp: , Rfl:    atorvastatin (LIPITOR) 40 MG tablet, Take 1 tablet (40 mg total) by mouth daily., Disp: 90 tablet, Rfl: 1   Cholecalciferol (VITAMIN D) 2000 UNITS tablet, Take 1 tablet by mouth daily., Disp: , Rfl:    ferrous sulfate 325 (65 FE) MG tablet, Take 1 tablet by mouth daily., Disp: , Rfl:    furosemide (LASIX) 20 MG tablet, Take 1 tablet by mouth once daily, Disp: 90 tablet, Rfl: 1   gabapentin (NEURONTIN) 300 MG capsule, Take 1 capsule (300 mg total) by mouth 4 (four) times daily., Disp: 360 capsule, Rfl: 1   Ipratropium-Albuterol (COMBIVENT RESPIMAT) 20-100 MCG/ACT AERS respimat, Inhale 1 puff into the lungs every 6 (six) hours as needed for wheezing., Disp: 4 g, Rfl: 5   levothyroxine (SYNTHROID) 137 MCG tablet, Take 1 tablet 138mcg daily before breakfast 6 days a week. Do not take any on Sundays., Disp: 72 tablet, Rfl: 0   metoprolol succinate (TOPROL-XL) 100 MG 24 hr tablet, Take 1 tablet (100 mg total) by mouth daily., Disp: 90 tablet, Rfl: 1   MULTIPLE VITAMINS-MINERALS ER PO, Take 1 tablet by mouth daily., Disp: , Rfl:    naftifine (NAFTIN) 1 % cream, APPLY  CREAM TOPICALLY ONCE DAILY, Disp: 90 g, Rfl: 0   omega-3 fish oil (MAXEPA) 1000 MG CAPS capsule, Take 1 capsule by mouth daily., Disp: , Rfl:    Polyethylene Glycol 3350 POWD, Take 1 Dose by mouth as needed., Disp: , Rfl:    potassium chloride SA (KLOR-CON) 20 MEQ  tablet, Take 1 tablet (20 mEq total) by mouth once for 1 dose., Disp: 90 tablet, Rfl: 1   Semaglutide,0.25 or 0.5MG /DOS, (OZEMPIC, 0.25 OR 0.5 MG/DOSE,) 2 MG/1.5ML SOPN, Inject 0.5 mg into the skin once a week., Disp: 2 pen, Rfl: 2   triamcinolone cream (KENALOG) 0.1 %, Apply 1 application topically 2 (two) times daily. Mix with Eucerin or Nivea lotion ( at home  ), Disp: 453.6 g, Rfl: 0  Allergies  Allergen Reactions   Hydrochlorothiazide     hyperparathyroidism   Pneumococcal Vaccine Rash   Pneumovax [Pneumococcal Polysaccharide Vaccine]     I personally reviewed active problem list, medication list, allergies, family history, social history with the patient/caregiver today.   ROS  Constitutional: Negative for fever or weight change.  Respiratory: positive  for cough and shortness of breath with activity    Cardiovascular: Negative for chest pain or palpitations.  Gastrointestinal: Negative for abdominal pain, no bowel changes.  Musculoskeletal: Negative for gait problem or joint swelling.  Skin: Negative for  rash.  Neurological: Negative for dizziness or headache.  No other specific complaints in a complete review of systems (except as listed in HPI above).  Objective   Vitals:   03/22/20 1153  BP: 130/80  Pulse: 73  Resp: 16  Temp: (!) 97.4 F (36.3 C)  TempSrc: Oral  SpO2: 99%  Weight: 271 lb 12.8 oz (123.3 kg)  Height: 5\' 7"  (1.702 m)    Body mass index is 42.57 kg/m.  Physical Exam  Constitutional: Patient appears well-developed and well-nourished. Obese  No distress.  HEENT: head atraumatic, normocephalic, pupils equal and reactive to light,neck supple Cardiovascular: Normal rate, irregular rhythm and 1/6 sem  murmur heard.  Trace  BLE edema. Pulmonary/Chest: Effort normal and breath sounds normal. No respiratory distress. Abdominal: Soft.  There is no tenderness. Skin: dry skin  Psychiatric: Patient has a normal mood and affect. behavior is normal.  Judgment and thought content normal.  Recent Results (from the past 2160 hour(s))  POCT HgB A1C     Status: Abnormal   Collection Time: 01/02/20 10:19 AM  Result Value Ref Range   Hemoglobin A1C 5.9 (A) 4.0 - 5.6 %   HbA1c POC (<> result, manual entry)     HbA1c, POC (prediabetic range)     HbA1c, POC (controlled diabetic range)    POCT HgB A1C     Status: Abnormal   Collection Time: 03/22/20 11:12 AM  Result Value Ref Range   Hemoglobin A1C 5.7 (A) 4.0 - 5.6 %   HbA1c POC (<> result, manual entry)     HbA1c, POC (prediabetic range)     HbA1c, POC (controlled diabetic range)        PHQ2/9: Depression screen [facility] 2/9 03/22/2020 01/02/2020 10/06/2019 07/05/2019 04/25/2019  Decreased Interest 0 0 0 0 0  Down, Depressed, Hopeless 0 0 0 0 0  PHQ - 2 Score 0 0 0 0 0  Altered sleeping 0 0 0 0 2  Tired, decreased energy 0 0 0 0 1  Change in appetite 0 0 1 0 0  Feeling bad or failure about yourself  0 0 0 0 0  Trouble concentrating 0 0 0 0 0  Moving slowly or fidgety/restless 0 0 0 0 0  Suicidal thoughts 0 0 0 0 0  PHQ-9 Score 0 0 1 0 3  Difficult doing work/chores Not difficult at all Not difficult at all Not difficult at all - Not difficult at all  Some recent data might be hidden    phq 9 is negative   Fall Risk: Fall Risk  03/22/2020 01/02/2020 10/06/2019 07/05/2019 04/25/2019  Falls in the past year? 0 1 0 0 0  Comment - - - - -  Number falls in past yr: 0 1 0 0 0  Injury with Fall? 0 0 0 0 0      Assessment & Plan  1. Type 2 diabetes, controlled, with neuropathy (HCC)  - POCT HgB A1C - gabapentin (NEURONTIN) 300 MG capsule; Take 1 capsule (300 mg total) by mouth 4 (four) times daily.  Dispense: 360 capsule; Refill: 1  2. Bilateral edema of lower extremity   3. Dyslipidemia associated with type 2 diabetes mellitus (HCC)  - Lipid panel - COMPLETE METABOLIC PANEL WITH GFR  4. Acquired hypothyroidism  - TSH  5. Simple chronic bronchitis (HCC)  -  Ipratropium-Albuterol (COMBIVENT RESPIMAT) 20-100 MCG/ACT AERS respimat; Inhale 1 puff into the lungs every 6 (six) hours as needed for wheezing.  Dispense: 4 g;  Refill: 5  6. Hyperparathyroidism ([place])  Sees Dr. Manfred Shirts   7. Mild concentric left ventricular hypertrophy (LVH)  - CBC with Differential/Platelet - Ambulatory referral to Cardiology  8. Morbid obesity ([place])  Discussed with the patient the risk posed by an increased BMI. Discussed importance of portion control, calorie counting and at least 150 minutes of physical activity weekly. Avoid sweet beverages and drink more water. Eat at least 6 servings of fruit and vegetables daily    9. Carotid atherosclerosis, left   10. Irregular heart beat  - Ambulatory referral to Cardiology  11. Essential (primary) hypertension  - amLODipine-benazepril (LOTREL) 5-40 MG capsule; Take 1 capsule by mouth daily.  Dispense: 90 capsule; Refill: 1 - metoprolol succinate (TOPROL-XL) 100 MG 24 hr tablet; Take 1 tablet (100 mg total) by mouth daily.  Dispense: 90 tablet; Refill: 1

## 2020-04-24 ENCOUNTER — Other Ambulatory Visit: Payer: Self-pay | Admitting: Family Medicine

## 2020-04-24 DIAGNOSIS — I10 Essential (primary) hypertension: Secondary | ICD-10-CM

## 2020-04-26 ENCOUNTER — Other Ambulatory Visit: Payer: Self-pay | Admitting: Family Medicine

## 2020-04-26 DIAGNOSIS — E039 Hypothyroidism, unspecified: Secondary | ICD-10-CM

## 2020-04-30 ENCOUNTER — Ambulatory Visit: Payer: BC Managed Care – PPO | Admitting: Cardiology

## 2020-04-30 ENCOUNTER — Encounter: Payer: Self-pay | Admitting: Cardiology

## 2020-04-30 ENCOUNTER — Other Ambulatory Visit: Payer: Self-pay

## 2020-04-30 VITALS — BP 124/72 | HR 79 | Ht 67.0 in | Wt 269.5 lb

## 2020-04-30 DIAGNOSIS — I10 Essential (primary) hypertension: Secondary | ICD-10-CM

## 2020-04-30 DIAGNOSIS — F17200 Nicotine dependence, unspecified, uncomplicated: Secondary | ICD-10-CM

## 2020-04-30 DIAGNOSIS — I517 Cardiomegaly: Secondary | ICD-10-CM | POA: Diagnosis not present

## 2020-04-30 DIAGNOSIS — I499 Cardiac arrhythmia, unspecified: Secondary | ICD-10-CM

## 2020-04-30 NOTE — Progress Notes (Signed)
Cardiology Office Note:    Date:  04/30/2020   ID:  Charlene Hardy, DOB 1956/03/21, MRN [id number]  PCP:  Steele Sizer, MD  [facility] Cardiologist:  Kate Sable, MD  [facility] Electrophysiologist:  None   Referring MD: Steele Sizer, MD   Charlene Hardy is a 64 y.o. female who is being seen today for the evaluation of irregular heartbeats and mild LVH at the request of Steele Sizer, MD.   Chief Complaint  Patient presents with  . OTHER    Irregular heart beat. Meds reviewed verbally with pt.    History of Present Illness:    Charlene Hardy is a 64 y.o. female with a hx of hypertension, hyperlipidemia, morbid obesity, current smoker x40+ years who presents due to irregular heartbeat.  Patient saw her primary care physician last month for regular visit.  Her heart rate was noted to be irregular.  She denies any history of palpitations, heart disease.  Had an echocardiogram performed in 2019 due to shortness of breath. echocardiogram on 04/2018 showed normal systolic function, EF 55 to 60%, impaired relaxation, mild MR, mild LVH.  She denies chest pain or shortness of breath at rest or with exertion.  Denies edema, is a current smoker.  She is working on losing weight and quitting smoking.  Takes all her medications as prescribed, has no concerns at this time.  Past Medical History:  Diagnosis Date  . Arthritis   . Diabetes mellitus without complication ([place])   . Heart murmur   . Hyperlipidemia   . Hypertension   . Thyroid disease     Past Surgical History:  Procedure Laterality Date  . ABDOMINAL HYSTERECTOMY  08/11/1999   still has ovaries  . BREAST BIOPSY Right 09/05/2018   Affirm Bx- Ribbon clip- path pending  . COLONOSCOPY  08/11/2015  . COLONOSCOPY WITH PROPOFOL N/A 06/10/2016   Procedure: COLONOSCOPY WITH PROPOFOL;  Surgeon: Christene Lye, MD;  Location: [facility] ENDOSCOPY;  Service: Endoscopy;  Laterality: N/A;    Current  Medications: Current Meds  Medication Sig  . acetaminophen (TYLENOL) 500 MG tablet Take 1 tablet (500 mg total) by mouth 2 (two) times daily.  Marland Kitchen amLODipine-benazepril (LOTREL) 5-40 MG capsule Take 1 capsule by mouth daily.  Marland Kitchen aspirin 81 MG tablet Take 1 tablet by mouth daily.  Marland Kitchen atorvastatin (LIPITOR) 40 MG tablet Take 1 tablet (40 mg total) by mouth daily.  . Cholecalciferol (VITAMIN D) 2000 UNITS tablet Take 1 tablet by mouth daily.  . ferrous sulfate 325 (65 FE) MG tablet Take 1 tablet by mouth daily.  . furosemide (LASIX) 20 MG tablet Take 1 tablet by mouth once daily  . gabapentin (NEURONTIN) 300 MG capsule Take 1 capsule (300 mg total) by mouth 4 (four) times daily.  . Ipratropium-Albuterol (COMBIVENT RESPIMAT) 20-100 MCG/ACT AERS respimat Inhale 1 puff into the lungs every 6 (six) hours as needed for wheezing.  Marland Kitchen levothyroxine (SYNTHROID) 137 MCG tablet TAKE 1 TABLET DAILY BEFORE BREAKFAST 6 DAYS A WEEK, DO NOT TAKE ANY ON SUNDAYS  . metoprolol succinate (TOPROL-XL) 100 MG 24 hr tablet Take 1 tablet (100 mg total) by mouth daily.  . MULTIPLE VITAMINS-MINERALS ER PO Take 1 tablet by mouth daily.  . naftifine (NAFTIN) 1 % cream APPLY  CREAM TOPICALLY ONCE DAILY  . omega-3 fish oil (MAXEPA) 1000 MG CAPS capsule Take 1 capsule by mouth daily.  . Polyethylene Glycol 3350 POWD Take 1 Dose by mouth as needed.  . potassium chloride SA (KLOR-CON)  20 MEQ tablet Take 1 tablet (20 mEq total) by mouth once for 1 dose.  . Semaglutide,0.25 or 0.5MG /DOS, (OZEMPIC, 0.25 OR 0.5 MG/DOSE,) 2 MG/1.5ML SOPN Inject 0.5 mg into the skin once a week.  . triamcinolone cream (KENALOG) 0.1 % Apply 1 application topically 2 (two) times daily. Mix with Eucerin or Nivea lotion ( at home  )     Allergies:   Hydrochlorothiazide, Pneumococcal vaccine, and Pneumovax [pneumococcal polysaccharide vaccine]   Social History   Socioeconomic History  . Marital status: Single    Spouse name: Not on file  . Number of  children: 0  . Years of education: Not on file  . Highest education level: High school graduate  Occupational History  . Not on file  Tobacco Use  . Smoking status: Current Every Day Smoker    Packs/day: 1.00    Years: 41.00    Pack years: 41.00    Types: Cigarettes    Start date: 08/10/1976  . Smokeless tobacco: Never Used  . Tobacco comment: cutting down, smoking half pack lately   Vaping Use  . Vaping Use: Never used  Substance and Sexual Activity  . Alcohol use: No    Alcohol/week: 0.0 standard drinks  . Drug use: No  . Sexual activity: Not Currently  Other Topics Concern  . Not on file  Social History Narrative  . Not on file   Social Determinants of Health   Financial Resource Strain:   . Difficulty of Paying Living Expenses: Not on file  Food Insecurity:   . Worried About Charity fundraiser in the Last Year: Not on file  . Ran Out of Food in the Last Year: Not on file  Transportation Needs:   . Lack of Transportation (Medical): Not on file  . Lack of Transportation (Non-Medical): Not on file  Physical Activity:   . Days of Exercise per Week: Not on file  . Minutes of Exercise per Session: Not on file  Stress:   . Feeling of Stress : Not on file  Social Connections:   . Frequency of Communication with Friends and Family: Not on file  . Frequency of Social Gatherings with Friends and Family: Not on file  . Attends Religious Services: Not on file  . Active Member of Clubs or Organizations: Not on file  . Attends Archivist Meetings: Not on file  . Marital Status: Not on file     Family History: The patient's family history includes Breast cancer in her paternal aunt; Breast cancer (age of onset: 69) in her sister; Diabetes in her father; Heart Problems in her paternal aunt; Hypertension in her maternal grandmother; Multiple sclerosis in her mother; Other in her sister; Thyroid disease in her paternal uncle.  ROS:   Please see the history of present  illness.     All other systems reviewed and are negative.  EKGs/Labs/Other Studies Reviewed:    The following studies were reviewed today:   EKG:  EKG is  ordered today.  The ekg ordered today demonstrates sinus rhythm, occasional PACs.  Recent Labs: 03/22/2020: ALT 9; BUN 9; Creat 0.81; Hemoglobin 12.9; Platelets 260; Potassium 4.6; Sodium 141; TSH 1.76  Recent Lipid Panel    Component Value Date/Time   CHOL 107 03/22/2020 1159   CHOL 150 04/25/2015 0857   TRIG 96 03/22/2020 1159   HDL 37 (L) 03/22/2020 1159   HDL 41 04/25/2015 0857   CHOLHDL 2.9 03/22/2020 1159   VLDL 26 11/02/2016  Tony 03/22/2020 1159    Physical Exam:    VS:  BP 124/72 (BP Location: Right Arm, Patient Position: Sitting, Cuff Size: Large)   Pulse 79   Ht 5\' 7"  (1.702 m)   Wt 269 lb 8 oz (122.2 kg)   SpO2 98%   BMI 42.21 kg/m     Wt Readings from Last 3 Encounters:  04/30/20 269 lb 8 oz (122.2 kg)  03/22/20 271 lb 12.8 oz (123.3 kg)  01/02/20 270 lb (122.5 kg)     GEN:  Well nourished, well developed in no acute distress HEENT: Normal NECK: No JVD; No carotid bruits LYMPHATICS: No lymphadenopathy CARDIAC: Occasional skipped beats, 1/6 systolic murmur. RESPIRATORY:  Clear to auscultation without rales, wheezing or rhonchi  ABDOMEN: Soft, non-tender, non-distended MUSCULOSKELETAL:  No edema; No deformity  SKIN: Warm and dry NEUROLOGIC:  Alert and oriented x 3 PSYCHIATRIC:  Normal affect   ASSESSMENT:    1. Irregular heart beats   2. LVH (left ventricular hypertrophy)   3. Essential hypertension   4. Smoking   5. Morbid obesity ([place])    PLAN:    In order of problems listed above:  1. Patient with occasional skipped beats on exam.  EKG showed occasional PACs likely cause for skipped heartbeats.  Patient denies any symptoms of palpitations.  Etiology for PACs includes smoking, hypoxia secondary to morbid obesity.  Patient on Toprol-XL, continue.  No additional cardiac testing  indicated at this time. 2. Mild LVH noted on last echocardiogram.  Likely secondary to chronic hypertension.  Systolic function was normal, impaired relaxation.  Continue management for hypertension.  Current BP is controlled. 3. History of hypertension, BP controlled.  Continue current medications. 4. Patient is a current smoker x40+ years.  Smoking cessation advised.  Over 5 minutes spent counseling patient. 5. Patient is morbidly obese, low-calorie diet, weight loss advised.  Patient acknowledges her need to lose weight.  Follow-up in 1 year.   Medication Adjustments/Labs and Tests Ordered: Current medicines are reviewed at length with the patient today.  Concerns regarding medicines are outlined above.  Orders Placed This Encounter  Procedures  . EKG 12-Lead   No orders of the defined types were placed in this encounter.   Patient Instructions  Medication Instructions:  Your physician recommends that you continue on your current medications as directed. Please refer to the Current Medication list given to you today.  *If you need a refill on your cardiac medications before your next appointment, please call your pharmacy*   Lab Work: None Ordered If you have labs (blood work) drawn today and your tests are completely normal, you will receive your results only by: Marland Kitchen MyChart Message (if you have MyChart) OR . A paper copy in the mail If you have any lab test that is abnormal or we need to change your treatment, we will call you to review the results.   Testing/Procedures: None Ordered   Follow-Up: At [facility], you and your health needs are our priority.  As part of our continuing mission to provide you with exceptional heart care, we have created designated Provider Care Teams.  These Care Teams include your primary Cardiologist (physician) and Advanced Practice Providers (APPs -  Physician Assistants and Nurse Practitioners) who all work together to provide you with the  care you need, when you need it.  We recommend signing up for the patient portal called "MyChart".  Sign up information is provided on this After Visit  Summary.  MyChart is used to connect with patients for Virtual Visits (Telemedicine).  Patients are able to view lab/test results, encounter notes, upcoming appointments, etc.  Non-urgent messages can be sent to your provider as well.   To learn more about what you can do with MyChart, go to [email or web address].    Your next appointment:   1 year(s)  The format for your next appointment:   In Person  Provider:   Kate Sable, MD   Other Instructions  m    Signed, Kate Sable, MD  04/30/2020 10:09 AM    [place]

## 2020-04-30 NOTE — Patient Instructions (Addendum)
Medication Instructions:  Your physician recommends that you continue on your current medications as directed. Please refer to the Current Medication list given to you today.  *If you need a refill on your cardiac medications before your next appointment, please call your pharmacy*   Lab Work: None Ordered If you have labs (blood work) drawn today and your tests are completely normal, you will receive your results only by: Marland Kitchen MyChart Message (if you have MyChart) OR . A paper copy in the mail If you have any lab test that is abnormal or we need to change your treatment, we will call you to review the results.   Testing/Procedures: None Ordered   Follow-Up: At [facility], you and your health needs are our priority.  As part of our continuing mission to provide you with exceptional heart care, we have created designated Provider Care Teams.  These Care Teams include your primary Cardiologist (physician) and Advanced Practice Providers (APPs -  Physician Assistants and Nurse Practitioners) who all work together to provide you with the care you need, when you need it.  We recommend signing up for the patient portal called "MyChart".  Sign up information is provided on this After Visit Summary.  MyChart is used to connect with patients for Virtual Visits (Telemedicine).  Patients are able to view lab/test results, encounter notes, upcoming appointments, etc.  Non-urgent messages can be sent to your provider as well.   To learn more about what you can do with MyChart, go to [email or web address].    Your next appointment:   1 year(s)  The format for your next appointment:   In Person  Provider:   Kate Sable, MD   Other Instructions  m

## 2020-05-24 ENCOUNTER — Other Ambulatory Visit: Payer: Self-pay | Admitting: Family Medicine

## 2020-05-24 DIAGNOSIS — E039 Hypothyroidism, unspecified: Secondary | ICD-10-CM

## 2020-05-24 NOTE — Telephone Encounter (Signed)
Requested Prescriptions  Pending Prescriptions Disp Refills  . EUTHYROX 137 MCG tablet [Pharmacy Med Name: Euthyrox 137 MCG Oral Tablet] 72 tablet 3    Sig: TAKE 1 TABLET DAILY BEFORE BREAKFAST 6 DAYS A WEEK, DO NOT TAKE ANY ON SUNDAYS     Endocrinology:  Hypothyroid Agents Failed - 05/24/2020 10:43 AM      Failed - TSH needs to be rechecked within 3 months after an abnormal result. Refill until TSH is due.      Passed - TSH in normal range and within 360 days    TSH  Date Value Ref Range Status  03/22/2020 1.76 0.40 - 4.50 mIU/L Final         Passed - Valid encounter within last 12 months    Recent Outpatient Visits          2 months ago Type 2 diabetes, controlled, with neuropathy [facility])   [facility] [place], Drue Stager, MD   4 months ago Type 2 diabetes, controlled, with neuropathy [facility])   [facility] [place], Drue Stager, MD   7 months ago Type 2 diabetes, controlled, with neuropathy [facility])   [facility] Steele Sizer, MD   10 months ago Essential (primary) hypertension   [facility] Steele Sizer, MD   1 year ago Abnormal mammogram of right breast   [facility] Steele Sizer, MD      Future Appointments            In 1 month Ancil Boozer, Drue Stager, MD [facility], [facility]

## 2020-05-28 ENCOUNTER — Other Ambulatory Visit: Payer: Self-pay | Admitting: Family Medicine

## 2020-05-28 DIAGNOSIS — E039 Hypothyroidism, unspecified: Secondary | ICD-10-CM

## 2020-06-24 NOTE — Progress Notes (Signed)
Name: Charlene Hardy   MRN: [id number]    DOB: 03-Jul-1956   Date:06/25/2020       Progress Note  Subjective  Chief Complaint  Follow up  HPI   DMII:she has been off Metforminandwas on Trulicityand has been on Ozempic since 09/2018.She has peripheral neuropathy , doing better on Gabapentin and only occasionally has numbness on her legs.She denies polyphagia, but she has polydipsia( likely secondary to  hyperparathyroidism )she also has nocturia, she states she sips on water during the night, she has tinea pedis and has cream at home, states sometimes forgets to dry the toe webs.Denieshypoglycemic episodes. A1C today is 5.9 %, urine micro is up to date , we still don't have a copy of her eye exam   Morbid obesity: with multipleco-morbidities DM, HTN, hyperlipidemia, also bilateral knee pain that seems worse when weight goes up,weight is trending down   Bilateral knee arthralgias: she states doing well today.  Sheis offMeloxicam because of kidney function and is taking Tylenol and Gabapentin.Pain only with activity, no pain at rest. Currently pain is zero   Chronic bronchitis: She is still smoking,down to 0.5 pack daily .She has SOB with moderate activity she also has dry cough, but only occasionally has wheezing She has been using her inhaler daily   HTN: she is taking medications as prescribed,bp is at goal.No chest painor palpitation ,but has sob with moderate activity . She takes furosemide a few times a week and is controlling lower extremity edema   Dyslipidemia: low HDL, on Atorvastatin and otc fish oil,last LDL was down to 52 we reviewed last labs   Hypothyroidism:TSH was suppressed , we changed to 137 mcg daily and none on Sundays and last level was back to normal  No dysphagia, no change in bowel movements , skin is always dry - worse on legs   Hyperparathyroidism: seeing Endocrinologist- Dr. Serena Croissant density showed mild  osteopenia.Shesates cramps improved. Had a bone density done 03/05/2020 that was [facility] at [facility] , unchanged   Patient Active Problem List   Diagnosis Date Noted  . Morbid obesity ([place]) 09/13/2018  . Mild concentric left ventricular hypertrophy (LVH) 05/31/2018  . Carotid atherosclerosis, left 05/31/2018  . Primary hyperparathyroidism ([place]) 02/28/2016  . Arthralgia of both knees 10/23/2015  . Chronic bronchitis ([place]) 10/23/2015  . Osteopenia 10/23/2015  . Left ankle pain 04/25/2015  . Constipation 04/25/2015  . Allergic rhinitis 01/20/2015  . 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/2015  . Diabetic sensorimotor neuropathy ([place]) 01/20/2015  . Dyslipidemia 01/20/2015  . Essential (primary) hypertension 01/20/2015  . H/O iron deficiency anemia 01/20/2015  . Hypertensive retinopathy 01/20/2015  . Adult hypothyroidism 01/20/2015  . Extreme obesity 01/20/2015  . Background retinopathy due to secondary diabetes ([place]) 01/20/2015  . Tinea pedis 01/20/2015  . Vitamin D deficiency 02/26/2010  . Cigarette smoker 03/06/2008    Past Surgical History:  Procedure Laterality Date  . ABDOMINAL HYSTERECTOMY  08/11/1999   still has ovaries  . BREAST BIOPSY Right 09/05/2018   Affirm Bx- Ribbon clip- path pending  . COLONOSCOPY  08/11/2015  . COLONOSCOPY WITH PROPOFOL N/A 06/10/2016   Procedure: COLONOSCOPY WITH PROPOFOL;  Surgeon: Christene Lye, MD;  Location: [facility] ENDOSCOPY;  Service: Endoscopy;  Laterality: N/A;    Family History  Problem Relation Age of Onset  . Multiple sclerosis Mother   . Diabetes Father   . Breast cancer Sister 16  . Other Sister        DDD - she had to have back surgery  .  Hypertension Maternal Grandmother   . Thyroid disease Paternal Uncle   . Breast cancer Paternal Aunt   . Heart Problems Paternal Aunt     Social History   Tobacco Use  . Smoking status: Current Every Day Smoker    Packs/day: 1.00    Years: 41.00    Pack years: 41.00    Types:  Cigarettes    Start date: 08/10/1976  . Smokeless tobacco: Never Used  . Tobacco comment: cutting down, smoking half pack lately   Substance Use Topics  . Alcohol use: No    Alcohol/week: 0.0 standard drinks     Current Outpatient Medications:  .  acetaminophen (TYLENOL) 500 MG tablet, Take 1 tablet (500 mg total) by mouth 2 (two) times daily., Disp: 30 tablet, Rfl: 0 .  amLODipine-benazepril (LOTREL) 5-40 MG capsule, Take 1 capsule by mouth daily., Disp: 90 capsule, Rfl: 1 .  aspirin 81 MG tablet, Take 1 tablet by mouth daily., Disp: , Rfl:  .  atorvastatin (LIPITOR) 40 MG tablet, Take 1 tablet (40 mg total) by mouth daily., Disp: 90 tablet, Rfl: 1 .  Cholecalciferol (VITAMIN D) 2000 UNITS tablet, Take 1 tablet by mouth daily., Disp: , Rfl:  .  ferrous sulfate 325 (65 FE) MG tablet, Take 1 tablet by mouth daily., Disp: , Rfl:  .  furosemide (LASIX) 20 MG tablet, Take 1 tablet (20 mg total) by mouth daily., Disp: 90 tablet, Rfl: 1 .  gabapentin (NEURONTIN) 300 MG capsule, Take 1 capsule (300 mg total) by mouth 4 (four) times daily., Disp: 360 capsule, Rfl: 1 .  Ipratropium-Albuterol (COMBIVENT RESPIMAT) 20-100 MCG/ACT AERS respimat, Inhale 1 puff into the lungs every 6 (six) hours as needed for wheezing., Disp: 4 g, Rfl: 5 .  levothyroxine (EUTHYROX) 137 MCG tablet, TAKE 1 TABLET DAILY BEFORE BREAKFAST 6 DAYS A WEEK, DO NOT TAKE ANY ON SUNDAYS, Disp: 72 tablet, Rfl: 0 .  metoprolol succinate (TOPROL-XL) 100 MG 24 hr tablet, Take 1 tablet (100 mg total) by mouth daily., Disp: 90 tablet, Rfl: 1 .  MULTIPLE VITAMINS-MINERALS ER PO, Take 1 tablet by mouth daily., Disp: , Rfl:  .  naftifine (NAFTIN) 1 % cream, APPLY  CREAM TOPICALLY ONCE DAILY, Disp: 90 g, Rfl: 0 .  omega-3 fish oil (MAXEPA) 1000 MG CAPS capsule, Take 1 capsule by mouth daily., Disp: , Rfl:  .  Polyethylene Glycol 3350 POWD, Take 1 Dose by mouth as needed., Disp: , Rfl:  .  Semaglutide,0.25 or 0.5MG /DOS, (OZEMPIC, 0.25 OR 0.5  MG/DOSE,) 2 MG/1.5ML SOPN, Inject 0.5 mg into the skin once a week., Disp: 1.5 mL, Rfl: 5 .  triamcinolone cream (KENALOG) 0.1 %, Apply 1 application topically 2 (two) times daily. Mix with Eucerin or Nivea lotion ( at home  ), Disp: 453.6 g, Rfl: 0 .  potassium chloride SA (KLOR-CON) 20 MEQ tablet, Take 1 tablet (20 mEq total) by mouth once for 1 dose., Disp: 90 tablet, Rfl: 1  Allergies  Allergen Reactions  . Hydrochlorothiazide     hyperparathyroidism  . Pneumococcal Vaccine Rash  . Pneumovax [Pneumococcal Polysaccharide Vaccine]     I personally reviewed active problem list, medication list, allergies, family history, social history, health maintenance with the patient/caregiver today.   ROS  Constitutional: Negative for fever or significant weight change.  Respiratory: Negative for cough and shortness of breath.   Cardiovascular: Negative for chest pain or palpitations.  Gastrointestinal: Negative for abdominal pain, no bowel changes.  Musculoskeletal: Negative for gait problem  or joint swelling.  Skin: Negative for rash.  Neurological: Negative for dizziness or headache.  No other specific complaints in a complete review of systems (except as listed in HPI above).  Objective  Vitals:   06/25/20 0957  BP: 120/76  Pulse: (!) 52  Resp: 15  Temp: 98.2 F (36.8 C)  TempSrc: Oral  SpO2: 99%  Weight: 266 lb 12.8 oz (121 kg)  Height: 5\' 7"  (1.702 m)    Body mass index is 41.79 kg/m.  Physical Exam  Constitutional: Patient appears well-developed and well-nourished. Obese  No distress.  HEENT: head atraumatic, normocephalic, pupils equal and reactive to light,  neck supple Cardiovascular: Normal rate, regular rhythm and normal heart sounds.  No murmur heard. left lower extremity edema. Pulmonary/Chest: Effort normal and breath sounds normal. No respiratory distress. Abdominal: Soft.  There is no tenderness. Psychiatric: Patient has a normal mood and affect. behavior is  normal. Judgment and thought content normal.  PHQ2/9: Depression screen [facility] 2/9 06/25/2020 03/22/2020 01/02/2020 10/06/2019 07/05/2019  Decreased Interest 0 0 0 0 0  Down, Depressed, Hopeless 0 0 0 0 0  PHQ - 2 Score 0 0 0 0 0  Altered sleeping 0 0 0 0 0  Tired, decreased energy 0 0 0 0 0  Change in appetite 0 0 0 1 0  Feeling bad or failure about yourself  0 0 0 0 0  Trouble concentrating 0 0 0 0 0  Moving slowly or fidgety/restless 0 0 0 0 0  Suicidal thoughts 0 0 0 0 0  PHQ-9 Score 0 0 0 1 0  Difficult doing work/chores - Not difficult at all Not difficult at all Not difficult at all -  Some recent data might be hidden    phq 9 is negative  Fall Risk: Fall Risk  06/25/2020 03/22/2020 01/02/2020 10/06/2019 07/05/2019  Falls in the past year? 0 0 1 0 0  Comment - - - - -  Number falls in past yr: 0 0 1 0 0  Injury with Fall? 0 0 0 0 0    Functional Status Survey: Is the patient deaf or have difficulty hearing?: No Does the patient have difficulty seeing, even when wearing glasses/contacts?: No Does the patient have difficulty concentrating, remembering, or making decisions?: No Does the patient have difficulty walking or climbing stairs?: No Does the patient have difficulty dressing or bathing?: No Does the patient have difficulty doing errands alone such as visiting a doctor's office or shopping?: No    Assessment & Plan  1. Type 2 diabetes, controlled, with neuropathy (HCC)  - POCT HgB A1C - gabapentin (NEURONTIN) 300 MG capsule; Take 1 capsule (300 mg total) by mouth 4 (four) times daily.  Dispense: 360 capsule; Refill: 1 - Semaglutide,0.25 or 0.5MG /DOS, (OZEMPIC, 0.25 OR 0.5 MG/DOSE,) 2 MG/1.5ML SOPN; Inject 0.5 mg into the skin once a week.  Dispense: 1.5 mL; Refill: 5  2. Simple chronic bronchitis (HCC)  stable  3. Morbid obesity ([place])  Discussed with the patient the risk posed by an increased BMI. Discussed importance of portion control, calorie counting and at  least 150 minutes of physical activity weekly. Avoid sweet beverages and drink more water. Eat at least 6 servings of fruit and vegetables daily   4. Dyslipidemia associated with type 2 diabetes mellitus (HCC)  - atorvastatin (LIPITOR) 40 MG tablet; Take 1 tablet (40 mg total) by mouth daily.  Dispense: 90 tablet; Refill: 1  5. Hyperparathyroidism ([place])  Keep follow up  with Dr. Honor Junes   6. Carotid atherosclerosis, left   7. Mild concentric left ventricular hypertrophy (LVH)   8. Acquired hypothyroidism  - levothyroxine (EUTHYROX) 137 MCG tablet; TAKE 1 TABLET DAILY BEFORE BREAKFAST 6 DAYS A WEEK, DO NOT TAKE ANY ON SUNDAYS  Dispense: 72 tablet; Refill: 0  9. Essential (primary) hypertension  - metoprolol succinate (TOPROL-XL) 100 MG 24 hr tablet; Take 1 tablet (100 mg total) by mouth daily.  Dispense: 90 tablet; Refill: 1  10. Tinea pedis of right foot   11. Edema of left lower leg  - Ambulatory referral to Vascular Surgery  12. Tobacco abuse counseling  Discussed CT lung screen but she would like to hold off for now   13. Bilateral edema of lower extremity  - furosemide (LASIX) 20 MG tablet; Take 1 tablet (20 mg total) by mouth daily.  Dispense: 90 tablet; Refill: 1 - potassium chloride SA (KLOR-CON) 20 MEQ tablet; Take 1 tablet (20 mEq total) by mouth once for 1 dose.  Dispense: 90 tablet; Refill: 1  14. Need for shingles vaccine  - Varicella-zoster vaccine IM

## 2020-06-25 ENCOUNTER — Other Ambulatory Visit: Payer: Self-pay

## 2020-06-25 ENCOUNTER — Encounter: Payer: Self-pay | Admitting: Family Medicine

## 2020-06-25 ENCOUNTER — Ambulatory Visit: Payer: BC Managed Care – PPO | Admitting: Family Medicine

## 2020-06-25 VITALS — BP 120/76 | HR 52 | Temp 98.2°F | Resp 15 | Ht 67.0 in | Wt 266.8 lb

## 2020-06-25 DIAGNOSIS — Z1231 Encounter for screening mammogram for malignant neoplasm of breast: Secondary | ICD-10-CM

## 2020-06-25 DIAGNOSIS — E1169 Type 2 diabetes mellitus with other specified complication: Secondary | ICD-10-CM

## 2020-06-25 DIAGNOSIS — I6522 Occlusion and stenosis of left carotid artery: Secondary | ICD-10-CM

## 2020-06-25 DIAGNOSIS — E1140 Type 2 diabetes mellitus with diabetic neuropathy, unspecified: Secondary | ICD-10-CM

## 2020-06-25 DIAGNOSIS — J410 Simple chronic bronchitis: Secondary | ICD-10-CM

## 2020-06-25 DIAGNOSIS — R600 Localized edema: Secondary | ICD-10-CM

## 2020-06-25 DIAGNOSIS — E785 Hyperlipidemia, unspecified: Secondary | ICD-10-CM

## 2020-06-25 DIAGNOSIS — I10 Essential (primary) hypertension: Secondary | ICD-10-CM

## 2020-06-25 DIAGNOSIS — Z716 Tobacco abuse counseling: Secondary | ICD-10-CM

## 2020-06-25 DIAGNOSIS — E213 Hyperparathyroidism, unspecified: Secondary | ICD-10-CM

## 2020-06-25 DIAGNOSIS — B353 Tinea pedis: Secondary | ICD-10-CM

## 2020-06-25 DIAGNOSIS — E039 Hypothyroidism, unspecified: Secondary | ICD-10-CM

## 2020-06-25 DIAGNOSIS — Z23 Encounter for immunization: Secondary | ICD-10-CM

## 2020-06-25 DIAGNOSIS — I517 Cardiomegaly: Secondary | ICD-10-CM

## 2020-06-25 LAB — POCT GLYCOSYLATED HEMOGLOBIN (HGB A1C): Hemoglobin A1C: 5.9 % — AB (ref 4.0–5.6)

## 2020-06-25 MED ORDER — FUROSEMIDE 20 MG PO TABS: 20.0000 mg | ORAL_TABLET | Freq: Every day | ORAL | 1 refills | Status: DC

## 2020-06-25 MED ORDER — LEVOTHYROXINE SODIUM 137 MCG PO TABS: ORAL_TABLET | ORAL | 0 refills | Status: DC

## 2020-06-25 MED ORDER — GABAPENTIN 300 MG PO CAPS: 300.0000 mg | ORAL_CAPSULE | Freq: Four times a day (QID) | ORAL | 1 refills | Status: DC

## 2020-06-25 MED ORDER — METOPROLOL SUCCINATE ER 100 MG PO TB24: 100.0000 mg | ORAL_TABLET | Freq: Every day | ORAL | 1 refills | Status: DC

## 2020-06-25 MED ORDER — OZEMPIC (0.25 OR 0.5 MG/DOSE) 2 MG/1.5ML ~~LOC~~ SOPN: 0.5000 mg | PEN_INJECTOR | SUBCUTANEOUS | 5 refills | Status: DC

## 2020-06-25 MED ORDER — ATORVASTATIN CALCIUM 40 MG PO TABS: 40.0000 mg | ORAL_TABLET | Freq: Every day | ORAL | 1 refills | Status: DC

## 2020-06-25 MED ORDER — POTASSIUM CHLORIDE CRYS ER 20 MEQ PO TBCR: 20.0000 meq | EXTENDED_RELEASE_TABLET | Freq: Once | ORAL | 1 refills | Status: DC

## 2020-06-25 NOTE — Patient Instructions (Signed)

## 2020-07-03 ENCOUNTER — Other Ambulatory Visit: Payer: Self-pay | Admitting: Family Medicine

## 2020-07-03 DIAGNOSIS — N6489 Other specified disorders of breast: Secondary | ICD-10-CM

## 2020-07-24 DIAGNOSIS — I872 Venous insufficiency (chronic) (peripheral): Secondary | ICD-10-CM | POA: Insufficient documentation

## 2020-07-24 DIAGNOSIS — I890 Lymphedema, not elsewhere classified: Secondary | ICD-10-CM | POA: Insufficient documentation

## 2020-07-24 NOTE — Progress Notes (Signed)
MRN : [id number]  Charlene Hardy is a 64 y.o. (1956/04/21) female who presents with chief complaint of No chief complaint on file. Marland Kitchen  History of Present Illness:  The patient is seen for evaluation of painful lower extremities in association with left leg swelling.  She describes the pain is in her right knee as well as in her right hip radiating down the lateral aspect of her thigh.  She describes her left leg associated with the swelling as "cold and bone chilling".   Patient notes the pain is variable and not always associated with activity.  The pain is somewhat consistent day to day occurring on most days. The patient notes the pain also occurs with standing and routinely seems worse as the day wears on. The pain has been progressive over the past several years. The patient states these symptoms are causing  a profound negative impact on quality of life and daily activities.  The patient denies rest pain or dangling of an extremity off the side of the bed during the night for relief. No open wounds or sores at this time. No history of DVT or phlebitis. No prior interventions or surgeries.  At this time she does note diffuse DJD but has never specifically been worked up for her lumbar sacral spine.  No specific history of back problems and DJD of the lumbar and sacral spine.    No outpatient medications have been marked as taking for the 07/25/20 encounter (Appointment) with Delana Meyer, Dolores Lory, MD.    Past Medical History:  Diagnosis Date  . Arthritis   . Diabetes mellitus without complication ([place])   . Heart murmur   . Hyperlipidemia   . Hypertension   . Thyroid disease     Past Surgical History:  Procedure Laterality Date  . ABDOMINAL HYSTERECTOMY  08/11/1999   still has ovaries  . BREAST BIOPSY Right 09/05/2018   Affirm Bx- Ribbon clip- path pending  . COLONOSCOPY  08/11/2015  . COLONOSCOPY WITH PROPOFOL N/A 06/10/2016   Procedure: COLONOSCOPY WITH PROPOFOL;  Surgeon:  Christene Lye, MD;  Location: [facility] ENDOSCOPY;  Service: Endoscopy;  Laterality: N/A;    Social History Social History   Tobacco Use  . Smoking status: Current Every Day Smoker    Packs/day: 1.00    Years: 41.00    Pack years: 41.00    Types: Cigarettes    Start date: 08/10/1976  . Smokeless tobacco: Never Used  . Tobacco comment: cutting down, smoking half pack lately   Vaping Use  . Vaping Use: Never used  Substance Use Topics  . Alcohol use: No    Alcohol/week: 0.0 standard drinks  . Drug use: No    Family History Family History  Problem Relation Age of Onset  . Multiple sclerosis Mother   . Diabetes Father   . Breast cancer Sister 15  . Other Sister        DDD - she had to have back surgery  . Hypertension Maternal Grandmother   . Thyroid disease Paternal Uncle   . Breast cancer Paternal Aunt   . Heart Problems Paternal Aunt   No family history of bleeding/clotting disorders, porphyria or autoimmune disease   Allergies  Allergen Reactions  . Hydrochlorothiazide     hyperparathyroidism  . Pneumococcal Vaccine Rash  . Pneumovax [Pneumococcal Polysaccharide Vaccine]      REVIEW OF SYSTEMS (Negative unless checked)  Constitutional: [] Weight loss  [] Fever  [] Chills Cardiac: [] Chest pain   [] Chest pressure   []   Palpitations   [] Shortness of breath when laying flat   [] Shortness of breath with exertion. Vascular:  [] Pain in legs with walking   [x] Pain in legs at rest  [] History of DVT   [] Phlebitis   [x] Swelling in legs   [] Varicose veins   [] Non-healing ulcers Pulmonary:   [] Uses home oxygen   [] Productive cough   [] Hemoptysis   [] Wheeze  [] COPD   [] Asthma Neurologic:  [] Dizziness   [] Seizures   [] History of stroke   [] History of TIA  [] Aphasia   [] Vissual changes   [] Weakness or numbness in arm   [] Weakness or numbness in leg Musculoskeletal:   [] Joint swelling   [x] Joint pain   [] Low back pain Hematologic:  [] Easy bruising  [] Easy bleeding    [] Hypercoagulable state   [] Anemic Gastrointestinal:  [] Diarrhea   [] Vomiting  [] Gastroesophageal reflux/heartburn   [] Difficulty swallowing. Genitourinary:  [] Chronic kidney disease   [] Difficult urination  [] Frequent urination   [] Blood in urine Skin:  [] Rashes   [] Ulcers  Psychological:  [] History of anxiety   []  History of major depression.  Physical Examination  There were no vitals filed for this visit. There is no height or weight on file to calculate BMI. Gen: WD/WN, NAD Head: [place]/AT, No temporalis wasting.  Ear/Nose/Throat: Hearing grossly intact, nares w/o erythema or drainage, poor dentition Eyes: PER, EOMI, sclera nonicteric.  Neck: Supple, no masses.  No bruit or JVD.  Pulmonary:  Good air movement, clear to auscultation bilaterally, no use of accessory muscles.  Cardiac: RRR, normal S1, S2, no Murmurs. Vascular: scattered varicosities present bilaterally.  Mild venous stasis changes to the legs bilaterally.  2+ soft pitting edema left leg trace edema of the right Vessel Right Left  Radial Palpable Palpable  PT Palpable Palpable  DP Palpable Palpable  Gastrointestinal: soft, non-distended. No guarding/no peritoneal signs.  Musculoskeletal: M/S 5/5 throughout.  No deformity or atrophy.  Neurologic: CN 2-12 intact. Pain and light touch intact in extremities.  Symmetrical.  Speech is fluent. Motor exam as listed above. Psychiatric: Judgment intact, Mood & affect appropriate for pt's clinical situation. Dermatologic: Mild venous  rashes no ulcers noted.  No changes consistent with cellulitis. Lymph : No lichenification or skin changes of chronic lymphedema.  CBC Lab Results  Component Value Date   WBC 8.5 03/22/2020   HGB 12.9 03/22/2020   HCT 40.4 03/22/2020   MCV 84.3 03/22/2020   PLT 260 03/22/2020    BMET    Component Value Date/Time   NA 141 03/22/2020 1159   NA 142 04/25/2015 0857   NA 133 (L) 08/18/2014 1941   K 4.6 03/22/2020 1159   K 3.9 08/18/2014 1941    CL 107 03/22/2020 1159   CL 100 08/18/2014 1941   CO2 28 03/22/2020 1159   CO2 28 08/18/2014 1941   GLUCOSE 87 03/22/2020 1159   GLUCOSE 109 (H) 08/18/2014 1941   BUN 9 03/22/2020 1159   BUN 10 04/25/2015 0857   BUN 9 08/18/2014 1941   CREATININE 0.81 03/22/2020 1159   CALCIUM 10.7 (H) 03/22/2020 1159   CALCIUM 9.7 08/18/2014 1941   GFRNONAA 77 03/22/2020 1159   GFRAA 89 03/22/2020 1159   CrCl cannot be calculated (Patient's most recent lab result is older than the maximum 21 days allowed.).  COAG No results found for: INR, PROTIME  Radiology No results found.   Assessment/Plan 1. Lymphedema I have had a long discussion with the patient regarding swelling and why it  causes symptoms.  Patient  will begin wearing graduated compression stockings class 1 (20-30 mmHg) on a daily basis a prescription was given. The patient will  beginning wearing the stockings first thing in the morning and removing them in the evening. The patient is instructed specifically not to sleep in the stockings.   In addition, behavioral modification will be initiated.  This will include frequent elevation, use of over the counter pain medications and exercise such as walking.  I have reviewed systemic causes for chronic edema such as liver, kidney and cardiac etiologies.  The patient denies problems with these organ systems.    Consideration for a lymph pump will also be made based upon the effectiveness of conservative therapy.  This would help to improve the edema control and prevent sequela such as ulcers and infections   Patient should undergo duplex ultrasound of the venous system to ensure that DVT or reflux is not present.  The patient will follow-up with me after the ultrasound.   - VAS [place] LOWER EXTREMITY VENOUS (DVT); Future  2. Chronic venous insufficiency I have had a long discussion with the patient regarding swelling and why it  causes symptoms.  Patient will begin wearing graduated  compression stockings class 1 (20-30 mmHg) on a daily basis a prescription was given. The patient will  beginning wearing the stockings first thing in the morning and removing them in the evening. The patient is instructed specifically not to sleep in the stockings.   In addition, behavioral modification will be initiated.  This will include frequent elevation, use of over the counter pain medications and exercise such as walking.  I have reviewed systemic causes for chronic edema such as liver, kidney and cardiac etiologies.  The patient denies problems with these organ systems.    Consideration for a lymph pump will also be made based upon the effectiveness of conservative therapy.  This would help to improve the edema control and prevent sequela such as ulcers and infections   Patient should undergo duplex ultrasound of the venous system to ensure that DVT or reflux is not present.  The patient will follow-up with me after the ultrasound.   - VAS [place] LOWER EXTREMITY VENOUS (DVT); Future  3. Carotid atherosclerosis, left Recommend:  Given the patient's asymptomatic subcritical stenosis no further invasive testing or surgery at this time.  Continue antiplatelet therapy as prescribed Continue management of CAD, HTN and Hyperlipidemia Healthy heart diet,  encouraged exercise at least 4 times per week Follow up in 6 months with duplex ultrasound and physical exam   4. Essential (primary) hypertension Continue antihypertensive medications as already ordered, these medications have been reviewed and there are no changes at this time.   5. Dyslipidemia Continue statin as ordered and reviewed, no changes at this time     Hortencia Pilar, MD  07/24/2020 11:25 AM

## 2020-07-25 ENCOUNTER — Other Ambulatory Visit: Payer: Self-pay

## 2020-07-25 ENCOUNTER — Encounter (INDEPENDENT_AMBULATORY_CARE_PROVIDER_SITE_OTHER): Payer: Self-pay | Admitting: Vascular Surgery

## 2020-07-25 ENCOUNTER — Ambulatory Visit (INDEPENDENT_AMBULATORY_CARE_PROVIDER_SITE_OTHER): Payer: BC Managed Care – PPO | Admitting: Vascular Surgery

## 2020-07-25 ENCOUNTER — Other Ambulatory Visit: Payer: Self-pay | Admitting: Family Medicine

## 2020-07-25 VITALS — BP 147/85 | HR 79 | Resp 16 | Ht 67.0 in | Wt 265.0 lb

## 2020-07-25 DIAGNOSIS — I872 Venous insufficiency (chronic) (peripheral): Secondary | ICD-10-CM | POA: Diagnosis not present

## 2020-07-25 DIAGNOSIS — I6522 Occlusion and stenosis of left carotid artery: Secondary | ICD-10-CM

## 2020-07-25 DIAGNOSIS — I890 Lymphedema, not elsewhere classified: Secondary | ICD-10-CM

## 2020-07-25 DIAGNOSIS — I10 Essential (primary) hypertension: Secondary | ICD-10-CM

## 2020-07-25 DIAGNOSIS — E1169 Type 2 diabetes mellitus with other specified complication: Secondary | ICD-10-CM

## 2020-07-25 DIAGNOSIS — E785 Hyperlipidemia, unspecified: Secondary | ICD-10-CM

## 2020-07-25 DIAGNOSIS — E039 Hypothyroidism, unspecified: Secondary | ICD-10-CM

## 2020-08-05 ENCOUNTER — Ambulatory Visit
Admission: RE | Admit: 2020-08-05 | Discharge: 2020-08-05 | Disposition: A | Discharge status: Home / Self Care | Payer: BC Managed Care – PPO | Source: Ambulatory Visit | Attending: Family Medicine | Admitting: Family Medicine

## 2020-08-05 ENCOUNTER — Other Ambulatory Visit: Payer: Self-pay

## 2020-08-05 DIAGNOSIS — N6489 Other specified disorders of breast: Secondary | ICD-10-CM | POA: Insufficient documentation

## 2020-08-05 DIAGNOSIS — R928 Other abnormal and inconclusive findings on diagnostic imaging of breast: Secondary | ICD-10-CM | POA: Diagnosis not present

## 2020-09-03 LAB — BASIC METABOLIC PANEL
Creatinine: 0.7 (ref <=1.1)
Glucose: 120
Potassium: 4.3 (ref 3.4–5.3)
Sodium: 140 (ref 137–147)

## 2020-09-03 LAB — TSH: TSH: 2 (ref <=5.90)

## 2020-09-03 LAB — CBC AND DIFFERENTIAL
HCT: 39 (ref 36–46)
Hemoglobin: 12.6 (ref 12.0–16.0)
Platelets: 212 (ref 150–399)
WBC: 8.5

## 2020-09-03 LAB — LIPID PANEL
Cholesterol: 97 (ref 0–200)
HDL: 33 — AB (ref 35–70)
LDL Cholesterol: 45
Triglycerides: 105 (ref 40–160)

## 2020-09-03 LAB — CBC: RBC: 4.71 (ref 3.87–5.11)

## 2020-10-15 NOTE — Progress Notes (Signed)
Name: Charlene Hardy   MRN: [id number]    DOB: September 07, 1955   Date:10/16/2020       Progress Note  Subjective  Chief Complaint  Annual Exam  HPI  Patient presents for annual CPE and follow up.  DMII:she started Ozempi back in 09/2018 ( previously on Trulicity and Metformin) .She has peripheral neuropathy , doing better on Gabapentin and only occasionally has numbness on her legs.She denies polyphagia, but she has polydipsia( likely secondary to  hyperparathyroidism )she also has nocturia, she states she sips on water during the night, she also has associated obesity and HTN. She takes ARB and states even though A1C has been controlled does not want to stop Ozempic because it curbs her appetite. Denieshypoglycemic episodes. A1C done today was normal.   Morbid obesity: with multipleco-morbidities DM, HTN, hyperlipidemia, also bilateral knee pain that seems worse when weight goes up,weight is trending down   Bilateral knee arthralgias: she states doing well today.  Sheis offMeloxicam because of kidney function and is taking Tylenol and Gabapentin.Pain usually worse when she works long hours, also feels stiff after rest   Chronic bronchitis: She is still smokingShe has SOB with moderate activity she also has dry cough, very seldom has wheezing. She has been using her inhaler daily Combivent, advised to switch to Trelegy to see if symptoms will improve, also will add ventolin prn. She has smoked for over 20 years and used to be a heavy smoker, some periods ore than one pack per day, discussed lung cancer screen   HTN: she is taking medications as prescribed,bp is at goal.No chest painor palpitation, ,but has sob with moderate activity . She takes furosemide a few times a week and is controlling lower extremity edema BP is at goal   Dyslipidemia: low HDL, on Atorvastatin and otc fish oil,last LDL was down to 45, HDL was low at 33 , advised to eat more fish and tree nuts    Hypothyroidism:TSH last level was at goal, she is on levothyroxine 137 mcg daily and none on Sundays No dysphagia, no change in bowel movements , skin is always dry - worse on legs   Hyperparathyroidism: seeing Endocrinologist- Dr. Serena Croissant density showed mild osteopenia.Shesates cramps improved. Had a bone density done 03/05/2020 that was normal at [facility] ,last calcium level at 10.4   Diet: balanced, eats  Exercise: she has a regular physical active   [place] Visit from 03/22/2020 in [facility]  AUDIT-C Score 0     Depression: Phq 9 is  negative Depression screen [facility] 2/9 10/16/2020 06/25/2020 03/22/2020 01/02/2020 10/06/2019  Decreased Interest 0 0 0 0 0  Down, Depressed, Hopeless 0 0 0 0 0  PHQ - 2 Score 0 0 0 0 0  Altered sleeping 0 0 0 0 0  Tired, decreased energy 0 0 0 0 0  Change in appetite 0 0 0 0 1  Feeling bad or failure about yourself  0 0 0 0 0  Trouble concentrating 0 0 0 0 0  Moving slowly or fidgety/restless 0 0 0 0 0  Suicidal thoughts 0 0 0 0 0  PHQ-9 Score 0 0 0 0 1  Difficult doing work/chores - - Not difficult at all Not difficult at all Not difficult at all  Some recent data might be hidden   Hypertension: BP Readings from Last 3 Encounters:  10/16/20 132/84  07/25/20 (!) 147/85  06/25/20 120/76   Obesity: Wt Readings from Last 3 Encounters:  10/16/20 261 lb (118.4 kg)  07/25/20 265 lb (120.2 kg)  06/25/20 266 lb 12.8 oz (121 kg)   BMI Readings from Last 3 Encounters:  10/16/20 40.88 kg/m  07/25/20 41.50 kg/m  06/25/20 41.79 kg/m     Vaccines:   Shingrix:second dose today  Pneumonia: up to date  Flu: 06/05/2020 educated and discussed with patient.  Hep C Screening: 04/05/2012 STD testing and prevention (HIV/chl/gon/syphilis): 04/25/2015 Intimate partner violence: negative  Sexual History : not sexually active  Menstrual History/LMP/Abnormal Bleeding: s/p hysterectomy  Incontinence  Symptoms: mild symptoms usually present when she has a cold   Breast cancer:  - Last Mammogram: 08/05/2020 - BRCA gene screening: N/A  Osteoporosis: Discussed high calcium and vitamin D supplementation, weight bearing exercises  Cervical cancer screening: N/A  Skin cancer: Discussed monitoring for atypical lesions  Colorectal cancer: 06/10/2016  Lung cancer:   Low Dose CT Chest recommended if Age 66-80 years, 20 pack-year currently smoking OR have quit w/in 15years. Patient does not qualify.   ECG: 04/2020  Advanced Care Planning: A voluntary discussion about advance care planning including the explanation and discussion of advance directives.  Discussed health care proxy and Living will, and the patient was able to identify a health care proxy as her sister Julieanne Cotton   Lipids: Lab Results  Component Value Date   CHOL 107 03/22/2020   CHOL 142 03/31/2019   CHOL 125 02/24/2018   Lab Results  Component Value Date   HDL 37 (L) 03/22/2020   HDL 41 (L) 03/31/2019   HDL 39 (L) 02/24/2018   Lab Results  Component Value Date   LDLCALC 52 03/22/2020   LDLCALC 81 03/31/2019   LDLCALC 65 02/24/2018   Lab Results  Component Value Date   TRIG 96 03/22/2020   TRIG 102 03/31/2019   TRIG 125 02/24/2018   Lab Results  Component Value Date   CHOLHDL 2.9 03/22/2020   CHOLHDL 3.5 03/31/2019   CHOLHDL 3.2 02/24/2018   No results found for: LDLDIRECT  Glucose: Glucose  Date Value Ref Range Status  08/18/2014 109 (H) 65 - 99 mg/dL Final   Glucose, Bld  Date Value Ref Range Status  03/22/2020 87 65 - 99 mg/dL Final    Comment:    .            Fasting reference interval .   03/31/2019 93 65 - 99 mg/dL Final    Comment:    .            Fasting reference interval .   05/31/2018 89 65 - 99 mg/dL Final    Comment:    .            Fasting reference interval .    Glucose-Capillary  Date Value Ref Range Status  06/10/2016 85 65 - 99 mg/dL Final    Patient Active  Problem List   Diagnosis Date Noted  . Lymphedema 07/24/2020  . Chronic venous insufficiency 07/24/2020  . Morbid obesity ([place]) 09/13/2018  . Mild concentric left ventricular hypertrophy (LVH) 05/31/2018  . Carotid atherosclerosis, left 05/31/2018  . Primary hyperparathyroidism ([place]) 02/28/2016  . Arthralgia of both knees 10/23/2015  . Chronic bronchitis ([place]) 10/23/2015  . Osteopenia 10/23/2015  . Left ankle pain 04/25/2015  . Constipation 04/25/2015  . Allergic rhinitis 01/20/2015  . 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/2015  . Diabetic sensorimotor neuropathy ([place]) 01/20/2015  . Dyslipidemia 01/20/2015  . Essential (primary) hypertension 01/20/2015  . H/O iron deficiency anemia 01/20/2015  .  Hypertensive retinopathy 01/20/2015  . Adult hypothyroidism 01/20/2015  . Extreme obesity 01/20/2015  . Background retinopathy due to secondary diabetes ([place]) 01/20/2015  . Tinea pedis 01/20/2015  . Vitamin D deficiency 02/26/2010  . Cigarette smoker 03/06/2008    Past Surgical History:  Procedure Laterality Date  . ABDOMINAL HYSTERECTOMY  08/11/1999   still has ovaries  . BREAST BIOPSY Right 09/05/2018   Affirm Bx- Ribbon clip- path pending  . COLONOSCOPY  08/11/2015  . COLONOSCOPY WITH PROPOFOL N/A 06/10/2016   Procedure: COLONOSCOPY WITH PROPOFOL;  Surgeon: Christene Lye, MD;  Location: [facility] ENDOSCOPY;  Service: Endoscopy;  Laterality: N/A;    Family History  Problem Relation Age of Onset  . Multiple sclerosis Mother   . Diabetes Father   . Breast cancer Sister 26  . Other Sister        DDD - she had to have back surgery  . Hypertension Maternal Grandmother   . Thyroid disease Paternal Uncle   . Breast cancer Paternal Aunt   . Heart Problems Paternal Aunt     Social History   Socioeconomic History  . Marital status: Single    Spouse name: Not on file  . Number of children: 0  . Years of education: Not on file  . Highest education level: High school graduate   Occupational History  . Not on file  Tobacco Use  . Smoking status: Current Every Day Smoker    Packs/day: 1.00    Years: 41.00    Pack years: 41.00    Types: Cigarettes    Start date: 08/10/1976  . Smokeless tobacco: Never Used  . Tobacco comment: cutting down, smoking half pack lately   Vaping Use  . Vaping Use: Never used  Substance and Sexual Activity  . Alcohol use: No    Alcohol/week: 0.0 standard drinks  . Drug use: No  . Sexual activity: Not Currently  Other Topics Concern  . Not on file  Social History Narrative  . Not on file   Social Determinants of Health   Financial Resource Strain: Low Risk   . Difficulty of Paying Living Expenses: Not hard at all  Food Insecurity: No Food Insecurity  . Worried About Charity fundraiser in the Last Year: Never true  . Ran Out of Food in the Last Year: Never true  Transportation Needs: No Transportation Needs  . Lack of Transportation (Medical): No  . Lack of Transportation (Non-Medical): No  Physical Activity: Insufficiently Active  . Days of Exercise per Week: 4 days  . Minutes of Exercise per Session: 30 min  Stress: No Stress Concern Present  . Feeling of Stress : Not at all  Social Connections: Socially Isolated  . Frequency of Communication with Friends and Family: More than three times a week  . Frequency of Social Gatherings with Friends and Family: Once a week  . Attends Religious Services: Never  . Active Member of Clubs or Organizations: No  . Attends Archivist Meetings: Never  . Marital Status: Never married  Intimate Partner Violence: Not At Risk  . Fear of Current or Ex-Partner: No  . Emotionally Abused: No  . Physically Abused: No  . Sexually Abused: No     Current Outpatient Medications:  .  acetaminophen (TYLENOL) 500 MG tablet, Take 1 tablet (500 mg total) by mouth 2 (two) times daily., Disp: 30 tablet, Rfl: 0 .  aspirin 81 MG tablet, Take 1 tablet by mouth daily., Disp: , Rfl:  .  atorvastatin (LIPITOR) 40 MG tablet, Take 1 tablet (40 mg total) by mouth daily., Disp: 90 tablet, Rfl: 1 .  Cholecalciferol (VITAMIN D) 2000 UNITS tablet, Take 1 tablet by mouth daily., Disp: , Rfl:  .  ferrous sulfate 325 (65 FE) MG tablet, Take 1 tablet by mouth daily., Disp: , Rfl:  .  gabapentin (NEURONTIN) 300 MG capsule, Take 1 capsule (300 mg total) by mouth 4 (four) times daily., Disp: 360 capsule, Rfl: 1 .  Ipratropium-Albuterol (COMBIVENT RESPIMAT) 20-100 MCG/ACT AERS respimat, Inhale 1 puff into the lungs every 6 (six) hours as needed for wheezing., Disp: 4 g, Rfl: 5 .  MULTIPLE VITAMINS-MINERALS ER PO, Take 1 tablet by mouth daily., Disp: , Rfl:  .  naftifine (NAFTIN) 1 % cream, APPLY  CREAM TOPICALLY ONCE DAILY, Disp: 90 g, Rfl: 0 .  omega-3 fish oil (MAXEPA) 1000 MG CAPS capsule, Take 1 capsule by mouth daily., Disp: , Rfl:  .  Polyethylene Glycol 3350 POWD, Take 1 Dose by mouth as needed., Disp: , Rfl:  .  triamcinolone cream (KENALOG) 0.1 %, Apply 1 application topically 2 (two) times daily. Mix with Eucerin or Nivea lotion ( at home  ), Disp: 453.6 g, Rfl: 0 .  amLODipine-benazepril (LOTREL) 5-40 MG capsule, Take 1 capsule by mouth daily., Disp: 90 capsule, Rfl: 1 .  furosemide (LASIX) 20 MG tablet, Take 1 tablet (20 mg total) by mouth daily., Disp: 90 tablet, Rfl: 1 .  levothyroxine (EUTHYROX) 137 MCG tablet, TAKE 1 TABLET DAILY BEFORE BREAKFAST 6 DAYS A WEEK, DO NOT TAKE ANY ON SUNDAYS, Disp: 72 tablet, Rfl: 0 .  metoprolol succinate (TOPROL-XL) 100 MG 24 hr tablet, Take 1 tablet (100 mg total) by mouth daily., Disp: 90 tablet, Rfl: 1 .  potassium chloride SA (KLOR-CON) 20 MEQ tablet, Take 1 tablet (20 mEq total) by mouth once for 1 dose., Disp: 90 tablet, Rfl: 1 .  Semaglutide,0.25 or 0.5MG/DOS, (OZEMPIC, 0.25 OR 0.5 MG/DOSE,) 2 MG/1.5ML SOPN, Inject 0.5 mg into the skin once a week., Disp: 1.5 mL, Rfl: 5  Allergies  Allergen Reactions  . Hydrochlorothiazide      hyperparathyroidism  . Pneumococcal Vaccine Rash  . Pneumovax [Pneumococcal Polysaccharide Vaccine]      ROS  Constitutional: Negative for fever, positive for  weight change.  Respiratory: Negative for cough and shortness of breath.   Cardiovascular: Negative for chest pain or palpitations.  Gastrointestinal: Negative for abdominal pain, no bowel changes.  Musculoskeletal: positive  for gait problem intermittently but no  joint swelling.  Skin: Negative for rash.  Neurological: Negative for dizziness or headache.  No other specific complaints in a complete review of systems (except as listed in HPI above).  Objective  Vitals:   10/16/20 0842  BP: 132/84  Pulse: 76  Resp: 16  Temp: 97.9 F (36.6 C)  TempSrc: Oral  SpO2: 97%  Weight: 261 lb (118.4 kg)  Height: '5\' 7"'  (1.702 m)    Body mass index is 40.88 kg/m.  Physical Exam  Constitutional: Patient appears well-developed and well-nourished. No distress.  HENT: Head: Normocephalic and atraumatic. Ears: B TMs ok, no erythema or effusion; Nose: Not done . Mouth/Throat: not done  Eyes: Conjunctivae and EOM are normal. Pupils are equal, round, and reactive to light. No scleral icterus.  Neck: Normal range of motion. Neck supple. No JVD present. No thyromegaly present.  Cardiovascular: Normal rate, regular rhythm and normal heart sounds.  No murmur heard. Trace  BLE edema. Pulmonary/Chest: Effort normal  and breath sounds normal. No respiratory distress. Abdominal: Soft. Bowel sounds are normal, no distension. There is no tenderness. no masses Breast: no lumps or masses, no nipple discharge or rashes FEMALE GENITALIA: s/p hysterectomy  RECTAL:not done  Musculoskeletal: Normal range of motion, no joint effusions. No gross deformities Neurological: he is alert and oriented to person, place, and time. No cranial nerve deficit. Coordination, balance, strength, speech and gait are normal.  Skin: Skin is warm and dry. No rash noted.  No erythema.  Psychiatric: Patient has a normal mood and affect. behavior is normal. Judgment and thought content normal.  Recent Results (from the past 2160 hour(s))  POCT HgB A1C     Status: Normal   Collection Time: 10/16/20  8:49 AM  Result Value Ref Range   Hemoglobin A1C 5.6 4.0 - 5.6 %   HbA1c POC (<> result, manual entry)     HbA1c, POC (prediabetic range)     HbA1c, POC (controlled diabetic range)      Fall Risk: Fall Risk  10/16/2020 06/25/2020 03/22/2020 01/02/2020 10/06/2019  Falls in the past year? 0 0 0 1 0  Comment - - - - -  Number falls in past yr: 0 0 0 1 0  Injury with Fall? 0 0 0 0 0     Functional Status Survey: Is the patient deaf or have difficulty hearing?: No Does the patient have difficulty seeing, even when wearing glasses/contacts?: No Does the patient have difficulty concentrating, remembering, or making decisions?: No Does the patient have difficulty walking or climbing stairs?: No Does the patient have difficulty dressing or bathing?: No Does the patient have difficulty doing errands alone such as visiting a doctor's office or shopping?: No   Assessment & Plan  1. Type 2 diabetes, controlled, with neuropathy (HCC)  - POCT HgB A1C - Semaglutide,0.25 or 0.5MG/DOS, (OZEMPIC, 0.25 OR 0.5 MG/DOSE,) 2 MG/1.5ML SOPN; Inject 0.5 mg into the skin once a week.  Dispense: 1.5 mL; Refill: 5  2. Need for shingles vaccine  - Varicella-zoster vaccine IM  3. Dyslipidemia associated with type 2 diabetes mellitus ([place])   4. Mild concentric left ventricular hypertrophy (LVH)   5. Morbid obesity ([place])  Discussed with the patient the risk posed by an increased BMI. Discussed importance of portion control, calorie counting and at least 150 minutes of physical activity weekly. Avoid sweet beverages and drink more water. Eat at least 6 servings of fruit and vegetables daily   6. Carotid atherosclerosis, left   7. Hyperparathyroidism ([place])   8. Acquired  hypothyroidism  - levothyroxine (EUTHYROX) 137 MCG tablet; TAKE 1 TABLET DAILY BEFORE BREAKFAST 6 DAYS A WEEK, DO NOT TAKE ANY ON SUNDAYS  Dispense: 72 tablet; Refill: 0  9. Essential (primary) hypertension  - amLODipine-benazepril (LOTREL) 5-40 MG capsule; Take 1 capsule by mouth daily.  Dispense: 90 capsule; Refill: 1 - metoprolol succinate (TOPROL-XL) 100 MG 24 hr tablet; Take 1 tablet (100 mg total) by mouth daily.  Dispense: 90 tablet; Refill: 1  10. Tobacco abuse counseling  - CT CHEST LUNG CA SCREEN LOW DOSE W/O CM; Future  11. Well adult exam   12. Bilateral edema of lower extremity  - furosemide (LASIX) 20 MG tablet; Take 1 tablet (20 mg total) by mouth daily.  Dispense: 90 tablet; Refill: 1  13. Venous insufficiency of both lower extremities   14. Lymphedema  Seeing Dr. Erven Colla   15. Simple chronic bronchitis ([place])  - Fluticasone-Umeclidin-Vilant (TRELEGY ELLIPTA) 100-62.5-25 MCG/INH AEPB; Inhale  1 puff into the lungs daily.  Dispense: 60 each; Refill: 5 - albuterol (VENTOLIN HFA) 108 (90 Base) MCG/ACT inhaler; Inhale 2 puffs into the lungs every 6 (six) hours as needed for wheezing or shortness of breath.  Dispense: 8 g; Refill: 0  16. Screening for lung cancer  - CT CHEST LUNG CA SCREEN LOW DOSE W/O CM; Future -USPSTF grade A and B recommendations reviewed with patient; age-appropriate recommendations, preventive care, screening tests, etc discussed and encouraged; healthy living encouraged; see AVS for patient education given to patient -Discussed importance of 150 minutes of physical activity weekly, eat two servings of fish weekly, eat one serving of tree nuts ( cashews, pistachios, pecans, almonds.Marland Kitchen) every other day, eat 6 servings of fruit/vegetables daily and drink plenty of water and avoid sweet beverages.

## 2020-10-16 ENCOUNTER — Other Ambulatory Visit: Payer: Self-pay

## 2020-10-16 ENCOUNTER — Encounter: Payer: Self-pay | Admitting: Family Medicine

## 2020-10-16 ENCOUNTER — Ambulatory Visit (INDEPENDENT_AMBULATORY_CARE_PROVIDER_SITE_OTHER): Payer: BC Managed Care – PPO | Admitting: Family Medicine

## 2020-10-16 VITALS — BP 132/84 | HR 76 | Temp 97.9°F | Resp 16 | Ht 67.0 in | Wt 261.0 lb

## 2020-10-16 DIAGNOSIS — Z0000 Encounter for general adult medical examination without abnormal findings: Secondary | ICD-10-CM | POA: Diagnosis not present

## 2020-10-16 DIAGNOSIS — I517 Cardiomegaly: Secondary | ICD-10-CM | POA: Diagnosis not present

## 2020-10-16 DIAGNOSIS — Z122 Encounter for screening for malignant neoplasm of respiratory organs: Secondary | ICD-10-CM

## 2020-10-16 DIAGNOSIS — E1169 Type 2 diabetes mellitus with other specified complication: Secondary | ICD-10-CM

## 2020-10-16 DIAGNOSIS — E039 Hypothyroidism, unspecified: Secondary | ICD-10-CM

## 2020-10-16 DIAGNOSIS — E1140 Type 2 diabetes mellitus with diabetic neuropathy, unspecified: Secondary | ICD-10-CM

## 2020-10-16 DIAGNOSIS — E785 Hyperlipidemia, unspecified: Secondary | ICD-10-CM

## 2020-10-16 DIAGNOSIS — I872 Venous insufficiency (chronic) (peripheral): Secondary | ICD-10-CM

## 2020-10-16 DIAGNOSIS — Z716 Tobacco abuse counseling: Secondary | ICD-10-CM

## 2020-10-16 DIAGNOSIS — I890 Lymphedema, not elsewhere classified: Secondary | ICD-10-CM

## 2020-10-16 DIAGNOSIS — Z23 Encounter for immunization: Secondary | ICD-10-CM | POA: Diagnosis not present

## 2020-10-16 DIAGNOSIS — R600 Localized edema: Secondary | ICD-10-CM

## 2020-10-16 DIAGNOSIS — J410 Simple chronic bronchitis: Secondary | ICD-10-CM

## 2020-10-16 DIAGNOSIS — I6522 Occlusion and stenosis of left carotid artery: Secondary | ICD-10-CM

## 2020-10-16 DIAGNOSIS — E213 Hyperparathyroidism, unspecified: Secondary | ICD-10-CM

## 2020-10-16 DIAGNOSIS — I10 Essential (primary) hypertension: Secondary | ICD-10-CM

## 2020-10-16 LAB — POCT GLYCOSYLATED HEMOGLOBIN (HGB A1C): Hemoglobin A1C: 5.6 % (ref 4.0–5.6)

## 2020-10-16 MED ORDER — LEVOTHYROXINE SODIUM 137 MCG PO TABS: ORAL_TABLET | ORAL | 0 refills | Status: DC

## 2020-10-16 MED ORDER — AMLODIPINE BESY-BENAZEPRIL HCL 5-40 MG PO CAPS: 1.0000 | ORAL_CAPSULE | Freq: Every day | ORAL | 1 refills | Status: DC

## 2020-10-16 MED ORDER — OZEMPIC (0.25 OR 0.5 MG/DOSE) 2 MG/1.5ML ~~LOC~~ SOPN: 0.5000 mg | PEN_INJECTOR | SUBCUTANEOUS | 5 refills | Status: DC

## 2020-10-16 MED ORDER — TRELEGY ELLIPTA 100-62.5-25 MCG/INH IN AEPB: 1.0000 | INHALATION_SPRAY | Freq: Every day | RESPIRATORY_TRACT | 5 refills | Status: DC

## 2020-10-16 MED ORDER — FUROSEMIDE 20 MG PO TABS: 20.0000 mg | ORAL_TABLET | Freq: Every day | ORAL | 1 refills | Status: DC

## 2020-10-16 MED ORDER — METOPROLOL SUCCINATE ER 100 MG PO TB24: 100.0000 mg | ORAL_TABLET | Freq: Every day | ORAL | 1 refills | Status: DC

## 2020-10-16 MED ORDER — ALBUTEROL SULFATE HFA 108 (90 BASE) MCG/ACT IN AERS
2.0000 | INHALATION_SPRAY | Freq: Four times a day (QID) | RESPIRATORY_TRACT | 0 refills | Status: DC | PRN
Start: 2020-10-16 — End: 2021-04-18

## 2020-10-16 NOTE — Patient Instructions (Signed)
Preventive Care 13-65 Years Old, Female Preventive care refers to lifestyle choices and visits with your health care provider that can promote health and wellness. This includes:  A yearly physical exam. This is also called an annual wellness visit.  Regular dental and eye exams.  Immunizations.  Screening for certain conditions.  Healthy lifestyle choices, such as: ? Eating a healthy diet. ? Getting regular exercise. ? Not using drugs or products that contain nicotine and tobacco. ? Limiting alcohol use. What can I expect for my preventive care visit? Physical exam Your health care provider will check your:  Height and weight. These may be used to calculate your BMI (body mass index). BMI is a measurement that tells if you are at a healthy weight.  Heart rate and blood pressure.  Body temperature.  Skin for abnormal spots. Counseling Your health care provider may ask you questions about your:  Past medical problems.  Family's medical history.  Alcohol, tobacco, and drug use.  Emotional well-being.  Home life and relationship well-being.  Sexual activity.  Diet, exercise, and sleep habits.  Work and work Statistician.  Access to firearms.  Method of birth control.  Menstrual cycle.  Pregnancy history. What immunizations do I need? Vaccines are usually given at various ages, according to a schedule. Your health care provider will recommend vaccines for you based on your age, medical history, and lifestyle or other factors, such as travel or where you work.   What tests do I need? Blood tests  Lipid and cholesterol levels. These may be checked every 5 years, or more often if you are over 79 years old.  Hepatitis C test.  Hepatitis B test. Screening  Lung cancer screening. You may have this screening every year starting at age 38 if you have a 30-pack-year history of smoking and currently smoke or have quit within the past 15 years.  Colorectal cancer  screening. ? All adults should have this screening starting at age 80 and continuing until age 94. ? Your health care provider may recommend screening at age 74 if you are at increased risk. ? You will have tests every 1-10 years, depending on your results and the type of screening test.  Diabetes screening. ? This is done by checking your blood sugar (glucose) after you have not eaten for a while (fasting). ? You may have this done every 1-3 years.  Mammogram. ? This may be done every 1-2 years. ? Talk with your health care provider about when you should start having regular mammograms. This may depend on whether you have a family history of breast cancer.  BRCA-related cancer screening. This may be done if you have a family history of breast, ovarian, tubal, or peritoneal cancers.  Pelvic exam and Pap test. ? This may be done every 3 years starting at age 80. ? Starting at age 86, this may be done every 5 years if you have a Pap test in combination with an HPV test. Other tests  STD (sexually transmitted disease) testing, if you are at risk.  Bone density scan. This is done to screen for osteoporosis. You may have this scan if you are at high risk for osteoporosis. Talk with your health care provider about your test results, treatment options, and if necessary, the need for more tests. Follow these instructions at home: Eating and drinking  Eat a diet that includes fresh fruits and vegetables, whole grains, lean protein, and low-fat dairy products.  Take vitamin and mineral supplements  as recommended by your health care provider.  Do not drink alcohol if: ? Your health care provider tells you not to drink. ? You are pregnant, may be pregnant, or are planning to become pregnant.  If you drink alcohol: ? Limit how much you have to 0-1 drink a day. ? Be aware of how much alcohol is in your drink. In the [place] one drink equals one 12 oz bottle of beer (355 mL), one 5 oz glass of  wine (148 mL), or one 1 oz glass of hard liquor (44 mL).   Lifestyle  Take daily care of your teeth and gums. Brush your teeth every morning and night with fluoride toothpaste. Floss one time each day.  Stay active. Exercise for at least 30 minutes 5 or more days each week.  Do not use any products that contain nicotine or tobacco, such as cigarettes, e-cigarettes, and chewing tobacco. If you need help quitting, ask your health care provider.  Do not use drugs.  If you are sexually active, practice safe sex. Use a condom or other form of protection to prevent STIs (sexually transmitted infections).  If you do not wish to become pregnant, use a form of birth control. If you plan to become pregnant, see your health care provider for a prepregnancy visit.  If told by your health care provider, take low-dose aspirin daily starting at age 90.  Find healthy ways to cope with stress, such as: ? Meditation, yoga, or listening to music. ? Journaling. ? Talking to a trusted person. ? Spending time with friends and family. Safety  Always wear your seat belt while driving or riding in a vehicle.  Do not drive: ? If you have been drinking alcohol. Do not ride with someone who has been drinking. ? When you are tired or distracted. ? While texting.  Wear a helmet and other protective equipment during sports activities.  If you have firearms in your house, make sure you follow all gun safety procedures. What's next?  Visit your health care provider once a year for an annual wellness visit.  Ask your health care provider how often you should have your eyes and teeth checked.  Stay up to date on all vaccines. This information is not intended to replace advice given to you by your health care provider. Make sure you discuss any questions you have with your health care provider. Document Revised: 04/30/2020 Document Reviewed: 04/07/2018 Elsevier Patient Education  2021 [employer].

## 2020-10-23 ENCOUNTER — Other Ambulatory Visit (INDEPENDENT_AMBULATORY_CARE_PROVIDER_SITE_OTHER): Payer: Self-pay | Admitting: Vascular Surgery

## 2020-10-23 ENCOUNTER — Encounter: Payer: Self-pay | Admitting: Family Medicine

## 2020-10-23 DIAGNOSIS — I890 Lymphedema, not elsewhere classified: Secondary | ICD-10-CM

## 2020-10-23 DIAGNOSIS — I872 Venous insufficiency (chronic) (peripheral): Secondary | ICD-10-CM

## 2020-10-24 ENCOUNTER — Ambulatory Visit (INDEPENDENT_AMBULATORY_CARE_PROVIDER_SITE_OTHER): Payer: BC Managed Care – PPO | Admitting: Nurse Practitioner

## 2020-10-24 ENCOUNTER — Other Ambulatory Visit: Payer: Self-pay

## 2020-10-24 ENCOUNTER — Telehealth: Payer: Self-pay | Admitting: *Deleted

## 2020-10-24 ENCOUNTER — Ambulatory Visit (INDEPENDENT_AMBULATORY_CARE_PROVIDER_SITE_OTHER): Payer: BC Managed Care – PPO

## 2020-10-24 VITALS — BP 126/78 | HR 58 | Ht 67.0 in | Wt 261.0 lb

## 2020-10-24 DIAGNOSIS — I890 Lymphedema, not elsewhere classified: Secondary | ICD-10-CM

## 2020-10-24 DIAGNOSIS — M25561 Pain in right knee: Secondary | ICD-10-CM | POA: Diagnosis not present

## 2020-10-24 DIAGNOSIS — F17210 Nicotine dependence, cigarettes, uncomplicated: Secondary | ICD-10-CM | POA: Diagnosis not present

## 2020-10-24 DIAGNOSIS — I872 Venous insufficiency (chronic) (peripheral): Secondary | ICD-10-CM | POA: Diagnosis not present

## 2020-10-24 DIAGNOSIS — E785 Hyperlipidemia, unspecified: Secondary | ICD-10-CM

## 2020-10-24 DIAGNOSIS — M25562 Pain in left knee: Secondary | ICD-10-CM

## 2020-10-24 NOTE — Telephone Encounter (Signed)
Received referral for low dose lung cancer screening CT scan. Message left at phone number listed in EMR for patient to call me back to facilitate scheduling scan.  

## 2020-11-03 ENCOUNTER — Encounter (INDEPENDENT_AMBULATORY_CARE_PROVIDER_SITE_OTHER): Payer: Self-pay | Admitting: Nurse Practitioner

## 2020-11-03 NOTE — Progress Notes (Signed)
Subjective:    Patient ID: Charlene Hardy, female    DOB: [birth year], 65 y.o.   MRN: [id number] Chief Complaint  Patient presents with  . Follow-up    3 mo U/S     Patient is seen for evaluation of leg pain and leg swelling. The patient first noticed the swelling remotely. The swelling is associated with pain and discoloration. The pain and swelling worsens with prolonged dependency and improves with elevation. The pain is unrelated to activity.  The patient notes that in the morning the legs are significantly improved but they steadily worsened throughout the course of the day. The patient also notes a steady worsening of the discoloration in the ankle and shin area.   The patient denies claudication symptoms.  The patient denies symptoms consistent with rest pain.  The patient denies and extensive history of DJD and LS spine disease.  The patient has no had any past angiography, interventions or vascular surgery.  Elevation makes the leg symptoms better, dependency makes them much worse. There is no history of ulcerations. The patient denies any recent changes in medications.  The patient has not been wearing graduated compression.  The patient denies a history of DVT or PE. There is no prior history of phlebitis. There is no history of primary lymphedema.  No history of malignancies. No history of trauma or groin or pelvic surgery. There is no history of radiation treatment to the groin or pelvis  The patient notes multiple falls on her right knee.  Her right knee does tend to be swollen more per her.  The patient underwent noninvasive studies today.  No evidence of DVT or superficial venous thrombosis seen bilaterally.  The patient does have evidence of deep venous insufficiency seen bilaterally however no evidence of superficial venous reflux.  It is noted that there is a Baker's cyst approximately 4.26 cm x 1.22 cm x 3.34 cm by the right knee.   Review of Systems   Cardiovascular: Positive for leg swelling.  All other systems reviewed and are negative.      Objective:   Physical Exam Vitals reviewed.  HENT:     Head: Normocephalic.  Cardiovascular:     Rate and Rhythm: Normal rate.     Pulses: Normal pulses.  Pulmonary:     Effort: Pulmonary effort is normal.  Musculoskeletal:     Right lower leg: Edema present.     Left lower leg: Edema present.  Skin:    General: Skin is warm and dry.  Neurological:     Mental Status: She is alert and oriented to person, place, and time.  Psychiatric:        Mood and Affect: Mood normal.        Behavior: Behavior normal.        Thought Content: Thought content normal.        Judgment: Judgment normal.     BP 126/78   Pulse (!) 58   Ht 5\' 7"  (1.702 m)   Wt 261 lb (118.4 kg)   BMI 40.88 kg/m   Past Medical History:  Diagnosis Date  . Arthritis   . Diabetes mellitus without complication ([facility])   . Heart murmur   . Hyperlipidemia   . Hypertension   . Thyroid disease     Social History   Socioeconomic History  . Marital status: Single    Spouse name: Not on file  . Number of children: 0  . Years of education: Not on file  .  Highest education level: High school graduate  Occupational History  . Not on file  Tobacco Use  . Smoking status: Current Every Day Smoker    Packs/day: 1.00    Years: 41.00    Pack years: 41.00    Types: Cigarettes    Start date: 08/10/1976  . Smokeless tobacco: Never Used  . Tobacco comment: cutting down, smoking half pack lately   Vaping Use  . Vaping Use: Never used  Substance and Sexual Activity  . Alcohol use: No    Alcohol/week: 0.0 standard drinks  . Drug use: No  . Sexual activity: Not Currently  Other Topics Concern  . Not on file  Social History Narrative  . Not on file   Social Determinants of Health   Financial Resource Strain: Low Risk   . Difficulty of Paying Living Expenses: Not hard at all  Food Insecurity: No Food Insecurity  .  Worried About Charity fundraiser in the Last Year: Never true  . Ran Out of Food in the Last Year: Never true  Transportation Needs: No Transportation Needs  . Lack of Transportation (Medical): No  . Lack of Transportation (Non-Medical): No  Physical Activity: Insufficiently Active  . Days of Exercise per Week: 4 days  . Minutes of Exercise per Session: 30 min  Stress: No Stress Concern Present  . Feeling of Stress : Not at all  Social Connections: Socially Isolated  . Frequency of Communication with Friends and Family: More than three times a week  . Frequency of Social Gatherings with Friends and Family: Once a week  . Attends Religious Services: Never  . Active Member of Clubs or Organizations: No  . Attends Archivist Meetings: Never  . Marital Status: Never married  Intimate Partner Violence: Not At Risk  . Fear of Current or Ex-Partner: No  . Emotionally Abused: No  . Physically Abused: No  . Sexually Abused: No    Past Surgical History:  Procedure Laterality Date  . ABDOMINAL HYSTERECTOMY  08/11/1999   still has ovaries  . BREAST BIOPSY Right 09/05/2018   Affirm Bx- Ribbon clip- path pending  . COLONOSCOPY  08/11/2015  . COLONOSCOPY WITH PROPOFOL N/A 06/10/2016   Procedure: COLONOSCOPY WITH PROPOFOL;  Surgeon: Christene Lye, MD;  Location: [facility] ENDOSCOPY;  Service: Endoscopy;  Laterality: N/A;    Family History  Problem Relation Age of Onset  . Multiple sclerosis Mother   . Diabetes Father   . Breast cancer Sister 34  . Other Sister        DDD - she had to have back surgery  . Hypertension Maternal Grandmother   . Thyroid disease Paternal Uncle   . Breast cancer Paternal Aunt   . Heart Problems Paternal Aunt     Allergies  Allergen Reactions  . Hydrochlorothiazide     hyperparathyroidism  . Pneumococcal Vaccine Rash  . Pneumovax [Pneumococcal Polysaccharide Vaccine]     CBC Latest Ref Rng & Units 09/03/2020 03/22/2020 03/31/2019  WBC -  8.5 8.5 8.6  Hemoglobin 12.0 - 16.0 12.6 12.9 13.5  Hematocrit 36 - 46 39 40.4 41.9  Platelets 150 - 399 212 260 265      CMP     Component Value Date/Time   NA 140 09/03/2020 0000   NA 133 (L) 08/18/2014 1941   K 4.3 09/03/2020 0000   K 3.9 08/18/2014 1941   CL 107 03/22/2020 1159   CL 100 08/18/2014 1941   CO2 28 03/22/2020  1159   CO2 28 08/18/2014 1941   GLUCOSE 87 03/22/2020 1159   GLUCOSE 109 (H) 08/18/2014 1941   BUN 9 03/22/2020 1159   BUN 10 04/25/2015 0857   BUN 9 08/18/2014 1941   CREATININE 0.7 09/03/2020 0000   CREATININE 0.81 03/22/2020 1159   CALCIUM 10.7 (H) 03/22/2020 1159   CALCIUM 9.7 08/18/2014 1941   PROT 6.5 03/22/2020 1159   PROT 6.8 04/25/2015 0857   PROT 7.2 08/18/2014 1941   ALBUMIN 3.8 11/02/2016 1013   ALBUMIN 4.0 04/25/2015 0857   ALBUMIN 3.5 08/18/2014 1941   AST 9 (L) 03/22/2020 1159   AST 11 (L) 08/18/2014 1941   ALT 9 03/22/2020 1159   ALT 18 08/18/2014 1941   ALKPHOS 96 11/02/2016 1013   ALKPHOS 105 08/18/2014 1941   BILITOT 0.3 03/22/2020 1159   BILITOT 0.3 04/25/2015 0857   BILITOT 0.4 08/18/2014 1941   GFRNONAA 77 03/22/2020 1159   GFRAA 89 03/22/2020 1159     No results found.     Assessment & Plan:   1. Lymphedema No surgery or intervention at this point in time.    I have reviewed my discussion with the patient regarding venous insufficiency and secondary lymph edema and why it  causes symptoms. I have discussed with the patient the chronic skin changes that accompany these problems and the long term sequela such as ulceration and infection.  Patient will continue wearing graduated compression stockings class 1 (20-30 mmHg) on a daily basis a prescription was given to the patient to keep this updated. The patient will  put the stockings on first thing in the morning and removing them in the evening. The patient is instructed specifically not to sleep in the stockings.  In addition, behavioral modification including  elevation during the day will be continued.  Diet and salt restriction was also discussed.  Previous duplex ultrasound of the lower extremities shows deep venous insufficiency, superficial reflux was not present.   Following the review of the ultrasound the patient will follow up in 6 months to reassess the degree of swelling and the control that graduated compression is offering.   The patient can be assessed for a Lymph Pump at that time.  However, at this time the patient states they are satisfied with the control compression and elevation is yielding.    2. Arthralgia of both knees This may also account for some of the patient's swelling and pain.  The patient notes that she has fallen multiple times on her right knee.  There is also a noted Baker's cyst behind her right knee measuring 4.26 cm x 1.32 cm x 3.34 cm.  3. Cigarette smoker Smoking cessation was discussed, 3-10 minutes spent on this topic specifically   4. Dyslipidemia Continue statin as ordered and reviewed, no changes at this time   Current Outpatient Medications on File Prior to Visit  Medication Sig Dispense Refill  . acetaminophen (TYLENOL) 500 MG tablet Take 1 tablet (500 mg total) by mouth 2 (two) times daily. 30 tablet 0  . albuterol (VENTOLIN HFA) 108 (90 Base) MCG/ACT inhaler Inhale 2 puffs into the lungs every 6 (six) hours as needed for wheezing or shortness of breath. 8 g 0  . amLODipine-benazepril (LOTREL) 5-40 MG capsule Take 1 capsule by mouth daily. 90 capsule 1  . aspirin 81 MG tablet Take 1 tablet by mouth daily.    Marland Kitchen atorvastatin (LIPITOR) 40 MG tablet Take 1 tablet (40 mg total) by mouth daily.  90 tablet 1  . Cholecalciferol (VITAMIN D) 2000 UNITS tablet Take 1 tablet by mouth daily.    . ferrous sulfate 325 (65 FE) MG tablet Take 1 tablet by mouth daily.    . Fluticasone-Umeclidin-Vilant (TRELEGY ELLIPTA) 100-62.5-25 MCG/INH AEPB Inhale 1 puff into the lungs daily. 60 each 5  . furosemide (LASIX) 20 MG  tablet Take 1 tablet (20 mg total) by mouth daily. 90 tablet 1  . gabapentin (NEURONTIN) 300 MG capsule Take 1 capsule (300 mg total) by mouth 4 (four) times daily. 360 capsule 1  . levothyroxine (EUTHYROX) 137 MCG tablet TAKE 1 TABLET DAILY BEFORE BREAKFAST 6 DAYS A WEEK, DO NOT TAKE ANY ON SUNDAYS 72 tablet 0  . metoprolol succinate (TOPROL-XL) 100 MG 24 hr tablet Take 1 tablet (100 mg total) by mouth daily. 90 tablet 1  . MULTIPLE VITAMINS-MINERALS ER PO Take 1 tablet by mouth daily.    . naftifine (NAFTIN) 1 % cream APPLY  CREAM TOPICALLY ONCE DAILY 90 g 0  . omega-3 fish oil (MAXEPA) 1000 MG CAPS capsule Take 1 capsule by mouth daily.    . Polyethylene Glycol 3350 POWD Take 1 Dose by mouth as needed.    . Semaglutide,0.25 or 0.5MG /DOS, (OZEMPIC, 0.25 OR 0.5 MG/DOSE,) 2 MG/1.5ML SOPN Inject 0.5 mg into the skin once a week. 1.5 mL 5  . triamcinolone cream (KENALOG) 0.1 % Apply 1 application topically 2 (two) times daily. Mix with Eucerin or Nivea lotion ( at home  ) 453.6 g 0  . potassium chloride SA (KLOR-CON) 20 MEQ tablet Take 1 tablet (20 mEq total) by mouth once for 1 dose. 90 tablet 1   No current facility-administered medications on file prior to visit.    There are no Patient Instructions on file for this visit. No follow-ups on file.   Kris Hartmann, NP

## 2020-11-06 ENCOUNTER — Other Ambulatory Visit: Payer: Self-pay | Admitting: Family Medicine

## 2020-11-06 DIAGNOSIS — E039 Hypothyroidism, unspecified: Secondary | ICD-10-CM

## 2020-11-06 DIAGNOSIS — I10 Essential (primary) hypertension: Secondary | ICD-10-CM

## 2020-11-06 DIAGNOSIS — R600 Localized edema: Secondary | ICD-10-CM

## 2020-11-14 ENCOUNTER — Telehealth: Payer: Self-pay | Admitting: *Deleted

## 2020-11-14 NOTE — Telephone Encounter (Signed)
Message left at home phone number listed in EMR for patient to call me back to facilitate scheduling scan. Work number was unreachable.

## 2020-11-15 ENCOUNTER — Telehealth: Payer: Self-pay | Admitting: *Deleted

## 2020-11-15 DIAGNOSIS — F17200 Nicotine dependence, unspecified, uncomplicated: Secondary | ICD-10-CM

## 2020-11-15 DIAGNOSIS — Z87891 Personal history of nicotine dependence: Secondary | ICD-10-CM

## 2020-11-15 DIAGNOSIS — Z122 Encounter for screening for malignant neoplasm of respiratory organs: Secondary | ICD-10-CM

## 2020-11-15 NOTE — Telephone Encounter (Signed)
Patient returned my call and provided smoking history,(current every day smoker, 1 ppd x 41 yrs) as well as answering questions related to screening process. Patient denies signs of lung cancer such as weight loss or hemoptysis. Patient denies comorbidity that would prevent curative treatment if lung cancer were found. Patient is scheduled for shared decision making visit and CT scan on 12/18/20 @ 10:30 am.

## 2020-11-17 ENCOUNTER — Other Ambulatory Visit: Payer: Self-pay | Admitting: Family Medicine

## 2020-11-17 DIAGNOSIS — I10 Essential (primary) hypertension: Secondary | ICD-10-CM

## 2020-11-18 ENCOUNTER — Encounter: Payer: Self-pay | Admitting: *Deleted

## 2020-12-10 DIAGNOSIS — E559 Vitamin D deficiency, unspecified: Secondary | ICD-10-CM | POA: Diagnosis not present

## 2020-12-10 DIAGNOSIS — E210 Primary hyperparathyroidism: Secondary | ICD-10-CM | POA: Diagnosis not present

## 2020-12-10 DIAGNOSIS — E039 Hypothyroidism, unspecified: Secondary | ICD-10-CM | POA: Diagnosis not present

## 2020-12-18 ENCOUNTER — Ambulatory Visit
Admission: RE | Admit: 2020-12-18 | Discharge: 2020-12-18 | Disposition: A | Discharge status: Home / Self Care | Payer: BC Managed Care – PPO | Source: Ambulatory Visit | Attending: Oncology | Admitting: Oncology

## 2020-12-18 ENCOUNTER — Other Ambulatory Visit: Payer: Self-pay

## 2020-12-18 ENCOUNTER — Inpatient Hospital Stay: Payer: BC Managed Care – PPO | Attending: Oncology | Admitting: Hospice and Palliative Medicine

## 2020-12-18 DIAGNOSIS — Z87891 Personal history of nicotine dependence: Secondary | ICD-10-CM | POA: Diagnosis not present

## 2020-12-18 DIAGNOSIS — F17200 Nicotine dependence, unspecified, uncomplicated: Secondary | ICD-10-CM

## 2020-12-18 DIAGNOSIS — Z122 Encounter for screening for malignant neoplasm of respiratory organs: Secondary | ICD-10-CM

## 2020-12-18 NOTE — Progress Notes (Signed)
Virtual Visit via Telephone Note  I connected with@ on 12/18/20 at@ by a telephone and verified that I am speaking with the correct person using two identifiers.   I discussed the limitations of evaluation and management by telemedicine and the availability of in person appointments. The patient expressed understanding and agreed to proceed.  Location: Patient: [facility] Provider: Home  In accordance with CMS guidelines, patient has met eligibility criteria including age, absence of signs or symptoms of lung cancer.  Social History   Tobacco Use  . Smoking status: Current Every Day Smoker    Packs/day: 1.00    Years: 41.00    Pack years: 41.00    Types: Cigarettes    Start date: 08/10/1976  . Smokeless tobacco: Never Used  . Tobacco comment: cutting down, smoking half pack lately   Vaping Use  . Vaping Use: Never used  Substance Use Topics  . Alcohol use: No    Alcohol/week: 0.0 standard drinks  . Drug use: No      A shared decision-making session was conducted prior to the performance of CT scan. This includes one or more decision aids, includes benefits and harms of screening, follow-up diagnostic testing, over-diagnosis, false positive rate, and total radiation exposure.   Counseling on the importance of adherence to annual lung cancer LDCT screening, impact of co-morbidities, and ability or willingness to undergo diagnosis and treatment is imperative for compliance of the program.   Counseling on the importance of continued smoking cessation for former smokers; the importance of smoking cessation for current smokers, and information about tobacco cessation interventions have been given to patient including [place] and [place] quit [place] programs.   Written order for lung cancer screening with LDCT has been given to the patient and any and all questions have been answered to the best of my abilities.    Yearly follow up will be coordinated by Burgess Estelle, Thoracic  Navigator.  Time Total: 10 minutes  Visit consisted of counseling and education dealing with complex health screening. Greater than 50%  of this time was spent counseling and coordinating care related to the above assessment and plan.  Signed by: Altha Harm, PhD, NP-C

## 2020-12-27 ENCOUNTER — Telehealth: Payer: Self-pay | Admitting: *Deleted

## 2020-12-27 NOTE — Telephone Encounter (Signed)

## 2021-01-28 DIAGNOSIS — H524 Presbyopia: Secondary | ICD-10-CM | POA: Diagnosis not present

## 2021-01-28 DIAGNOSIS — H5213 Myopia, bilateral: Secondary | ICD-10-CM | POA: Diagnosis not present

## 2021-01-28 LAB — HM DIABETES EYE EXAM

## 2021-02-12 ENCOUNTER — Other Ambulatory Visit: Payer: Self-pay | Admitting: Family Medicine

## 2021-02-12 DIAGNOSIS — E039 Hypothyroidism, unspecified: Secondary | ICD-10-CM

## 2021-02-12 DIAGNOSIS — R600 Localized edema: Secondary | ICD-10-CM

## 2021-02-22 ENCOUNTER — Other Ambulatory Visit: Payer: Self-pay | Admitting: Family Medicine

## 2021-02-22 DIAGNOSIS — I10 Essential (primary) hypertension: Secondary | ICD-10-CM

## 2021-02-22 NOTE — Telephone Encounter (Signed)
Requested Prescriptions  Pending Prescriptions Disp Refills  . metoprolol succinate (TOPROL-XL) 100 MG 24 hr tablet [Pharmacy Med Name: Metoprolol Succinate ER 100 MG Oral Tablet Extended Release 24 Hour] 90 tablet 0    Sig: Take 1 tablet by mouth once daily     Cardiovascular:  Beta Blockers Passed - 02/22/2021  6:05 AM      Passed - Last BP in normal range    BP Readings from Last 1 Encounters:  10/24/20 126/78         Passed - Last Heart Rate in normal range    Pulse Readings from Last 1 Encounters:  10/24/20 (!) 58         Passed - Valid encounter within last 6 months    Recent Outpatient Visits          4 months ago Type 2 diabetes, controlled, with neuropathy ([place])   [facility] [place], Drue Stager, MD   8 months ago Type 2 diabetes, controlled, with neuropathy [facility])   [facility] [place], Drue Stager, MD   11 months ago Type 2 diabetes, controlled, with neuropathy [facility])   [facility] Steele Sizer, MD   1 year ago Type 2 diabetes, controlled, with neuropathy [facility])   [facility] Steele Sizer, MD   1 year ago Type 2 diabetes, controlled, with neuropathy [facility])   [facility] Steele Sizer, MD      Future Appointments            In 1 month Ancil Boozer, Drue Stager, MD [facility], [facility]

## 2021-04-17 NOTE — Progress Notes (Signed)
Name: Charlene [place]   MRN: [id number]    DOB: 03-Sep-1955   Date:04/18/2021       Progress Note  Subjective  Chief Complaint  Follow Up  HPI  DMII: she started Ozempic 09/2018 ( previously on Trulicity and Metformin) . She has peripheral neuropathy , doing better on Gabapentin and only occasionally has numbness on her legs.  She denies polyphagia, but she has polydipsia ( likely secondary to  hyperparathyroidism )  she also has nocturia, she states she sips on water during the night, she also has associated obesity,tinea pedis  dyslipidemia and HTN. She takes ARB and states even though A1C has been controlled does not want to stop Ozempic because it curbs her appetite.   Denies  hypoglycemic episodes . A1C done today was normal, today was 5.5 %     Morbid obesity: with multiple co-morbidities DM, HTN, hyperlipidemia, weight is stable.    Bilateral knee arthralgias: she states doing well today.  She is off Meloxicam because of kidney function and is taking Tylenol and Gabapentin. She is doing better since she bought an arch support brace.   Chronic bronchitis: She is still smoking She has SOB with moderate activity she also has dry cough, very seldom has wheezing. She got the rx of Trelegy filled but never started, we demonstrated to her in our office today and she will start using it daily    HTN: she is taking medications as prescribed, bp is at goal. No chest pain , dizziness or palpitation. She has sob with moderate activity . She takes furosemide a few times a week and is controlling lower extremity edema    Dyslipidemia: low HDL, on Atorvastatin and otc fish oil , last LDL was down to 45, HDL was low at 33 , we will recheck next visit    Hypothyroidism: TSH level was done last visit and it was 2. she is on levothyroxine 137 mcg daily and none on Sundays No dysphagia, no change in bowel movements , skin is always dry - worse on legs    Hyperparathyroidism: seeing Endocrinologist - Dr.  Honor Junes , bone density showed mild osteopenia. She sates cramps improved. Had a bone density done 03/05/2020 that was [facility] at [facility] ,last calcium level at 10.4  we will recheck labs next visit   Patient Active Problem List   Diagnosis Date Noted   Lymphedema 07/24/2020   Chronic venous insufficiency 07/24/2020   Morbid obesity ([place]) 09/13/2018   Mild concentric left ventricular hypertrophy (LVH) 05/31/2018   Carotid atherosclerosis, left 05/31/2018   Primary hyperparathyroidism ([place]) 02/28/2016   Arthralgia of both knees 10/23/2015   Chronic bronchitis ([place]) 10/23/2015   Osteopenia 10/23/2015   Left ankle pain 04/25/2015   Constipation 04/25/2015   Allergic rhinitis 01/20/2015   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/2015   Diabetic sensorimotor neuropathy ([place]) 01/20/2015   Dyslipidemia 01/20/2015   Essential (primary) hypertension 01/20/2015   H/O iron deficiency anemia 01/20/2015   Hypertensive retinopathy 01/20/2015   Adult hypothyroidism 01/20/2015   Extreme obesity 01/20/2015   Background retinopathy due to secondary diabetes ([place]) 01/20/2015   Tinea pedis 01/20/2015   Vitamin D deficiency 02/26/2010   Cigarette smoker 03/06/2008    Past Surgical History:  Procedure Laterality Date   ABDOMINAL HYSTERECTOMY  08/11/1999   still has ovaries   BREAST BIOPSY Right 09/05/2018   Affirm Bx- Ribbon clip- path pending   COLONOSCOPY  08/11/2015   COLONOSCOPY WITH PROPOFOL N/A 06/10/2016   Procedure: COLONOSCOPY WITH PROPOFOL;  Surgeon: Christene Lye, MD;  Location: [facility] ENDOSCOPY;  Service: Endoscopy;  Laterality: N/A;    Family History  Problem Relation Age of Onset   Multiple sclerosis Mother    Diabetes Father    Breast cancer Sister 67   Other Sister        DDD - she had to have back surgery   Hypertension Maternal Grandmother    Thyroid disease Paternal Uncle    Breast cancer Paternal Aunt    Heart Problems Paternal Aunt     Social History   Tobacco  Use   Smoking status: Every Day    Packs/day: 1.00    Years: 41.00    Pack years: 41.00    Types: Cigarettes    Start date: 08/10/1976   Smokeless tobacco: Never   Tobacco comments:    cutting down, smoking half pack lately   Substance Use Topics   Alcohol use: No    Alcohol/week: 0.0 standard drinks     Current Outpatient Medications:    acetaminophen (TYLENOL) 500 MG tablet, Take 1 tablet (500 mg total) by mouth 2 (two) times daily., Disp: 30 tablet, Rfl: 0   albuterol (VENTOLIN HFA) 108 (90 Base) MCG/ACT inhaler, Inhale 2 puffs into the lungs every 6 (six) hours as needed for wheezing or shortness of breath., Disp: 8 g, Rfl: 0   amLODipine-benazepril (LOTREL) 5-40 MG capsule, Take 1 capsule by mouth daily., Disp: 90 capsule, Rfl: 1   aspirin 81 MG tablet, Take 1 tablet by mouth daily., Disp: , Rfl:    atorvastatin (LIPITOR) 40 MG tablet, Take 1 tablet (40 mg total) by mouth daily., Disp: 90 tablet, Rfl: 1   Cholecalciferol (VITAMIN D) 2000 UNITS tablet, Take 1 tablet by mouth daily., Disp: , Rfl:    ferrous sulfate 325 (65 FE) MG tablet, Take 1 tablet by mouth daily., Disp: , Rfl:    Fluticasone-Umeclidin-Vilant (TRELEGY ELLIPTA) 100-62.5-25 MCG/INH AEPB, Inhale 1 puff into the lungs daily., Disp: 60 each, Rfl: 5   furosemide (LASIX) 20 MG tablet, Take 1 tablet (20 mg total) by mouth daily., Disp: 90 tablet, Rfl: 1   gabapentin (NEURONTIN) 300 MG capsule, Take 1 capsule (300 mg total) by mouth 4 (four) times daily., Disp: 360 capsule, Rfl: 1   KLOR-CON M20 20 MEQ tablet, Take 1 tablet by mouth once daily, Disp: 90 tablet, Rfl: 0   levothyroxine (SYNTHROID) 137 MCG tablet, TAKE 1 TABLET DAILY BEFORE BREAKFAST 6 DAYS PER WEEK, DO NOT TAKE ANY ON SUNDAYS, Disp: 72 tablet, Rfl: 0   metoprolol succinate (TOPROL-XL) 100 MG 24 hr tablet, Take 1 tablet by mouth once daily, Disp: 90 tablet, Rfl: 0   MULTIPLE VITAMINS-MINERALS ER PO, Take 1 tablet by mouth daily., Disp: , Rfl:    naftifine  (NAFTIN) 1 % cream, APPLY  CREAM TOPICALLY ONCE DAILY, Disp: 90 g, Rfl: 0   omega-3 fish oil (MAXEPA) 1000 MG CAPS capsule, Take 1 capsule by mouth daily., Disp: , Rfl:    Polyethylene Glycol 3350 POWD, Take 1 Dose by mouth as needed., Disp: , Rfl:    Semaglutide,0.25 or 0.'5MG'$ /DOS, (OZEMPIC, 0.25 OR 0.5 MG/DOSE,) 2 MG/1.5ML SOPN, Inject 0.5 mg into the skin once a week., Disp: 1.5 mL, Rfl: 5   triamcinolone cream (KENALOG) 0.1 %, Apply 1 application topically 2 (two) times daily. Mix with Eucerin or Nivea lotion ( at home  ), Disp: 453.6 g, Rfl: 0  Allergies  Allergen Reactions   Hydrochlorothiazide  hyperparathyroidism   Pneumococcal Vaccine Rash   Pneumovax [Pneumococcal Polysaccharide Vaccine]     I personally reviewed active problem list, medication list, allergies, family history, social history, health maintenance with the patient/caregiver today.   ROS  Constitutional: Negative for fever or weight change.  Respiratory: positive  for cough and shortness of breath.   Cardiovascular: Negative for chest pain or palpitations.  Gastrointestinal: Negative for abdominal pain, no bowel changes.  Musculoskeletal: Negative for gait problem or joint swelling.  Skin: Negative for rash.  Neurological: Negative for dizziness or headache.  No other specific complaints in a complete review of systems (except as listed in HPI above).   Objective  Vitals:   04/18/21 0846  BP: 136/72  Pulse: 73  Resp: 16  Temp: 98.3 F (36.8 C)  SpO2: 99%  Weight: 260 lb (117.9 kg)  Height: '5\' 7"'$  (1.702 m)    Body mass index is 40.72 kg/m.  Physical Exam  Constitutional: Patient appears well-developed and well-nourished. Obese  No distress.  HEENT: head atraumatic, normocephalic, pupils equal and reactive to light, neck supple Cardiovascular: Normal rate, regular rhythm and normal heart sounds.  No murmur heard. No BLE edema. Pulmonary/Chest: Effort normal and breath sounds normal. No  respiratory distress. Abdominal: Soft.  There is no tenderness. Psychiatric: Patient has a normal mood and affect. behavior is normal. Judgment and thought content normal.    Recent Results (from the past 2160 hour(s))  POCT HgB A1C     Status: None   Collection Time: 04/18/21  8:57 AM  Result Value Ref Range   Hemoglobin A1C 5.5 4.0 - 5.6 %   HbA1c POC (<> result, manual entry)     HbA1c, POC (prediabetic range)     HbA1c, POC (controlled diabetic range)       PHQ2/9: Depression screen [facility] 2/9 04/18/2021 10/16/2020 06/25/2020 03/22/2020 01/02/2020  Decreased Interest 0 0 0 0 0  Down, Depressed, Hopeless 0 0 0 0 0  PHQ - 2 Score 0 0 0 0 0  Altered sleeping - 0 0 0 0  Tired, decreased energy - 0 0 0 0  Change in appetite - 0 0 0 0  Feeling bad or failure about yourself  - 0 0 0 0  Trouble concentrating - 0 0 0 0  Moving slowly or fidgety/restless - 0 0 0 0  Suicidal thoughts - 0 0 0 0  PHQ-9 Score - 0 0 0 0  Difficult doing work/chores - - - Not difficult at all Not difficult at all  Some recent data might be hidden    phq 9 is negative   Fall Risk: Fall Risk  04/18/2021 10/16/2020 06/25/2020 03/22/2020 01/02/2020  Falls in the past year? 0 0 0 0 1  Comment - - - - -  Number falls in past yr: 0 0 0 0 1  Injury with Fall? 0 0 0 0 0  Risk for fall due to : No Fall Risks - - - -  Follow up Falls prevention discussed - - - -      Functional Status Survey: Is the patient deaf or have difficulty hearing?: No Does the patient have difficulty seeing, even when wearing glasses/contacts?: No Does the patient have difficulty concentrating, remembering, or making decisions?: No Does the patient have difficulty walking or climbing stairs?: No Does the patient have difficulty dressing or bathing?: No Does the patient have difficulty doing errands alone such as visiting a doctor's office or shopping?: No  Assessment & Plan   1. Type 2 diabetes, controlled, with neuropathy (HCC)  -  POCT HgB A1C - gabapentin (NEURONTIN) 300 MG capsule; Take 1 capsule (300 mg total) by mouth 4 (four) times daily.  Dispense: 360 capsule; Refill: 1 - Semaglutide,0.25 or 0.'5MG'$ /DOS, (OZEMPIC, 0.25 OR 0.5 MG/DOSE,) 2 MG/1.5ML SOPN; Inject 0.5 mg into the skin once a week.  Dispense: 1.5 mL; Refill: 5  2. Need for immunization against influenza  - Flu Vaccine QUAD High Dose(Fluad)  3. Morbid obesity ([place])  Discussed with the patient the risk posed by an increased BMI. Discussed importance of portion control, calorie counting and at least 150 minutes of physical activity weekly. Avoid sweet beverages and drink more water. Eat at least 6 servings of fruit and vegetables daily    4. Dyslipidemia associated with type 2 diabetes mellitus (HCC)  - atorvastatin (LIPITOR) 40 MG tablet; Take 1 tablet (40 mg total) by mouth daily.  Dispense: 90 tablet; Refill: 1  5. Essential (primary) hypertension  - amLODipine-benazepril (LOTREL) 5-40 MG capsule; Take 1 capsule by mouth daily.  Dispense: 90 capsule; Refill: 1 - metoprolol succinate (TOPROL-XL) 100 MG 24 hr tablet; Take 1 tablet (100 mg total) by mouth daily. Take with or immediately following a meal.  Dispense: 90 tablet; Refill: 1  6. Acquired hypothyroidism  - levothyroxine (SYNTHROID) 137 MCG tablet; TAKE 1 TABLET DAILY BEFORE BREAKFAST 6 DAYS PER WEEK, DO NOT TAKE ANY ON SUNDAYS  Dispense: 72 tablet; Refill: 1  7. Hyperparathyroidism ([place])    8. Bilateral edema of lower extremity   9. Simple chronic bronchitis ([place])  Continue Trelegy   10. Tinea pedis of both feet  - naftifine (NAFTIN) 1 % cream; APPLY  CREAM TOPICALLY ONCE DAILY  Dispense: 90 g; Refill: 0

## 2021-04-18 ENCOUNTER — Encounter: Payer: Self-pay | Admitting: Family Medicine

## 2021-04-18 ENCOUNTER — Ambulatory Visit: Payer: BC Managed Care – PPO | Admitting: Family Medicine

## 2021-04-18 ENCOUNTER — Other Ambulatory Visit: Payer: Self-pay

## 2021-04-18 VITALS — BP 136/72 | HR 73 | Temp 98.3°F | Resp 16 | Ht 67.0 in | Wt 260.0 lb

## 2021-04-18 DIAGNOSIS — E1140 Type 2 diabetes mellitus with diabetic neuropathy, unspecified: Secondary | ICD-10-CM | POA: Diagnosis not present

## 2021-04-18 DIAGNOSIS — Z23 Encounter for immunization: Secondary | ICD-10-CM | POA: Diagnosis not present

## 2021-04-18 DIAGNOSIS — E1169 Type 2 diabetes mellitus with other specified complication: Secondary | ICD-10-CM | POA: Diagnosis not present

## 2021-04-18 DIAGNOSIS — E785 Hyperlipidemia, unspecified: Secondary | ICD-10-CM

## 2021-04-18 DIAGNOSIS — E039 Hypothyroidism, unspecified: Secondary | ICD-10-CM

## 2021-04-18 DIAGNOSIS — J410 Simple chronic bronchitis: Secondary | ICD-10-CM

## 2021-04-18 DIAGNOSIS — B353 Tinea pedis: Secondary | ICD-10-CM

## 2021-04-18 DIAGNOSIS — I10 Essential (primary) hypertension: Secondary | ICD-10-CM

## 2021-04-18 DIAGNOSIS — I517 Cardiomegaly: Secondary | ICD-10-CM

## 2021-04-18 DIAGNOSIS — E213 Hyperparathyroidism, unspecified: Secondary | ICD-10-CM

## 2021-04-18 LAB — POCT GLYCOSYLATED HEMOGLOBIN (HGB A1C): Hemoglobin A1C: 5.5 % (ref 4.0–5.6)

## 2021-04-18 MED ORDER — GABAPENTIN 300 MG PO CAPS: 300.0000 mg | ORAL_CAPSULE | Freq: Four times a day (QID) | ORAL | 1 refills | Status: DC

## 2021-04-18 MED ORDER — NAFTIFINE HCL 1 % EX CREA: TOPICAL_CREAM | CUTANEOUS | 0 refills | Status: DC

## 2021-04-18 MED ORDER — METOPROLOL SUCCINATE ER 100 MG PO TB24: 100.0000 mg | ORAL_TABLET | Freq: Every day | ORAL | 1 refills | Status: DC

## 2021-04-18 MED ORDER — OZEMPIC (0.25 OR 0.5 MG/DOSE) 2 MG/1.5ML ~~LOC~~ SOPN: 0.5000 mg | PEN_INJECTOR | SUBCUTANEOUS | 5 refills | Status: DC

## 2021-04-18 MED ORDER — ATORVASTATIN CALCIUM 40 MG PO TABS: 40.0000 mg | ORAL_TABLET | Freq: Every day | ORAL | 1 refills | Status: DC

## 2021-04-18 MED ORDER — LEVOTHYROXINE SODIUM 137 MCG PO TABS: ORAL_TABLET | ORAL | 1 refills | Status: DC

## 2021-04-18 MED ORDER — AMLODIPINE BESY-BENAZEPRIL HCL 5-40 MG PO CAPS: 1.0000 | ORAL_CAPSULE | Freq: Every day | ORAL | 1 refills | Status: DC

## 2021-04-24 ENCOUNTER — Ambulatory Visit (INDEPENDENT_AMBULATORY_CARE_PROVIDER_SITE_OTHER): Payer: BC Managed Care – PPO | Admitting: Vascular Surgery

## 2021-04-25 ENCOUNTER — Encounter (INDEPENDENT_AMBULATORY_CARE_PROVIDER_SITE_OTHER): Payer: Self-pay | Admitting: Nurse Practitioner

## 2021-04-25 ENCOUNTER — Ambulatory Visit (INDEPENDENT_AMBULATORY_CARE_PROVIDER_SITE_OTHER): Payer: BC Managed Care – PPO | Admitting: Nurse Practitioner

## 2021-04-25 ENCOUNTER — Other Ambulatory Visit: Payer: Self-pay

## 2021-04-25 VITALS — BP 153/76 | HR 65 | Resp 16 | Wt 260.8 lb

## 2021-04-25 DIAGNOSIS — F17210 Nicotine dependence, cigarettes, uncomplicated: Secondary | ICD-10-CM | POA: Diagnosis not present

## 2021-04-25 DIAGNOSIS — I10 Essential (primary) hypertension: Secondary | ICD-10-CM | POA: Diagnosis not present

## 2021-04-25 DIAGNOSIS — I890 Lymphedema, not elsewhere classified: Secondary | ICD-10-CM

## 2021-04-25 NOTE — Progress Notes (Signed)
Subjective:    Patient ID: Charlene [place], female    DOB: 1956/06/20, 65 y.o.   MRN: [id number] Chief Complaint  Patient presents with   Follow-up    6 month follow up    The patient returns to the office for followup evaluation regarding leg swelling.  The swelling has improved quite a bit and the pain associated with swelling has decreased substantially. There have not been any interval development of a ulcerations or wounds.  Since the previous visit the patient has been wearing graduated compression stockings and has noted little significant improvement in the lymphedema. The patient has been using compression routinely morning until night.  The patient also states elevation during the day and exercise is being done too.        Review of Systems  Cardiovascular:  Positive for leg swelling.  All other systems reviewed and are negative.     Objective:   Physical Exam Vitals reviewed.  HENT:     Head: Normocephalic.  Cardiovascular:     Rate and Rhythm: Normal rate.  Pulmonary:     Effort: Pulmonary effort is normal.  Musculoskeletal:        General: Swelling present.  Skin:    General: Skin is warm and dry.  Neurological:     Mental Status: She is alert and oriented to person, place, and time.  Psychiatric:        Mood and Affect: Mood normal.        Behavior: Behavior normal.        Thought Content: Thought content normal.        Judgment: Judgment normal.    BP (!) 153/76 (BP Location: Right Arm)   Pulse 65   Resp 16   Wt 260 lb 12.8 oz (118.3 kg)   BMI 40.85 kg/m   Past Medical History:  Diagnosis Date   Arthritis    Diabetes mellitus without complication (HCC)    Heart murmur    Hyperlipidemia    Hypertension    Thyroid disease     Social History   Socioeconomic History   Marital status: Single    Spouse name: Not on file   Number of children: 0   Years of education: Not on file   Highest education level: High school graduate   Occupational History   Not on file  Tobacco Use   Smoking status: Every Day    Packs/day: 1.00    Years: 41.00    Pack years: 41.00    Types: Cigarettes    Start date: 08/10/1976   Smokeless tobacco: Never   Tobacco comments:    cutting down, smoking half pack lately   Vaping Use   Vaping Use: Never used  Substance and Sexual Activity   Alcohol use: No    Alcohol/week: 0.0 standard drinks   Drug use: No   Sexual activity: Not Currently  Other Topics Concern   Not on file  Social History Narrative   Not on file   Social Determinants of Health   Financial Resource Strain: Low Risk    Difficulty of Paying Living Expenses: Not hard at all  Food Insecurity: No Food Insecurity   Worried About Charity fundraiser in the Last Year: Never true   Ran Out of Food in the Last Year: Never true  Transportation Needs: No Transportation Needs   Lack of Transportation (Medical): No   Lack of Transportation (Non-Medical): No  Physical Activity: Insufficiently Active   Days of  Exercise per Week: 4 days   Minutes of Exercise per Session: 30 min  Stress: No Stress Concern Present   Feeling of Stress : Not at all  Social Connections: Socially Isolated   Frequency of Communication with Friends and Family: More than three times a week   Frequency of Social Gatherings with Friends and Family: Once a week   Attends Religious Services: Never   Marine scientist or Organizations: No   Attends Music therapist: Never   Marital Status: Never married  Human resources officer Violence: Not At Risk   Fear of Current or Ex-Partner: No   Emotionally Abused: No   Physically Abused: No   Sexually Abused: No    Past Surgical History:  Procedure Laterality Date   ABDOMINAL HYSTERECTOMY  08/11/1999   still has ovaries   BREAST BIOPSY Right 09/05/2018   Affirm Bx- Ribbon clip- path pending   COLONOSCOPY  08/11/2015   COLONOSCOPY WITH PROPOFOL N/A 06/10/2016   Procedure: COLONOSCOPY WITH  PROPOFOL;  Surgeon: Christene Lye, MD;  Location: [facility] ENDOSCOPY;  Service: Endoscopy;  Laterality: N/A;    Family History  Problem Relation Age of Onset   Multiple sclerosis Mother    Diabetes Father    Breast cancer Sister 47   Other Sister        DDD - she had to have back surgery   Hypertension Maternal Grandmother    Thyroid disease Paternal Uncle    Breast cancer Paternal Aunt    Heart Problems Paternal Aunt     Allergies  Allergen Reactions   Hydrochlorothiazide     hyperparathyroidism   Pneumococcal Vaccine Rash   Pneumovax [Pneumococcal Polysaccharide Vaccine]     CBC Latest Ref Rng & Units 09/03/2020 03/22/2020 03/31/2019  WBC - 8.5 8.5 8.6  Hemoglobin 12.0 - 16.0 12.6 12.9 13.5  Hematocrit 36 - 46 39 40.4 41.9  Platelets 150 - 399 212 260 265      CMP     Component Value Date/Time   NA 140 09/03/2020 0000   NA 133 (L) 08/18/2014 1941   K 4.3 09/03/2020 0000   K 3.9 08/18/2014 1941   CL 107 03/22/2020 1159   CL 100 08/18/2014 1941   CO2 28 03/22/2020 1159   CO2 28 08/18/2014 1941   GLUCOSE 87 03/22/2020 1159   GLUCOSE 109 (H) 08/18/2014 1941   BUN 9 03/22/2020 1159   BUN 10 04/25/2015 0857   BUN 9 08/18/2014 1941   CREATININE 0.7 09/03/2020 0000   CREATININE 0.81 03/22/2020 1159   CALCIUM 10.7 (H) 03/22/2020 1159   CALCIUM 9.7 08/18/2014 1941   PROT 6.5 03/22/2020 1159   PROT 6.8 04/25/2015 0857   PROT 7.2 08/18/2014 1941   ALBUMIN 3.8 11/02/2016 1013   ALBUMIN 4.0 04/25/2015 0857   ALBUMIN 3.5 08/18/2014 1941   AST 9 (L) 03/22/2020 1159   AST 11 (L) 08/18/2014 1941   ALT 9 03/22/2020 1159   ALT 18 08/18/2014 1941   ALKPHOS 96 11/02/2016 1013   ALKPHOS 105 08/18/2014 1941   BILITOT 0.3 03/22/2020 1159   BILITOT 0.3 04/25/2015 0857   BILITOT 0.4 08/18/2014 1941   GFRNONAA 77 03/22/2020 1159   GFRAA 89 03/22/2020 1159     No results found.     Assessment & Plan:   1. Lymphedema  No surgery or intervention at this point in  time.    I have reviewed my previous discussion with the patient regarding swelling and  why it  causes symptoms.  The patient is doing well with compression and will continue wearing graduated compression stockings class 1 (20-30 mmHg) on a daily basis a prescription was given. The patient will  continue wearing the stockings first thing in the morning and removing them in the evening. The patient is instructed specifically not to sleep in the stockings.    In addition, behavioral modification including elevation during the day and exercise will be continued.    Patient should follow-up on an annual basis    2. Cigarette smoker Smoking cessation was discussed, 3-10 minutes spent on this topic specifically   3. Essential (primary) hypertension Continue antihypertensive medications as already ordered, these medications have been reviewed and there are no changes at this time.    Current Outpatient Medications on File Prior to Visit  Medication Sig Dispense Refill   acetaminophen (TYLENOL) 500 MG tablet Take 1 tablet (500 mg total) by mouth 2 (two) times daily. 30 tablet 0   amLODipine-benazepril (LOTREL) 5-40 MG capsule Take 1 capsule by mouth daily. 90 capsule 1   aspirin 81 MG tablet Take 1 tablet by mouth daily.     atorvastatin (LIPITOR) 40 MG tablet Take 1 tablet (40 mg total) by mouth daily. 90 tablet 1   Cholecalciferol (VITAMIN D) 2000 UNITS tablet Take 1 tablet by mouth daily.     ferrous sulfate 325 (65 FE) MG tablet Take 1 tablet by mouth daily.     Fluticasone-Umeclidin-Vilant (TRELEGY ELLIPTA) 100-62.5-25 MCG/INH AEPB Inhale 1 puff into the lungs daily. 60 each 5   furosemide (LASIX) 20 MG tablet Take 1 tablet (20 mg total) by mouth daily. 90 tablet 1   gabapentin (NEURONTIN) 300 MG capsule Take 1 capsule (300 mg total) by mouth 4 (four) times daily. 360 capsule 1   Ipratropium-Albuterol (COMBIVENT) 20-100 MCG/ACT AERS respimat Inhale 1 puff into the lungs every 6 (six) hours.      KLOR-CON M20 20 MEQ tablet Take 1 tablet by mouth once daily 90 tablet 0   levothyroxine (SYNTHROID) 137 MCG tablet TAKE 1 TABLET DAILY BEFORE BREAKFAST 6 DAYS PER WEEK, DO NOT TAKE ANY ON SUNDAYS 72 tablet 1   metoprolol succinate (TOPROL-XL) 100 MG 24 hr tablet Take 1 tablet (100 mg total) by mouth daily. Take with or immediately following a meal. 90 tablet 1   MULTIPLE VITAMINS-MINERALS ER PO Take 1 tablet by mouth daily.     naftifine (NAFTIN) 1 % cream APPLY  CREAM TOPICALLY ONCE DAILY 90 g 0   omega-3 fish oil (MAXEPA) 1000 MG CAPS capsule Take 1 capsule by mouth daily.     Polyethylene Glycol 3350 POWD Take 1 Dose by mouth as needed.     Semaglutide,0.25 or 0.'5MG'$ /DOS, (OZEMPIC, 0.25 OR 0.5 MG/DOSE,) 2 MG/1.5ML SOPN Inject 0.5 mg into the skin once a week. 1.5 mL 5   triamcinolone cream (KENALOG) 0.1 % Apply 1 application topically 2 (two) times daily. Mix with Eucerin or Nivea lotion ( at home  ) 453.6 g 0   No current facility-administered medications on file prior to visit.    There are no Patient Instructions on file for this visit. No follow-ups on file.   Kris Hartmann, NP

## 2021-05-02 ENCOUNTER — Telehealth: Payer: Self-pay

## 2021-05-02 NOTE — Telephone Encounter (Signed)
Left message stating time to schedule Colonoscopy-however Dr.Byrnette is no longer with our practice and when patient calls back we can give her number for Dr.Byrnette or place referral to her choice of Gastroenterologist.

## 2021-05-20 ENCOUNTER — Other Ambulatory Visit: Payer: Self-pay | Admitting: Family Medicine

## 2021-05-20 DIAGNOSIS — R600 Localized edema: Secondary | ICD-10-CM

## 2021-05-26 ENCOUNTER — Encounter: Payer: Self-pay | Admitting: General Surgery

## 2021-05-27 DIAGNOSIS — Z86010 Personal history of colonic polyps: Secondary | ICD-10-CM | POA: Diagnosis not present

## 2021-05-28 ENCOUNTER — Other Ambulatory Visit: Payer: Self-pay | Admitting: General Surgery

## 2021-05-28 NOTE — Progress Notes (Signed)
Subjective:     Patient ID: Charlene Hardy is a 65 y.o. female.   HPI   The following portions of the patient's history were reviewed and updated as appropriate.   This a new patient is here today for: office visit. Here to discuss having a colonoscopy, last completed 2017 by Dr Jamal Collin. She states her bowels move every other daly, no bleeding.   Review of Systems  Constitutional: Negative for chills and fever.  Respiratory: Negative for cough.          Chief Complaint  Patient presents with   Pre-op Exam      BP 128/70   Pulse 68   Temp 36.5 C (97.7 F)   Ht 170.2 cm ($RemoveB'5\' 7"'hepVSEJW$ )   Wt (!) 119.3 kg (263 lb)   SpO2 98%   BMI 41.19 kg/m        Past Medical History:  Diagnosis Date   Acute rheumatic fever     Arthritis     Bronchitis     Cardiac murmur     Diabetes (CMS-HCC)     Hyperlipidemia     Hypertension     Hypothyroid, unspecified     Obese             Past Surgical History:  Procedure Laterality Date   BREAST EXCISIONAL BIOPSY Right 09/05/2018   COLONOSCOPY   2017    Dr Jamal Collin   COLONOSCOPY   2007   HYSTERECTOMY VAGINAL   2000                OB History     Gravida  0   Para  0   Term  0   Preterm  0   AB  0   Living  0      SAB  0   IAB  0   Ectopic  0   Molar  0   Multiple  0   Live Births  0        Obstetric Comments  Age at first period 43               Social History           Socioeconomic History   Marital status: Single  Tobacco Use   Smoking status: Current Every Day Smoker      Packs/day: 0.50      Years: 40.00      Pack years: 20.00      Types: Cigarettes      Start date: 08/10/1976   Smokeless tobacco: Never Used  Substance and Sexual Activity   Alcohol use: No   Drug use: No   Sexual activity: Never            Allergies  Allergen Reactions   Hydrochlorothiazide Other (See Comments)   Pneumococcal Vaccine Rash      Current Medications        Current Outpatient Medications  Medication  Sig Dispense Refill   acetaminophen (TYLENOL) 500 MG tablet Take 500 mg by mouth every 6 (six) hours as needed for Pain.       amLODIPine-benazepril (LOTREL) 5-40 mg capsule Take 1 capsule by mouth once daily.       aspirin 81 MG EC tablet Take 81 mg by mouth once daily.       atorvastatin (LIPITOR) 40 MG tablet Take 40 mg by mouth once daily       cholecalciferol (VITAMIN D3) 2,000 unit tablet Take 2,000 Units by mouth  once daily.       ferrous sulfate 325 (65 FE) MG tablet Take 325 mg by mouth daily with breakfast.       FUROsemide (LASIX) 20 MG tablet Take 20 mg by mouth once daily.       gabapentin (NEURONTIN) 300 MG capsule Take 300 mg by mouth 3 (three) times daily.       ipratropium-albuterol (COMBIVENT RESPIMAT) 20-100 mcg/actuation inhaler Inhale 1 inhalation into the lungs 4 (four) times daily as needed       levothyroxine (SYNTHROID) 137 MCG tablet Take 137 mcg by mouth once daily Monday-Saturday.  Skip pill each week on Sunday       metoprolol succinate (TOPROL-XL) 200 MG XL tablet Take 100 mg by mouth once daily         multivitamin tablet Take 1 tablet by mouth once daily.       naftifine (NAFTIN) 1 % cream Apply 1 g topically 2 (two) times daily.       omega-3 fatty acids/fish oil 340-1,000 mg capsule Take 1 capsule by mouth once daily.       polyethylene glycol (MIRALAX) powder Take 17 g by mouth once daily as needed for Constipation Mix in 4-8ounces of fluid prior to taking.       potassium chloride (K-DUR,KLOR-CON) 20 MEQ ER tablet Take 20 mEq by mouth once daily.       semaglutide 0.25 mg or 0.5 mg(2 mg/1.5 mL) PnIj Inject subcutaneously       TRELEGY ELLIPTA 100-62.5-25 mcg inhaler Inhale 1 inhalation into the lungs once daily        No current facility-administered medications for this visit.             Family History  Problem Relation Age of Onset   Multiple sclerosis Mother     Diabetes Father     Breast cancer Sister     No Known Problems Brother     No Known  Problems Brother     High blood pressure (Hypertension) Maternal Grandmother     No Known Problems Maternal Grandfather     Diabetes Paternal Grandmother     No Known Problems Paternal Grandfather     Breast cancer Paternal Aunt     Colon cancer Neg Hx          Labs and Radiology:      June 10, 2016 pathology:   DIAGNOSIS:  A. COLON POLYP, DISTAL SIGMOID; COLD BIOPSY:  - TUBULAR ADENOMA.  - NEGATIVE FOR HIGH-GRADE DYSPLASIA AND MALIGNANCY.    GROSS DESCRIPTION:  A. Labeled: C BX distal sigmoid polyp  Tissue fragment(s): multiple  Size: aggregate, 0.6 x 0.3 x 0.1 cm  Description: pink-tan fragments      Colonoscopy report:   A moderate amount of stool was found in the cecum, interfering with visualization. Lavage of the area was performed, resulting in incomplete clearance with continued poor visualization. Cecum : Stool A 3 mm polyp was found in the distal sigmoid colon. The polyp was sessile. The polyp was removed with a cold biopsy forceps. Resection and retrieval were complete.   Dec 10, 2020 laboratory:   Glucose 70 - 110 mg/dL 86   Sodium 136 - 145 mmol/L 140   Potassium 3.6 - 5.1 mmol/L 4.3   Chloride 97 - 109 mmol/L 108   Carbon Dioxide (CO2) 22.0 - 32.0 mmol/L 28.3   Calcium 8.7 - 10.3 mg/dL 10.5 High    Urea Nitrogen (BUN) 7 - 25  mg/dL 12   Creatinine 0.6 - 1.1 mg/dL 0.7   Glomerular Filtration Rate (eGFR), MDRD Estimate >60 mL/min/1.73sq m 102     Component Ref Range & Units 5 mo ago  (12/10/20) 2 yr ago  (11/16/18) 4 yr ago  (09/14/16) 5 yr ago  (02/27/16) 5 yr ago  (09/13/15)     Parathyroid Hormone, Intact - [facility] 15 - 65 pg/mL 69 High   55  43  53  42          Objective:   Physical Exam Constitutional:      Appearance: Normal appearance.  Cardiovascular:     Rate and Rhythm: Normal rate and regular rhythm.     Pulses: Normal pulses.     Heart sounds: Murmur heard.   Systolic murmur is present with a grade of 2/6.    Comments: Normal cardiac  echo April 29, 2018.  Minimal aortic regurgitation.  Normal EF. Pulmonary:     Effort: Pulmonary effort is normal.     Breath sounds: Normal breath sounds.  Musculoskeletal:     Cervical back: Neck supple.  Skin:    General: Skin is warm and dry.  Neurological:     Mental Status: She is alert and oriented to person, place, and time.  Psychiatric:        Mood and Affect: Mood normal.        Behavior: Behavior normal.           Assessment:     History colon polyp.   Known hypercalcemia, elevated serum calcium        Plan:     The recommendations from 2017 and the update in 2020 were reviewed.  Appropriate to the repeat colonoscopy now or defer for an additional 2 years.  Patient reports she rather get it done and move on and I think this is reasonable.   She was instructed in regards to preparation by the staff.      This note is partially prepared by Karie Fetch, RN, acting as a scribe in the presence of Dr. Hervey Ard, MD.  The documentation recorded by the scribe accurately reflects the service I personally performed and the decisions made by me.    Robert Bellow, MD FACS

## 2021-06-11 ENCOUNTER — Ambulatory Visit: Payer: BC Managed Care – PPO | Admitting: Anesthesiology

## 2021-06-11 ENCOUNTER — Ambulatory Visit
Admission: RE | Admit: 2021-06-11 | Discharge: 2021-06-11 | Disposition: A | Discharge status: Home / Self Care | Payer: BC Managed Care – PPO | Attending: General Surgery | Admitting: General Surgery

## 2021-06-11 ENCOUNTER — Encounter
Admission: RE | Disposition: A | Discharge status: Home / Self Care | Payer: Self-pay | Source: Home / Self Care | Attending: General Surgery

## 2021-06-11 ENCOUNTER — Encounter: Payer: Self-pay | Admitting: General Surgery

## 2021-06-11 DIAGNOSIS — I10 Essential (primary) hypertension: Secondary | ICD-10-CM | POA: Insufficient documentation

## 2021-06-11 DIAGNOSIS — F17210 Nicotine dependence, cigarettes, uncomplicated: Secondary | ICD-10-CM | POA: Diagnosis not present

## 2021-06-11 DIAGNOSIS — E785 Hyperlipidemia, unspecified: Secondary | ICD-10-CM | POA: Diagnosis not present

## 2021-06-11 DIAGNOSIS — Z888 Allergy status to other drugs, medicaments and biological substances status: Secondary | ICD-10-CM | POA: Diagnosis not present

## 2021-06-11 DIAGNOSIS — Z7951 Long term (current) use of inhaled steroids: Secondary | ICD-10-CM | POA: Diagnosis not present

## 2021-06-11 DIAGNOSIS — Z7982 Long term (current) use of aspirin: Secondary | ICD-10-CM | POA: Insufficient documentation

## 2021-06-11 DIAGNOSIS — Z1211 Encounter for screening for malignant neoplasm of colon: Secondary | ICD-10-CM | POA: Insufficient documentation

## 2021-06-11 DIAGNOSIS — Z79899 Other long term (current) drug therapy: Secondary | ICD-10-CM | POA: Insufficient documentation

## 2021-06-11 DIAGNOSIS — E079 Disorder of thyroid, unspecified: Secondary | ICD-10-CM | POA: Insufficient documentation

## 2021-06-11 DIAGNOSIS — Z86010 Personal history of colonic polyps: Secondary | ICD-10-CM | POA: Insufficient documentation

## 2021-06-11 DIAGNOSIS — M1990 Unspecified osteoarthritis, unspecified site: Secondary | ICD-10-CM | POA: Diagnosis not present

## 2021-06-11 DIAGNOSIS — E119 Type 2 diabetes mellitus without complications: Secondary | ICD-10-CM | POA: Diagnosis not present

## 2021-06-11 DIAGNOSIS — Z79890 Hormone replacement therapy (postmenopausal): Secondary | ICD-10-CM | POA: Insufficient documentation

## 2021-06-11 HISTORY — PX: COLONOSCOPY WITH PROPOFOL: SHX5780

## 2021-06-11 LAB — GLUCOSE, CAPILLARY: Glucose-Capillary: 89 mg/dL (ref 70–99)

## 2021-06-11 SURGERY — COLONOSCOPY WITH PROPOFOL
Anesthesia: General

## 2021-06-11 MED ORDER — PROPOFOL 10 MG/ML IV BOLUS
INTRAVENOUS | Status: AC
  Filled 2021-06-11: qty 20

## 2021-06-11 MED ORDER — SODIUM CHLORIDE 0.9 % IV SOLN
INTRAVENOUS | Status: DC
  Administered 2021-06-11: 1000 mL via INTRAVENOUS

## 2021-06-11 MED ORDER — PROPOFOL 500 MG/50ML IV EMUL
INTRAVENOUS | Status: DC | PRN
  Administered 2021-06-11: 125 ug/kg/min via INTRAVENOUS

## 2021-06-11 MED ORDER — PROPOFOL 10 MG/ML IV BOLUS
INTRAVENOUS | Status: DC | PRN
  Administered 2021-06-11: 80 mg via INTRAVENOUS

## 2021-06-11 NOTE — Anesthesia Preprocedure Evaluation (Signed)
Anesthesia Evaluation  Patient identified by MRN, date of birth, ID band Patient awake    Reviewed: Allergy & Precautions, NPO status , Patient's Chart, lab work & pertinent test results  History of Anesthesia Complications Negative for: history of anesthetic complications  Airway Mallampati: II  TM Distance: >3 FB Neck ROM: Full    Dental  (+) Poor Dentition   Pulmonary neg sleep apnea, neg COPD, Current SmokerPatient did not abstain from smoking.,    breath sounds clear to auscultation- rhonchi (-) wheezing      Cardiovascular Exercise Tolerance: Good hypertension, Pt. on medications (-) CAD, (-) Past MI, (-) Cardiac Stents and (-) CABG  Rhythm:Regular Rate:Normal - Systolic murmurs and - Diastolic murmurs    Neuro/Psych neg Seizures negative neurological ROS  negative psych ROS   GI/Hepatic negative GI ROS, Neg liver ROS,   Endo/Other  diabetesHypothyroidism   Renal/GU negative Renal ROS     Musculoskeletal  (+) Arthritis ,   Abdominal (+) + obese,   Peds  Hematology negative hematology ROS (+)   Anesthesia Other Findings Past Medical History: No date: Arthritis No date: Diabetes mellitus without complication (HCC) No date: Heart murmur No date: Hyperlipidemia No date: Hypertension No date: Thyroid disease   Reproductive/Obstetrics                             Anesthesia Physical Anesthesia Plan  ASA: 3  Anesthesia Plan: General   Post-op Pain Management:    Induction: Intravenous  PONV Risk Score and Plan: 1 and Propofol infusion  Airway Management Planned: Natural Airway  Additional Equipment:   Intra-op Plan:   Post-operative Plan:   Informed Consent: I have reviewed the patients History and Physical, chart, labs and discussed the procedure including the risks, benefits and alternatives for the proposed anesthesia with the patient or authorized representative who  has indicated his/her understanding and acceptance.     Dental advisory given  Plan Discussed with: CRNA and Anesthesiologist  Anesthesia Plan Comments:         Anesthesia Quick Evaluation

## 2021-06-11 NOTE — Op Note (Signed)
[facility] Patient Name: Charlene Hardy Procedure Date: 06/11/2021 10:53 AM MRN: [id number] Account #: [id number] Date of Birth: March 09, 1956 Admit Type: Outpatient Age: 65 Room: [facility] ENDO ROOM 1 Gender: Female Note Status: Finalized Instrument Name: Peds Colonoscope [id number] Procedure:             Colonoscopy Indications:           High risk colon cancer surveillance: Personal history                         of colonic polyps Providers:             Robert Bellow, MD Referring MD:          Bethena Roys. Sowles, MD (Referring MD) Medicines:             Propofol per Anesthesia Complications:         No immediate complications. Procedure:             Pre-Anesthesia Assessment:                        - Prior to the procedure, a History and Physical was                         performed, and patient medications, allergies and                         sensitivities were reviewed. The patient's tolerance                         of previous anesthesia was reviewed.                        - The risks and benefits of the procedure and the                         sedation options and risks were discussed with the                         patient. All questions were answered and informed                         consent was obtained.                        After obtaining informed consent, the colonoscope was                         passed under direct vision. Throughout the procedure,                         the patient's blood pressure, pulse, and oxygen                         saturations were monitored continuously. The                         Colonoscope was introduced through the anus and  advanced to the the cecum, identified by appendiceal                         orifice and ileocecal valve. The colonoscopy was                         performed without difficulty. The patient tolerated                         the procedure well.  The quality of the bowel                         preparation was excellent. Findings:      The entire examined colon appeared normal on direct and retroflexion       views. Impression:            - The entire examined colon is normal on direct and                         retroflexion views.                        - No specimens collected. Recommendation:        - Repeat colonoscopy in 7 years for surveillance. Procedure Code(s):     --- Professional ---                        [phone], Colonoscopy, flexible; diagnostic, including                         collection of specimen(s) by brushing or washing, when                         performed (separate procedure) Diagnosis Code(s):     --- Professional ---                        Z86.010, Personal history of colonic polyps CPT copyright 2019 [employer]. All rights reserved. The codes documented in this report are preliminary and upon coder review may  be revised to meet current compliance requirements. Robert Bellow, MD 06/11/2021 11:27:09 AM This report has been signed electronically. Number of Addenda: 0 Note Initiated On: 06/11/2021 10:53 AM Scope Withdrawal Time: 0 hours 10 minutes 54 seconds  Total Procedure Duration: 0 hours 18 minutes 1 second  Estimated Blood Loss:  Estimated blood loss: none.      [facility]

## 2021-06-11 NOTE — Anesthesia Postprocedure Evaluation (Signed)
Anesthesia Post Note  Patient: Charlene Hardy  Procedure(s) Performed: COLONOSCOPY WITH PROPOFOL  Patient location during evaluation: Endoscopy Anesthesia Type: General Level of consciousness: awake and alert and oriented Pain management: pain level controlled Vital Signs Assessment: post-procedure vital signs reviewed and stable Respiratory status: spontaneous breathing, nonlabored ventilation and respiratory function stable Cardiovascular status: blood pressure returned to baseline and stable Postop Assessment: no signs of nausea or vomiting Anesthetic complications: no   No notable events documented.   Last Vitals:  Vitals:   06/11/21 1150 06/11/21 1200  BP: 135/69 (!) 152/68  Pulse: (!) 58 (!) 59  Resp: (!) 24 14  Temp:    SpO2: 98% 100%    Last Pain:  Vitals:   06/11/21 1200  TempSrc:   PainSc: 0-No pain                 Sherre Wooton

## 2021-06-11 NOTE — H&P (Signed)
Charlene Hardy [id number] [birth year]     HPI: Healthy 65 y/o woman identified with a small sigmoid colon tubular adenoma in 2017. For repeat endoscopy.  Tolerated prep well.   Medications Prior to Admission  Medication Sig Dispense Refill Last Dose   acetaminophen (TYLENOL) 500 MG tablet Take 1 tablet (500 mg total) by mouth 2 (two) times daily. 30 tablet 0 06/10/2021 at prn   amLODipine-benazepril (LOTREL) 5-40 MG capsule Take 1 capsule by mouth daily. 90 capsule 1 06/10/2021   aspirin 81 MG tablet Take 1 tablet by mouth daily.   06/10/2021   atorvastatin (LIPITOR) 40 MG tablet Take 1 tablet (40 mg total) by mouth daily. 90 tablet 1 06/10/2021   Cholecalciferol (VITAMIN D) 2000 UNITS tablet Take 1 tablet by mouth daily.   06/10/2021   ferrous sulfate 325 (65 FE) MG tablet Take 1 tablet by mouth daily.   Past Week   Fluticasone-Umeclidin-Vilant (TRELEGY ELLIPTA) 100-62.5-25 MCG/INH AEPB Inhale 1 puff into the lungs daily. 60 each 5 06/10/2021   furosemide (LASIX) 20 MG tablet Take 1 tablet (20 mg total) by mouth daily. 90 tablet 1 06/10/2021   gabapentin (NEURONTIN) 300 MG capsule Take 1 capsule (300 mg total) by mouth 4 (four) times daily. 360 capsule 1 06/10/2021   Ipratropium-Albuterol (COMBIVENT) 20-100 MCG/ACT AERS respimat Inhale 1 puff into the lungs every 6 (six) hours.   06/10/2021   levothyroxine (SYNTHROID) 137 MCG tablet TAKE 1 TABLET DAILY BEFORE BREAKFAST 6 DAYS PER WEEK, DO NOT TAKE ANY ON SUNDAYS 72 tablet 1 06/10/2021   metoprolol succinate (TOPROL-XL) 100 MG 24 hr tablet Take 1 tablet (100 mg total) by mouth daily. Take with or immediately following a meal. 90 tablet 1 06/10/2021   MULTIPLE VITAMINS-MINERALS ER PO Take 1 tablet by mouth daily.   06/10/2021   naftifine (NAFTIN) 1 % cream APPLY  CREAM TOPICALLY ONCE DAILY 90 g 0 06/10/2021   omega-3 fish oil (MAXEPA) 1000 MG CAPS capsule Take 1 capsule by mouth daily.   Past Week   Polyethylene Glycol 3350 POWD Take 1 Dose by mouth as  needed.   06/10/2021   potassium chloride SA (KLOR-CON) 20 MEQ tablet Take 1 tablet by mouth once daily 90 tablet 1 06/10/2021   Semaglutide,0.25 or 0.5MG /DOS, (OZEMPIC, 0.25 OR 0.5 MG/DOSE,) 2 MG/1.5ML SOPN Inject 0.5 mg into the skin once a week. 1.5 mL 5 06/10/2021   triamcinolone cream (KENALOG) 0.1 % Apply 1 application topically 2 (two) times daily. Mix with Eucerin or Nivea lotion ( at home  ) 453.6 g 0 06/10/2021   Allergies  Allergen Reactions   Hydrochlorothiazide     hyperparathyroidism   Pneumococcal Vaccine Rash   Pneumovax [Pneumococcal Polysaccharide Vaccine]    Past Medical History:  Diagnosis Date   Arthritis    Diabetes mellitus without complication ([place])    Heart murmur    Hyperlipidemia    Hypertension    Thyroid disease    Past Surgical History:  Procedure Laterality Date   ABDOMINAL HYSTERECTOMY  08/11/1999   still has ovaries   BREAST BIOPSY Right 09/05/2018   Affirm Bx- Ribbon clip- path pending   COLONOSCOPY  08/11/2015   COLONOSCOPY WITH PROPOFOL N/A 06/10/2016   Procedure: COLONOSCOPY WITH PROPOFOL;  Surgeon: Christene Lye, MD;  Location: [facility] ENDOSCOPY;  Service: Endoscopy;  Laterality: N/A;   Social History   Socioeconomic History   Marital status: Single    Spouse name: Not on file   Number of children: 0  Years of education: Not on file   Highest education level: High school graduate  Occupational History   Not on file  Tobacco Use   Smoking status: Every Day    Packs/day: 1.00    Years: 41.00    Pack years: 41.00    Types: Cigarettes    Start date: 08/10/1976   Smokeless tobacco: Never   Tobacco comments:    cutting down, smoking half pack lately   Vaping Use   Vaping Use: Never used  Substance and Sexual Activity   Alcohol use: No    Alcohol/week: 0.0 standard drinks   Drug use: No   Sexual activity: Not Currently  Other Topics Concern   Not on file  Social History Narrative   Not on file   Social Determinants of Health    Financial Resource Strain: Low Risk    Difficulty of Paying Living Expenses: Not hard at all  Food Insecurity: No Food Insecurity   Worried About Charity fundraiser in the Last Year: Never true   [place] in the Last Year: Never true  Transportation Needs: No Transportation Needs   Lack of Transportation (Medical): No   Lack of Transportation (Non-Medical): No  Physical Activity: Insufficiently Active   Days of Exercise per Week: 4 days   Minutes of Exercise per Session: 30 min  Stress: No Stress Concern Present   Feeling of Stress : Not at all  Social Connections: Socially Isolated   Frequency of Communication with Friends and Family: More than three times a week   Frequency of Social Gatherings with Friends and Family: Once a week   Attends Religious Services: Never   Marine scientist or Organizations: No   Attends Music therapist: Never   Marital Status: Never married  Human resources officer Violence: Not At Risk   Fear of Current or Ex-Partner: No   Emotionally Abused: No   Physically Abused: No   Sexually Abused: No   Social History   Social History Narrative   Not on file     ROS: Negative.     PE: HEENT: Negative. Lungs: Clear. Cardio: RR.  Assessment/Plan:  Proceed with planned endoscopy.   Forest Gleason [facility] 06/11/2021

## 2021-06-11 NOTE — Transfer of Care (Signed)
Immediate Anesthesia Transfer of Care Note  Patient: Jayma Volpi  Procedure(s) Performed: COLONOSCOPY WITH PROPOFOL  Patient Location: PACU  Anesthesia Type:General  Level of Consciousness: awake and alert   Airway & Oxygen Therapy: Patient Spontanous Breathing and Patient connected to nasal cannula oxygen  Post-op Assessment: Report given to RN and Post -op Vital signs reviewed and stable  Post vital signs: Reviewed and stable  Last Vitals:  Vitals Value Taken Time  BP    Temp    Pulse    Resp 25 06/11/21 1133  SpO2    Vitals shown include unvalidated device data.  Last Pain:  Vitals:   06/11/21 1027  TempSrc: Temporal  PainSc: 0-No pain         Complications: No notable events documented.

## 2021-06-12 ENCOUNTER — Encounter: Payer: Self-pay | Admitting: General Surgery

## 2021-08-20 ENCOUNTER — Other Ambulatory Visit: Payer: Self-pay | Admitting: Family Medicine

## 2021-08-20 DIAGNOSIS — Z1231 Encounter for screening mammogram for malignant neoplasm of breast: Secondary | ICD-10-CM

## 2021-09-24 ENCOUNTER — Other Ambulatory Visit: Payer: Self-pay

## 2021-09-24 ENCOUNTER — Ambulatory Visit
Admission: RE | Admit: 2021-09-24 | Discharge: 2021-09-24 | Disposition: A | Discharge status: Home / Self Care | Payer: BC Managed Care – PPO | Source: Ambulatory Visit | Attending: Family Medicine | Admitting: Family Medicine

## 2021-09-24 DIAGNOSIS — Z1231 Encounter for screening mammogram for malignant neoplasm of breast: Secondary | ICD-10-CM | POA: Diagnosis not present

## 2021-10-13 ENCOUNTER — Other Ambulatory Visit: Payer: Self-pay | Admitting: Family Medicine

## 2021-10-13 DIAGNOSIS — Z122 Encounter for screening for malignant neoplasm of respiratory organs: Secondary | ICD-10-CM

## 2021-10-13 DIAGNOSIS — Z716 Tobacco abuse counseling: Secondary | ICD-10-CM

## 2021-10-17 ENCOUNTER — Ambulatory Visit: Payer: BC Managed Care – PPO | Admitting: Family Medicine

## 2021-10-20 NOTE — Patient Instructions (Signed)
Preventive Care 34 Years and Older, Female ?Preventive care refers to lifestyle choices and visits with your health care provider that can promote health and wellness. Preventive care visits are also called wellness exams. ?What can I expect for my preventive care visit? ?Counseling ?Your health care provider may ask you questions about your: ?Medical history, including: ?Past medical problems. ?Family medical history. ?Pregnancy and menstrual history. ?History of falls. ?Current health, including: ?Memory and ability to understand (cognition). ?Emotional well-being. ?Home life and relationship well-being. ?Sexual activity and sexual health. ?Lifestyle, including: ?Alcohol, nicotine or tobacco, and drug use. ?Access to firearms. ?Diet, exercise, and sleep habits. ?Work and work Statistician. ?Sunscreen use. ?Safety issues such as seatbelt and bike helmet use. ?Physical exam ?Your health care provider will check your: ?Height and weight. These may be used to calculate your BMI (body mass index). BMI is a measurement that tells if you are at a healthy weight. ?Waist circumference. This measures the distance around your waistline. This measurement also tells if you are at a healthy weight and may help predict your risk of certain diseases, such as type 2 diabetes and high blood pressure. ?Heart rate and blood pressure. ?Body temperature. ?Skin for abnormal spots. ?What immunizations do I need? ?Vaccines are usually given at various ages, according to a schedule. Your health care provider will recommend vaccines for you based on your age, medical history, and lifestyle or other factors, such as travel or where you work. ?What tests do I need? ?Screening ?Your health care provider may recommend screening tests for certain conditions. This may include: ?Lipid and cholesterol levels. ?Hepatitis C test. ?Hepatitis B test. ?HIV (human immunodeficiency virus) test. ?STI (sexually transmitted infection) testing, if you are at  risk. ?Lung cancer screening. ?Colorectal cancer screening. ?Diabetes screening. This is done by checking your blood sugar (glucose) after you have not eaten for a while (fasting). ?Mammogram. Talk with your health care provider about how often you should have regular mammograms. ?BRCA-related cancer screening. This may be done if you have a family history of breast, ovarian, tubal, or peritoneal cancers. ?Bone density scan. This is done to screen for osteoporosis. ?Talk with your health care provider about your test results, treatment options, and if necessary, the need for more tests. ?Follow these instructions at home: ?Eating and drinking ? ?Eat a diet that includes fresh fruits and vegetables, whole grains, lean protein, and low-fat dairy products. Limit your intake of foods with high amounts of sugar, saturated fats, and salt. ?Take vitamin and mineral supplements as recommended by your health care provider. ?Do not drink alcohol if your health care provider tells you not to drink. ?If you drink alcohol: ?Limit how much you have to 0-1 drink a day. ?Know how much alcohol is in your drink. In the [place] one drink equals one 12 oz bottle of beer (355 mL), one 5 oz glass of wine (148 mL), or one 1? oz glass of hard liquor (44 mL). ?Lifestyle ?Brush your teeth every morning and night with fluoride toothpaste. Floss one time each day. ?Exercise for at least 30 minutes 5 or more days each week. ?Do not use any products that contain nicotine or tobacco. These products include cigarettes, chewing tobacco, and vaping devices, such as e-cigarettes. If you need help quitting, ask your health care provider. ?Do not use drugs. ?If you are sexually active, practice safe sex. Use a condom or other form of protection in order to prevent STIs. ?Take aspirin only as told by your  health care provider. Make sure that you understand how much to take and what form to take. Work with your health care provider to find out whether it  is safe and beneficial for you to take aspirin daily. ?Ask your health care provider if you need to take a cholesterol-lowering medicine (statin). ?Find healthy ways to manage stress, such as: ?Meditation, yoga, or listening to music. ?Journaling. ?Talking to a trusted person. ?Spending time with friends and family. ?Minimize exposure to UV radiation to reduce your risk of skin cancer. ?Safety ?Always wear your seat belt while driving or riding in a vehicle. ?Do not drive: ?If you have been drinking alcohol. Do not ride with someone who has been drinking. ?When you are tired or distracted. ?While texting. ?If you have been using any mind-altering substances or drugs. ?Wear a helmet and other protective equipment during sports activities. ?If you have firearms in your house, make sure you follow all gun safety procedures. ?What's next? ?Visit your health care provider once a year for an annual wellness visit. ?Ask your health care provider how often you should have your eyes and teeth checked. ?Stay up to date on all vaccines. ?This information is not intended to replace advice given to you by your health care provider. Make sure you discuss any questions you have with your health care provider. ?Document Revised: 01/22/2021 Document Reviewed: 01/22/2021 ?Elsevier Patient Education ? [place]. ? ?

## 2021-10-20 NOTE — Progress Notes (Unsigned)
Name: Charlene Hardy   MRN: [id number]    DOB: 1956/02/25   Date:10/20/2021       Progress Note  Subjective  Chief Complaint  Annual Exam  HPI  Patient presents for annual CPE.  Diet: *** Exercise: ***   Flowsheet Row Office Visit from 03/22/2020 in [facility]  AUDIT-C Score 0      Depression: Phq 9 is  {Desc; negative/positive:13464} Depression screen [facility] 2/9 04/18/2021 10/16/2020 06/25/2020 03/22/2020 01/02/2020  Decreased Interest 0 0 0 0 0  Hardy, Depressed, Hopeless 0 0 0 0 0  PHQ - 2 Score 0 0 0 0 0  Altered sleeping - 0 0 0 0  Tired, decreased energy - 0 0 0 0  Change in appetite - 0 0 0 0  Feeling bad or failure about yourself  - 0 0 0 0  Trouble concentrating - 0 0 0 0  Moving slowly or fidgety/restless - 0 0 0 0  Suicidal thoughts - 0 0 0 0  PHQ-9 Score - 0 0 0 0  Difficult doing work/chores - - - Not difficult at all Not difficult at all  Some recent data might be hidden   Hypertension: BP Readings from Last 3 Encounters:  06/11/21 (!) 152/68  04/25/21 (!) 153/76  04/18/21 136/72   Obesity: Wt Readings from Last 3 Encounters:  06/11/21 260 lb 8.6 oz (118.2 kg)  04/25/21 260 lb 12.8 oz (118.3 kg)  04/18/21 260 lb (117.9 kg)   BMI Readings from Last 3 Encounters:  06/11/21 40.81 kg/m  04/25/21 40.85 kg/m  04/18/21 40.72 kg/m     Vaccines:   HPV: N/A Tdap: up to date Shingrix: up to date Pneumonia: up to date Flu: up to date COVID-69: up to date   Hep C Screening: 04/05/12 STD testing and prevention (HIV/chl/gon/syphilis): 04/25/15 Intimate partner violence: negative Sexual History : Menstrual History/LMP/Abnormal Bleeding:  Discussed importance of follow up if any post-menopausal bleeding: {Response; yes/no/na:63} Incontinence Symptoms: {yes no:314532}  Breast cancer:  - Last Mammogram: 09/24/21 - BRCA gene screening: N/A  Osteoporosis Prevention : Discussed high calcium and vitamin D supplementation, weight bearing  exercises Bone density :{Response; yes/no/na:63}  Cervical cancer screening: N/A  Skin cancer: Discussed monitoring for atypical lesions  Colorectal cancer: 06/11/21   Lung cancer:  Low Dose CT Chest recommended if Age 6-80 years, 20 pack-year currently smoking OR have quit w/in 15years. Patient does not qualify.   ECG: 04/30/20  Advanced Care Planning: A voluntary discussion about advance care planning including the explanation and discussion of advance directives.  Discussed health care proxy and Living will, and the patient was able to identify a health care proxy as ***.  Patient does not   Lipids: Lab Results  Component Value Date   CHOL 97 09/03/2020   CHOL 107 03/22/2020   CHOL 142 03/31/2019   Lab Results  Component Value Date   HDL 33 (A) 09/03/2020   HDL 37 (L) 03/22/2020   HDL 41 (L) 03/31/2019   Lab Results  Component Value Date   LDLCALC 45 09/03/2020   LDLCALC 52 03/22/2020   LDLCALC 81 03/31/2019   Lab Results  Component Value Date   TRIG 105 09/03/2020   TRIG 96 03/22/2020   TRIG 102 03/31/2019   Lab Results  Component Value Date   CHOLHDL 2.9 03/22/2020   CHOLHDL 3.5 03/31/2019   CHOLHDL 3.2 02/24/2018   No results found for: LDLDIRECT  Glucose: Glucose  Date Value Ref Range  Status  08/18/2014 109 (H) 65 - 99 mg/dL Final   Glucose, Bld  Date Value Ref Range Status  03/22/2020 87 65 - 99 mg/dL Final    Comment:    .            Fasting reference interval .   03/31/2019 93 65 - 99 mg/dL Final    Comment:    .            Fasting reference interval .   05/31/2018 89 65 - 99 mg/dL Final    Comment:    .            Fasting reference interval .    Glucose-Capillary  Date Value Ref Range Status  06/11/2021 89 70 - 99 mg/dL Final    Comment:    Glucose reference range applies only to samples taken after fasting for at least 8 hours.  06/10/2016 85 65 - 99 mg/dL Final    Patient Active Problem List   Diagnosis Date Noted    Lymphedema 07/24/2020   Chronic venous insufficiency 07/24/2020   Morbid obesity ([place]) 09/13/2018   Mild concentric left ventricular hypertrophy (LVH) 05/31/2018   Carotid atherosclerosis, left 05/31/2018   Primary hyperparathyroidism ([place]) 02/28/2016   Arthralgia of both knees 10/23/2015   Chronic bronchitis ([place]) 10/23/2015   Osteopenia 10/23/2015   Left ankle pain 04/25/2015   Constipation 04/25/2015   Allergic rhinitis 01/20/2015   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/2015   Diabetic sensorimotor neuropathy ([place]) 01/20/2015   Dyslipidemia 01/20/2015   Essential (primary) hypertension 01/20/2015   H/O iron deficiency anemia 01/20/2015   Hypertensive retinopathy 01/20/2015   Adult hypothyroidism 01/20/2015   Extreme obesity 01/20/2015   Background retinopathy due to secondary diabetes ([place]) 01/20/2015   Tinea pedis 01/20/2015   Vitamin D deficiency 02/26/2010   Cigarette smoker 03/06/2008    Past Surgical History:  Procedure Laterality Date   ABDOMINAL HYSTERECTOMY  08/11/1999   still has ovaries   BREAST BIOPSY Right 09/05/2018   Affirm Bx- Ribbon clip- path pending   COLONOSCOPY  08/11/2015   COLONOSCOPY WITH PROPOFOL N/A 06/10/2016   Procedure: COLONOSCOPY WITH PROPOFOL;  Surgeon: Christene Lye, MD;  Location: [facility] ENDOSCOPY;  Service: Endoscopy;  Laterality: N/A;   COLONOSCOPY WITH PROPOFOL N/A 06/11/2021   Procedure: COLONOSCOPY WITH PROPOFOL;  Surgeon: Robert Bellow, MD;  Location: [facility] ENDOSCOPY;  Service: Endoscopy;  Laterality: N/A;    Family History  Problem Relation Age of Onset   Multiple sclerosis Mother    Diabetes Father    Breast cancer Sister 46   Other Sister        DDD - she had to have back surgery   Hypertension Maternal Grandmother    Thyroid disease Paternal Uncle    Breast cancer Paternal Aunt    Heart Problems Paternal Aunt     Social History   Socioeconomic History   Marital status: Single    Spouse name: Not on file   Number of  children: 0   Years of education: Not on file   Highest education level: High school graduate  Occupational History   Not on file  Tobacco Use   Smoking status: Every Day    Packs/day: 1.00    Years: 41.00    Pack years: 41.00    Types: Cigarettes    Start date: 08/10/1976   Smokeless tobacco: Never   Tobacco comments:    cutting Hardy, smoking half pack lately   Vaping Use  Vaping Use: Never used  Substance and Sexual Activity   Alcohol use: No    Alcohol/week: 0.0 standard drinks   Drug use: No   Sexual activity: Not Currently  Other Topics Concern   Not on file  Social History Narrative   Not on file   Social Determinants of Health   Financial Resource Strain: Not on file  Food Insecurity: Not on file  Transportation Needs: Not on file  Physical Activity: Not on file  Stress: Not on file  Social Connections: Not on file  Intimate Partner Violence: Not on file     Current Outpatient Medications:    acetaminophen (TYLENOL) 500 MG tablet, Take 1 tablet (500 mg total) by mouth 2 (two) times daily., Disp: 30 tablet, Rfl: 0   amLODipine-benazepril (LOTREL) 5-40 MG capsule, Take 1 capsule by mouth daily., Disp: 90 capsule, Rfl: 1   aspirin 81 MG tablet, Take 1 tablet by mouth daily., Disp: , Rfl:    atorvastatin (LIPITOR) 40 MG tablet, Take 1 tablet (40 mg total) by mouth daily., Disp: 90 tablet, Rfl: 1   Cholecalciferol (VITAMIN D) 2000 UNITS tablet, Take 1 tablet by mouth daily., Disp: , Rfl:    ferrous sulfate 325 (65 FE) MG tablet, Take 1 tablet by mouth daily., Disp: , Rfl:    Fluticasone-Umeclidin-Vilant (TRELEGY ELLIPTA) 100-62.5-25 MCG/INH AEPB, Inhale 1 puff into the lungs daily., Disp: 60 each, Rfl: 5   furosemide (LASIX) 20 MG tablet, Take 1 tablet (20 mg total) by mouth daily., Disp: 90 tablet, Rfl: 1   gabapentin (NEURONTIN) 300 MG capsule, Take 1 capsule (300 mg total) by mouth 4 (four) times daily., Disp: 360 capsule, Rfl: 1   Ipratropium-Albuterol  (COMBIVENT) 20-100 MCG/ACT AERS respimat, Inhale 1 puff into the lungs every 6 (six) hours., Disp: , Rfl:    levothyroxine (SYNTHROID) 137 MCG tablet, TAKE 1 TABLET DAILY BEFORE BREAKFAST 6 DAYS PER WEEK, DO NOT TAKE ANY ON SUNDAYS, Disp: 72 tablet, Rfl: 1   metoprolol succinate (TOPROL-XL) 100 MG 24 hr tablet, Take 1 tablet (100 mg total) by mouth daily. Take with or immediately following a meal., Disp: 90 tablet, Rfl: 1   MULTIPLE VITAMINS-MINERALS ER PO, Take 1 tablet by mouth daily., Disp: , Rfl:    naftifine (NAFTIN) 1 % cream, APPLY  CREAM TOPICALLY ONCE DAILY, Disp: 90 g, Rfl: 0   omega-3 fish oil (MAXEPA) 1000 MG CAPS capsule, Take 1 capsule by mouth daily., Disp: , Rfl:    Polyethylene Glycol 3350 POWD, Take 1 Dose by mouth as needed., Disp: , Rfl:    potassium chloride SA (KLOR-CON) 20 MEQ tablet, Take 1 tablet by mouth once daily, Disp: 90 tablet, Rfl: 1   Semaglutide,0.25 or 0.5MG/DOS, (OZEMPIC, 0.25 OR 0.5 MG/DOSE,) 2 MG/1.5ML SOPN, Inject 0.5 mg into the skin once a week., Disp: 1.5 mL, Rfl: 5   triamcinolone cream (KENALOG) 0.1 %, Apply 1 application topically 2 (two) times daily. Mix with Eucerin or Nivea lotion ( at home  ), Disp: 453.6 g, Rfl: 0  Allergies  Allergen Reactions   Hydrochlorothiazide     hyperparathyroidism   Pneumococcal Vaccine Rash   Pneumovax [Pneumococcal Polysaccharide Vaccine]      ROS  ***  Objective  There were no vitals filed for this visit.  There is no height or weight on file to calculate BMI.  Physical Exam ***  No results found for this or any previous visit (from the past 2160 hour(s)).   Fall Risk: Fall  Risk  04/18/2021 10/16/2020 06/25/2020 03/22/2020 01/02/2020  Falls in the past year? 0 0 0 0 1  Comment - - - - -  Number falls in past yr: 0 0 0 0 1  Injury with Fall? 0 0 0 0 0  Risk for fall due to : No Fall Risks - - - -  Follow up Falls prevention discussed - - - -     Functional Status Survey:     Assessment &  Plan  1. Well adult exam ***   -USPSTF grade A and B recommendations reviewed with patient; age-appropriate recommendations, preventive care, screening tests, etc discussed and encouraged; healthy living encouraged; see AVS for patient education given to patient -Discussed importance of 150 minutes of physical activity weekly, eat two servings of fish weekly, eat one serving of tree nuts ( cashews, pistachios, pecans, almonds.Marland Kitchen) every other day, eat 6 servings of fruit/vegetables daily and drink plenty of water and avoid sweet beverages.   -Reviewed Health Maintenance: {yes KV:[id number]}

## 2021-10-21 ENCOUNTER — Encounter: Payer: Self-pay | Admitting: Family Medicine

## 2021-10-21 ENCOUNTER — Ambulatory Visit (INDEPENDENT_AMBULATORY_CARE_PROVIDER_SITE_OTHER): Payer: BC Managed Care – PPO | Admitting: Family Medicine

## 2021-10-21 VITALS — BP 134/86 | HR 86 | Resp 16 | Ht 67.0 in | Wt 258.0 lb

## 2021-10-21 DIAGNOSIS — R600 Localized edema: Secondary | ICD-10-CM

## 2021-10-21 DIAGNOSIS — Z23 Encounter for immunization: Secondary | ICD-10-CM

## 2021-10-21 DIAGNOSIS — J410 Simple chronic bronchitis: Secondary | ICD-10-CM

## 2021-10-21 DIAGNOSIS — E1140 Type 2 diabetes mellitus with diabetic neuropathy, unspecified: Secondary | ICD-10-CM | POA: Diagnosis not present

## 2021-10-21 DIAGNOSIS — E213 Hyperparathyroidism, unspecified: Secondary | ICD-10-CM

## 2021-10-21 DIAGNOSIS — Z0000 Encounter for general adult medical examination without abnormal findings: Secondary | ICD-10-CM | POA: Diagnosis not present

## 2021-10-21 DIAGNOSIS — Z13820 Encounter for screening for osteoporosis: Secondary | ICD-10-CM

## 2021-10-21 DIAGNOSIS — E039 Hypothyroidism, unspecified: Secondary | ICD-10-CM

## 2021-10-21 DIAGNOSIS — E1169 Type 2 diabetes mellitus with other specified complication: Secondary | ICD-10-CM

## 2021-10-21 DIAGNOSIS — I10 Essential (primary) hypertension: Secondary | ICD-10-CM

## 2021-10-21 DIAGNOSIS — E2839 Other primary ovarian failure: Secondary | ICD-10-CM | POA: Diagnosis not present

## 2021-10-21 DIAGNOSIS — E785 Hyperlipidemia, unspecified: Secondary | ICD-10-CM

## 2021-10-21 MED ORDER — FUROSEMIDE 20 MG PO TABS: 20.0000 mg | ORAL_TABLET | Freq: Every day | ORAL | 1 refills | Status: DC

## 2021-10-21 MED ORDER — GABAPENTIN 300 MG PO CAPS: 300.0000 mg | ORAL_CAPSULE | Freq: Four times a day (QID) | ORAL | 1 refills | Status: DC

## 2021-10-21 MED ORDER — FLUTICASONE-UMECLIDIN-VILANT 100-62.5-25 MCG/ACT IN AEPB: 1.0000 | INHALATION_SPRAY | Freq: Every day | RESPIRATORY_TRACT | 5 refills | Status: DC

## 2021-10-21 MED ORDER — SEMAGLUTIDE (1 MG/DOSE) 4 MG/3ML ~~LOC~~ SOPN: 1.0000 mg | PEN_INJECTOR | SUBCUTANEOUS | 1 refills | Status: DC

## 2021-10-21 MED ORDER — ATORVASTATIN CALCIUM 40 MG PO TABS: 40.0000 mg | ORAL_TABLET | Freq: Every day | ORAL | 1 refills | Status: DC

## 2021-10-21 MED ORDER — AMLODIPINE BESY-BENAZEPRIL HCL 5-40 MG PO CAPS: 1.0000 | ORAL_CAPSULE | Freq: Every day | ORAL | 1 refills | Status: DC

## 2021-10-21 MED ORDER — METOPROLOL SUCCINATE ER 100 MG PO TB24: 100.0000 mg | ORAL_TABLET | Freq: Every day | ORAL | 1 refills | Status: DC

## 2021-10-22 LAB — LIPID PANEL
Cholesterol: 107 mg/dL (ref <200)
HDL: 36 mg/dL — ABNORMAL LOW (ref >=50)
LDL Cholesterol (Calc): 50 mg/dL (calc)
Non-HDL Cholesterol (Calc): 71 mg/dL (calc) (ref <130)
Total CHOL/HDL Ratio: 3 (calc) (ref <5.0)
Triglycerides: 128 mg/dL (ref <150)

## 2021-10-22 LAB — HEMOGLOBIN A1C
Hgb A1c MFr Bld: 5.7 % of total Hgb — ABNORMAL HIGH (ref <5.7)
Mean Plasma Glucose: 117 mg/dL
eAG (mmol/L): 6.5 mmol/L

## 2021-10-22 LAB — MICROALBUMIN / CREATININE URINE RATIO
Creatinine, Urine: 163 mg/dL (ref 20–275)
Microalb Creat Ratio: 104 mcg/mg creat — ABNORMAL HIGH (ref <30)
Microalb, Ur: 16.9 mg/dL

## 2021-10-22 LAB — CBC WITH DIFFERENTIAL/PLATELET
Absolute Monocytes: 788 cells/uL (ref 200–950)
Basophils Absolute: 56 cells/uL (ref 0–200)
Basophils Relative: 0.5 %
Eosinophils Absolute: 278 cells/uL (ref 15–500)
Eosinophils Relative: 2.5 %
HCT: 42 % (ref 35.0–45.0)
Hemoglobin: 13.5 g/dL (ref 11.7–15.5)
Lymphs Abs: 1920 cells/uL (ref 850–3900)
MCH: 26.8 pg — ABNORMAL LOW (ref 27.0–33.0)
MCHC: 32.1 g/dL (ref 32.0–36.0)
MCV: 83.5 fL (ref 80.0–100.0)
MPV: 12.2 fL (ref 7.5–12.5)
Monocytes Relative: 7.1 %
Neutro Abs: 8059 cells/uL — ABNORMAL HIGH (ref 1500–7800)
Neutrophils Relative %: 72.6 %
Platelets: 231 10*3/uL (ref 140–400)
RBC: 5.03 10*6/uL (ref 3.80–5.10)
RDW: 13.1 % (ref 11.0–15.0)
Total Lymphocyte: 17.3 %
WBC: 11.1 10*3/uL — ABNORMAL HIGH (ref 3.8–10.8)

## 2021-10-22 LAB — COMPLETE METABOLIC PANEL WITH GFR
AG Ratio: 1.4 (calc) (ref 1.0–2.5)
ALT: 7 U/L (ref 6–29)
AST: 9 U/L — ABNORMAL LOW (ref 10–35)
Albumin: 4 g/dL (ref 3.6–5.1)
Alkaline phosphatase (APISO): 107 U/L (ref 37–153)
BUN: 8 mg/dL (ref 7–25)
CO2: 25 mmol/L (ref 20–32)
Calcium: 10.4 mg/dL (ref 8.6–10.4)
Chloride: 106 mmol/L (ref 98–110)
Creat: 0.69 mg/dL (ref 0.50–1.05)
Globulin: 2.9 g/dL (calc) (ref 1.9–3.7)
Glucose, Bld: 85 mg/dL (ref 65–99)
Potassium: 4.5 mmol/L (ref 3.5–5.3)
Sodium: 139 mmol/L (ref 135–146)
Total Bilirubin: 0.5 mg/dL (ref 0.2–1.2)
Total Protein: 6.9 g/dL (ref 6.1–8.1)
eGFR: 96 mL/min/{1.73_m2} (ref >=60)

## 2021-10-22 LAB — TSH: TSH: 2.14 mIU/L (ref 0.40–4.50)

## 2021-10-22 LAB — PARATHYROID HORMONE, INTACT (NO CA): PTH: 89 pg/mL — ABNORMAL HIGH (ref 16–77)

## 2021-10-27 ENCOUNTER — Other Ambulatory Visit: Payer: Self-pay | Admitting: Family Medicine

## 2021-10-27 DIAGNOSIS — D72829 Elevated white blood cell count, unspecified: Secondary | ICD-10-CM

## 2021-10-28 DIAGNOSIS — D72829 Elevated white blood cell count, unspecified: Secondary | ICD-10-CM | POA: Diagnosis not present

## 2021-10-28 LAB — CBC WITH DIFFERENTIAL/PLATELET
Absolute Monocytes: 843 cells/uL (ref 200–950)
Basophils Absolute: 74 cells/uL (ref 0–200)
Basophils Relative: 0.6 %
Eosinophils Absolute: 335 cells/uL (ref 15–500)
Eosinophils Relative: 2.7 %
HCT: 38.9 % (ref 35.0–45.0)
Hemoglobin: 12.6 g/dL (ref 11.7–15.5)
Lymphs Abs: 3720 cells/uL (ref 850–3900)
MCH: 26.9 pg — ABNORMAL LOW (ref 27.0–33.0)
MCHC: 32.4 g/dL (ref 32.0–36.0)
MCV: 83.1 fL (ref 80.0–100.0)
MPV: 11.9 fL (ref 7.5–12.5)
Monocytes Relative: 6.8 %
Neutro Abs: 7428 cells/uL (ref 1500–7800)
Neutrophils Relative %: 59.9 %
Platelets: 275 10*3/uL (ref 140–400)
RBC: 4.68 10*6/uL (ref 3.80–5.10)
RDW: 12.8 % (ref 11.0–15.0)
Total Lymphocyte: 30 %
WBC: 12.4 10*3/uL — ABNORMAL HIGH (ref 3.8–10.8)

## 2021-10-29 NOTE — Progress Notes (Signed)
Left message for patient to return call. [facility] Nurse may review with patient  ?

## 2021-10-30 ENCOUNTER — Telehealth: Payer: Self-pay | Admitting: *Deleted

## 2021-10-30 NOTE — Telephone Encounter (Signed)
Pt returned call for lab results. Results and provider comments given to pt. Pt states she can come in Tuesday to have a CBC drawn again. Answered questions, verbalized understanding. ? Steele Sizer, MD  ?10/29/2021 11:13 AM EDT   ?  ?White count is higher but absolute neutrophils is back to normal.  ?She may want to either come in for follow up this week or repeat CBC in one week   ? ?

## 2021-10-31 ENCOUNTER — Other Ambulatory Visit: Payer: Self-pay

## 2021-10-31 DIAGNOSIS — D72829 Elevated white blood cell count, unspecified: Secondary | ICD-10-CM

## 2021-11-04 DIAGNOSIS — D72829 Elevated white blood cell count, unspecified: Secondary | ICD-10-CM | POA: Diagnosis not present

## 2021-11-04 LAB — CBC WITH DIFFERENTIAL/PLATELET
Absolute Monocytes: 627 cells/uL (ref 200–950)
Basophils Absolute: 69 cells/uL (ref 0–200)
Basophils Relative: 0.7 %
Eosinophils Absolute: 245 cells/uL (ref 15–500)
Eosinophils Relative: 2.5 %
HCT: 40.4 % (ref 35.0–45.0)
Hemoglobin: 12.8 g/dL (ref 11.7–15.5)
Lymphs Abs: 2430 cells/uL (ref 850–3900)
MCH: 26.4 pg — ABNORMAL LOW (ref 27.0–33.0)
MCHC: 31.7 g/dL — ABNORMAL LOW (ref 32.0–36.0)
MCV: 83.5 fL (ref 80.0–100.0)
MPV: 11.3 fL (ref 7.5–12.5)
Monocytes Relative: 6.4 %
Neutro Abs: 6429 cells/uL (ref 1500–7800)
Neutrophils Relative %: 65.6 %
Platelets: 302 10*3/uL (ref 140–400)
RBC: 4.84 10*6/uL (ref 3.80–5.10)
RDW: 12.9 % (ref 11.0–15.0)
Total Lymphocyte: 24.8 %
WBC: 9.8 10*3/uL (ref 3.8–10.8)

## 2021-12-01 ENCOUNTER — Other Ambulatory Visit: Payer: Self-pay | Admitting: Family Medicine

## 2021-12-01 DIAGNOSIS — R600 Localized edema: Secondary | ICD-10-CM

## 2021-12-01 DIAGNOSIS — E039 Hypothyroidism, unspecified: Secondary | ICD-10-CM

## 2021-12-17 DIAGNOSIS — E210 Primary hyperparathyroidism: Secondary | ICD-10-CM | POA: Diagnosis not present

## 2021-12-17 DIAGNOSIS — E559 Vitamin D deficiency, unspecified: Secondary | ICD-10-CM | POA: Diagnosis not present

## 2021-12-17 DIAGNOSIS — E039 Hypothyroidism, unspecified: Secondary | ICD-10-CM | POA: Diagnosis not present

## 2022-01-30 ENCOUNTER — Encounter: Payer: Self-pay | Admitting: Cardiology

## 2022-01-30 ENCOUNTER — Ambulatory Visit: Payer: BC Managed Care – PPO | Admitting: Cardiology

## 2022-01-30 VITALS — BP 140/80 | HR 83 | Ht 67.0 in | Wt 253.0 lb

## 2022-01-30 DIAGNOSIS — I10 Essential (primary) hypertension: Secondary | ICD-10-CM | POA: Diagnosis not present

## 2022-01-30 DIAGNOSIS — I499 Cardiac arrhythmia, unspecified: Secondary | ICD-10-CM | POA: Diagnosis not present

## 2022-01-30 DIAGNOSIS — F17200 Nicotine dependence, unspecified, uncomplicated: Secondary | ICD-10-CM | POA: Diagnosis not present

## 2022-01-30 DIAGNOSIS — R609 Edema, unspecified: Secondary | ICD-10-CM

## 2022-02-06 ENCOUNTER — Telehealth: Payer: Self-pay | Admitting: Acute Care

## 2022-02-06 NOTE — Telephone Encounter (Signed)
Attempted to reach pt to schedule annual LDCT-LVMM

## 2022-02-17 NOTE — Telephone Encounter (Signed)
Attempted to call again to schedule annual LDCT. No answer but did leave VM

## 2022-02-17 NOTE — Telephone Encounter (Signed)
Returned call to patient from [facility] VM.  NO answer.  Unable to leave a message to schedule LDCT

## 2022-03-23 DIAGNOSIS — M8588 Other specified disorders of bone density and structure, other site: Secondary | ICD-10-CM | POA: Diagnosis not present

## 2022-04-20 NOTE — Progress Notes (Unsigned)
Name: Charlene Hardy   MRN: [id number]    DOB: July 25, 1956   Date:04/21/2022       Progress Note  Subjective  Chief Complaint  Follow Up  HPI  DMII: she started Ozempic 09/2018 ( previously on Trulicity and Metformin) . She has peripheral neuropathy , doing better on Gabapentin but still has some tingling and numbness on her feet.   She denies polyphagia, but she has polydipsia ( likely secondary to  hyperparathyroidism )  she also has nocturia, she has dyslipidemia and HTN. She takes ARB . A1C is at goal even though she stopped taking Ozempic about 3 months ago due to cost.    Morbid obesity: BMI is over  40 with co-morbidities such as DM, HTN and dyslipidemia. She is off Ozempic due to cost    Bilateral knee arthralgias: she states doing well today.  She is off Meloxicam because of kidney function and is taking Tylenol and Gabapentin. She states right knee has intermittent effusion but not redness    Chronic bronchitis: She is still smoking and not ready to quit  She has SOB with moderate activity she also has dry cough, very seldom has wheezing - usually when mowing the yard . She got the rx of Trelegy but cannot afford it at this time    HTN: she has been out of lotrel 5/40 due to cost, still taking lasix and metoprolol , bp is at goal, but elevated during last visit with cardiologist . We will adjust lotrel to 5/20 and use good rx so she can afford it    Dyslipidemia: low HDL, on Atorvastatin and otc fish oil , last LDL was 50 - at goal    Hypothyroidism: last TSH was at goal, she is on levothyroxine 137 mcg daily and none on Sundays No dysphagia, no change in bowel movements , skin is always dry - worse on legs but also on her arms Last TSH at goal    Hyperparathyroidism: seeing Endocrinologist - Dr. Honor Junes ,. She sates cramps improved. Had a bone density done 03/2022 and still has osteopenia but low FRAX score  She takes otc vitamin D . Last pth was 37  - slightly better   She is  struggling financially due to a gas leak and also work is slow and having a pay cut, she was not able to fill her Lotrel rx and has been out for the past week. She has not been able to afford Trelegy. 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   Diabetic sensorimotor neuropathy ([place]) 01/20/2015   Dyslipidemia 01/20/2015   Essential (primary) hypertension 01/20/2015   H/O iron deficiency anemia 01/20/2015   Hypertensive retinopathy 01/20/2015   Adult hypothyroidism 01/20/2015   Background retinopathy due to secondary diabetes ([place]) 01/20/2015   Tinea pedis 01/20/2015   Vitamin D deficiency 02/26/2010   Cigarette smoker 03/06/2008    Past Surgical History:  Procedure Laterality Date   ABDOMINAL HYSTERECTOMY  08/11/1999  still has ovaries   BREAST BIOPSY Right 09/05/2018   Affirm Bx- Ribbon clip- path pending   COLONOSCOPY  08/11/2015   COLONOSCOPY WITH PROPOFOL N/A 06/10/2016   Procedure: COLONOSCOPY WITH PROPOFOL;  Surgeon: Christene Lye, MD;  Location: [facility] ENDOSCOPY;  Service: Endoscopy;  Laterality: N/A;   COLONOSCOPY WITH PROPOFOL N/A 06/11/2021   Procedure: COLONOSCOPY WITH PROPOFOL;  Surgeon: Robert Bellow, MD;  Location: [facility] ENDOSCOPY;  Service: Endoscopy;  Laterality: N/A;    Family History  Problem Relation Age of Onset   Multiple sclerosis Mother    Diabetes Father    Breast cancer Sister 74    Other Sister        DDD - she had to have back surgery   Hypertension Maternal Grandmother    Thyroid disease Paternal Uncle    Breast cancer Paternal Aunt    Heart Problems Paternal Aunt     Social History   Tobacco Use   Smoking status: Every Day    Packs/day: 1.00    Years: 41.00    Total pack years: 41.00    Types: Cigarettes    Start date: 08/10/1976   Smokeless tobacco: Never   Tobacco comments:    cutting down, smoking half pack lately   Substance Use Topics   Alcohol use: No    Alcohol/week: 0.0 standard drinks of alcohol     Current Outpatient Medications:    acetaminophen (TYLENOL) 500 MG tablet, Take 1 tablet (500 mg total) by mouth 2 (two) times daily., Disp: 30 tablet, Rfl: 0   amLODipine-benazepril (LOTREL) 5-20 MG capsule, Take 1 capsule by mouth daily., Disp: 90 capsule, Rfl: 1   aspirin 81 MG tablet, Take 1 tablet by mouth daily., Disp: , Rfl:    Cholecalciferol (VITAMIN D) 2000 UNITS tablet, Take 1 tablet by mouth daily., Disp: , Rfl:    ferrous sulfate 325 (65 FE) MG tablet, Take 1 tablet by mouth daily., Disp: , Rfl:    Fluticasone-Umeclidin-Vilant (TRELEGY ELLIPTA) 100-62.5-25 MCG/ACT AEPB, Inhale 1 puff into the lungs daily., Disp: 60 each, Rfl: 5   furosemide (LASIX) 20 MG tablet, Take 1 tablet by mouth once daily, Disp: 90 tablet, Rfl: 1   Ipratropium-Albuterol (COMBIVENT) 20-100 MCG/ACT AERS respimat, Inhale 1 puff into the lungs every 6 (six) hours., Disp: , Rfl:    MULTIPLE VITAMINS-MINERALS ER PO, Take 1 tablet by mouth daily., Disp: , Rfl:    naftifine (NAFTIN) 1 % cream, APPLY  CREAM TOPICALLY ONCE DAILY, Disp: 90 g, Rfl: 0   omega-3 fish oil (MAXEPA) 1000 MG CAPS capsule, Take 1 capsule by mouth daily., Disp: , Rfl:    Polyethylene Glycol 3350 POWD, Take 1 Dose by mouth as needed., Disp: , Rfl:    triamcinolone cream (KENALOG) 0.1 %, Apply 1 application topically 2 (two) times daily. Mix with Eucerin or Nivea lotion ( at home  ), Disp: 453.6 g,  Rfl: 0   atorvastatin (LIPITOR) 40 MG tablet, Take 1 tablet (40 mg total) by mouth daily., Disp: 90 tablet, Rfl: 1   gabapentin (NEURONTIN) 300 MG capsule, Take 1 capsule (300 mg total) by mouth 3 (three) times daily., Disp: 270 capsule, Rfl: 1   levothyroxine (SYNTHROID) 137 MCG tablet, TAKE 1 TABLET BY MOUTH ONCE DAILY BEFORE BREAKFAST 6 DAYS PER WEEK, DO NOT TAKE ANY ON SUNDAY, Disp: 72 tablet, Rfl: 1   metoprolol succinate (TOPROL-XL) 100 MG 24 hr tablet, Take 1 tablet (100 mg total) by mouth daily. Take with or  immediately following a meal., Disp: 90 tablet, Rfl: 1   potassium chloride SA (KLOR-CON M) 20 MEQ tablet, Take 1 tablet (20 mEq total) by mouth daily., Disp: 90 tablet, Rfl: 1  Allergies  Allergen Reactions   Hydrochlorothiazide     hyperparathyroidism   Pneumococcal Vaccine Rash   Pneumovax [Pneumococcal Polysaccharide Vaccine]     I personally reviewed active problem list, medication list, allergies, family history, social history, health maintenance with the patient/caregiver today.   ROS  Ten systems reviewed and is negative except as mentioned in HPI   Objective  Vitals:   04/21/22 0859  BP: 136/84  Pulse: 71  Resp: 16  SpO2: 97%  Weight: 259 lb (117.5 kg)  Height: '5\' 7"'$  (1.702 m)    Body mass index is 40.57 kg/m.  Physical Exam  Constitutional: Patient appears well-developed and well-nourished. Obese  No distress.  HEENT: head atraumatic, normocephalic, pupils equal and reactive to light, neck supple, throat within normal limits Cardiovascular: Normal rate, regular rhythm and normal heart sounds.  No murmur heard. No BLE edema. Pulmonary/Chest: Effort normal and breath sounds normal. No respiratory distress. Abdominal: Soft.  There is no tenderness. Psychiatric: Patient has a normal mood and affect. behavior is normal. Judgment and thought content normal.   Recent Results (from the past 2160 hour(s))  POCT HgB A1C     Status: None   Collection Time:  04/21/22  9:08 AM  Result Value Ref Range   Hemoglobin A1C 5.6 4.0 - 5.6 %   HbA1c POC (<> result, manual entry)     HbA1c, POC (prediabetic range)     HbA1c, POC (controlled diabetic range)      Diabetic Foot Exam: Diabetic Foot Exam - Simple   Simple Foot Form Visual Inspection See comments: Yes Sensation Testing Intact to touch and monofilament testing bilaterally: Yes Pulse Check Posterior Tibialis and Dorsalis pulse intact bilaterally: Yes Comments She has some thickness of skin, no longer maceration between toes       PHQ2/9:    04/21/2022    9:07 AM 10/21/2021    8:47 AM 04/18/2021    8:46 AM 10/16/2020    8:33 AM 06/25/2020    9:58 AM  Depression screen PHQ 2/9  Decreased Interest 0 0 0 0 0  Down, Depressed, Hopeless 0 0 0 0 0  PHQ - 2 Score 0 0 0 0 0  Altered sleeping 0 0  0 0  Tired, decreased energy 0 0  0 0  Change in appetite 0 0  0 0  Feeling bad or failure about yourself  0 0  0 0  Trouble concentrating 0 0  0 0  Moving slowly or fidgety/restless 0 0  0 0  Suicidal thoughts 0 0  0 0  PHQ-9 Score 0 0  0 0    phq 9 is negative   Fall Risk:    04/21/2022    9:07 AM 10/21/2021    8:47 AM 04/18/2021    8:46 AM 10/16/2020    8:33 AM 06/25/2020    9:58 AM  Fall Risk   Falls in the past year? 0 0 0 0 0  Number falls in past yr: 0 0 0 0 0  Injury with Fall? 0 0 0 0 0  Risk for fall due to : No Fall Risks No Fall Risks No Fall Risks    Follow up Falls prevention discussed Falls prevention discussed Falls prevention discussed  Functional Status Survey: Is the patient deaf or have difficulty hearing?: No Does the patient have difficulty seeing, even when wearing glasses/contacts?: No Does the patient have difficulty concentrating, remembering, or making decisions?: No Does the patient have difficulty walking or climbing stairs?: No Does the patient have difficulty dressing or bathing?: No Does the patient have difficulty doing errands alone such as  visiting a doctor's office or shopping?: No    Assessment & Plan  1. Type 2 diabetes, controlled, with neuropathy (HCC)  - POCT HgB A1C - HM Diabetes Foot Exam - gabapentin (NEURONTIN) 300 MG capsule; Take 1 capsule (300 mg total) by mouth 3 (three) times daily.  Dispense: 270 capsule; Refill: 1  2. Need for immunization against influenza  - Flu Vaccine QUAD High Dose(Fluad)  3. Dyslipidemia associated with type 2 diabetes mellitus (HCC)  - atorvastatin (LIPITOR) 40 MG tablet; Take 1 tablet (40 mg total) by mouth daily.  Dispense: 90 tablet; Refill: 1  4. Essential (primary) hypertension  - metoprolol succinate (TOPROL-XL) 100 MG 24 hr tablet; Take 1 tablet (100 mg total) by mouth daily. Take with or immediately following a meal.  Dispense: 90 tablet; Refill: 1 - amLODipine-benazepril (LOTREL) 5-20 MG capsule; Take 1 capsule by mouth daily.  Dispense: 90 capsule; Refill: 1  5. Bilateral edema of lower extremity  - potassium chloride SA (KLOR-CON M) 20 MEQ tablet; Take 1 tablet (20 mEq total) by mouth daily.  Dispense: 90 tablet; Refill: 1  6. Hyperparathyroidism ([place])  Under the care of Dr. Honor Junes   7. Simple chronic bronchitis (HCC)  - AMB Referral to Pharmacy Medication Management  8. Morbid obesity ([place])  Discussed with the patient the risk posed by an increased BMI. Discussed importance of portion control, calorie counting and at least 150 minutes of physical activity weekly. Avoid sweet beverages and drink more water. Eat at least 6 servings of fruit and vegetables daily    9. Acquired hypothyroidism  - levothyroxine (SYNTHROID) 137 MCG tablet; TAKE 1 TABLET BY MOUTH ONCE DAILY BEFORE BREAKFAST 6 DAYS PER WEEK, DO NOT TAKE ANY ON SUNDAY  Dispense: 72 tablet; Refill: 1

## 2022-04-21 ENCOUNTER — Encounter: Payer: Self-pay | Admitting: Family Medicine

## 2022-04-21 ENCOUNTER — Ambulatory Visit: Payer: BC Managed Care – PPO | Admitting: Family Medicine

## 2022-04-21 ENCOUNTER — Telehealth: Payer: Self-pay | Admitting: Pharmacist

## 2022-04-21 VITALS — BP 136/84 | HR 71 | Resp 16 | Ht 67.0 in | Wt 259.0 lb

## 2022-04-21 DIAGNOSIS — R600 Localized edema: Secondary | ICD-10-CM

## 2022-04-21 DIAGNOSIS — E039 Hypothyroidism, unspecified: Secondary | ICD-10-CM

## 2022-04-21 DIAGNOSIS — Z23 Encounter for immunization: Secondary | ICD-10-CM | POA: Diagnosis not present

## 2022-04-21 DIAGNOSIS — E1169 Type 2 diabetes mellitus with other specified complication: Secondary | ICD-10-CM

## 2022-04-21 DIAGNOSIS — E1140 Type 2 diabetes mellitus with diabetic neuropathy, unspecified: Secondary | ICD-10-CM | POA: Diagnosis not present

## 2022-04-21 DIAGNOSIS — E213 Hyperparathyroidism, unspecified: Secondary | ICD-10-CM

## 2022-04-21 DIAGNOSIS — I10 Essential (primary) hypertension: Secondary | ICD-10-CM | POA: Diagnosis not present

## 2022-04-21 DIAGNOSIS — J410 Simple chronic bronchitis: Secondary | ICD-10-CM

## 2022-04-21 DIAGNOSIS — E785 Hyperlipidemia, unspecified: Secondary | ICD-10-CM

## 2022-04-21 LAB — POCT GLYCOSYLATED HEMOGLOBIN (HGB A1C): Hemoglobin A1C: 5.6 % (ref 4.0–5.6)

## 2022-04-21 MED ORDER — LEVOTHYROXINE SODIUM 137 MCG PO TABS: ORAL_TABLET | ORAL | 1 refills | Status: DC

## 2022-04-21 MED ORDER — METOPROLOL SUCCINATE ER 100 MG PO TB24: 100.0000 mg | ORAL_TABLET | Freq: Every day | ORAL | 1 refills | Status: DC

## 2022-04-21 MED ORDER — AMLODIPINE BESY-BENAZEPRIL HCL 5-20 MG PO CAPS: 1.0000 | ORAL_CAPSULE | Freq: Every day | ORAL | 1 refills | Status: DC

## 2022-04-21 MED ORDER — GABAPENTIN 300 MG PO CAPS: 300.0000 mg | ORAL_CAPSULE | Freq: Three times a day (TID) | ORAL | 1 refills | Status: DC

## 2022-04-21 MED ORDER — ATORVASTATIN CALCIUM 40 MG PO TABS: 40.0000 mg | ORAL_TABLET | Freq: Every day | ORAL | 1 refills | Status: DC

## 2022-04-21 MED ORDER — POTASSIUM CHLORIDE CRYS ER 20 MEQ PO TBCR: 20.0000 meq | EXTENDED_RELEASE_TABLET | Freq: Every day | ORAL | 1 refills | Status: DC

## 2022-04-21 NOTE — Progress Notes (Signed)
[place] [facility])  [place] Team    04/21/2022  Charlene Hardy 1955-11-28 [id number]  Reason for referral: Medication Assistance with Trelegy   Referral source: Dr. Ancil Boozer Current insurance: [facility] Commercial   PMHx includes but not limited to:  Simple chronic bronchitis   Outreach:  Unsuccessful telephone call with Charlene Hardy.  HIPAA compliant voice mail left for patient to return my call.   Subjective:  Trelegy is unaffordable for patient. Patient reported to PCP that she has been out of her lotrel as well secondary to cost.    Objective: The ASCVD Risk score (Arnett DK, et al., 2019) failed to calculate for the following reasons:   The valid total cholesterol range is 130 to 320 mg/dL  Lab Results  Component Value Date   CREATININE 0.69 10/21/2021   CREATININE 0.7 09/03/2020   CREATININE 0.81 03/22/2020    Lab Results  Component Value Date   HGBA1C 5.6 04/21/2022    Lipid Panel     Component Value Date/Time   CHOL 107 10/21/2021 0957   CHOL 150 04/25/2015 0857   TRIG 128 10/21/2021 0957   HDL 36 (L) 10/21/2021 0957   HDL 41 04/25/2015 0857   CHOLHDL 3.0 10/21/2021 0957   VLDL 26 11/02/2016 1013   LDLCALC 50 10/21/2021 0957    BP Readings from Last 3 Encounters:  04/21/22 136/84  01/30/22 140/80  10/21/21 134/86    Allergies  Allergen Reactions   Hydrochlorothiazide     hyperparathyroidism   Pneumococcal Vaccine Rash   Pneumovax [Pneumococcal Polysaccharide Vaccine]     Plan: Will follow-up in 1 to 3 business days.   [name], PharmD [place] Pharmacist Office: [phone]

## 2022-04-23 NOTE — Progress Notes (Addendum)
[place] [facility])                                            [place] Team    04/23/2022  Allee Busk [birth year] [id number]  Placed follow up telephone call to [name]. Left HIPAA compliant voicemail requesting patient to return my call.   [name], PharmD [place] Pharmacist Office: [phone]

## 2022-04-27 ENCOUNTER — Ambulatory Visit (INDEPENDENT_AMBULATORY_CARE_PROVIDER_SITE_OTHER): Payer: BC Managed Care – PPO | Admitting: Vascular Surgery

## 2022-04-27 NOTE — Progress Notes (Signed)
[place] [facility])                                            [place] Team    04/27/2022  Charlene Hardy 1955-11-14 [id number]  Placed third and final outreach to [name]. Left HIPAA compliant voice mail for patient to return my call.    Reason for referral: Medication assistance with Trelegy   [facility] pharmacy referral is being closed due to the following reasons:  -Patient was outreached on 04/21/2022, 04/23/2022, and 04/27/2022.  -Will close [place] case as I have been unable to engage or maintain contact with patient -I have provided my contact information if patient or family needs to reach out to me in the future.  -Thank you for allowing [facility] pharmacy to be involved in this patient's care.   [place] is happy to assist the patient/family in the future for clinical pharmacy needs, following a discussion from your team about [place] outreach. Thank you for allowing [facility] to be a part of your patient's care.   [name], PharmD [place] Pharmacist Office: [phone]

## 2022-04-28 ENCOUNTER — Telehealth: Payer: Self-pay | Admitting: Pharmacist

## 2022-04-28 ENCOUNTER — Ambulatory Visit (INDEPENDENT_AMBULATORY_CARE_PROVIDER_SITE_OTHER): Payer: BC Managed Care – PPO | Admitting: Nurse Practitioner

## 2022-04-28 ENCOUNTER — Encounter (INDEPENDENT_AMBULATORY_CARE_PROVIDER_SITE_OTHER): Payer: Self-pay | Admitting: Nurse Practitioner

## 2022-04-28 VITALS — BP 170/80 | HR 50 | Resp 16 | Wt 252.0 lb

## 2022-04-28 DIAGNOSIS — M25572 Pain in left ankle and joints of left foot: Secondary | ICD-10-CM | POA: Diagnosis not present

## 2022-04-28 DIAGNOSIS — I10 Essential (primary) hypertension: Secondary | ICD-10-CM | POA: Diagnosis not present

## 2022-04-28 DIAGNOSIS — G8929 Other chronic pain: Secondary | ICD-10-CM

## 2022-04-28 DIAGNOSIS — I890 Lymphedema, not elsewhere classified: Secondary | ICD-10-CM

## 2022-04-28 NOTE — Progress Notes (Signed)
[place] [facility])  [place] Team    04/28/2022  Charlene Hardy 1956/04/04 [id number]  Reason for referral: Medication Assistance with Trelegy   Referral source: Dr. Ancil Boozer Current insurance: [facility] Commercial   PMHx includes but not limited to:  Simple chronic bronchitis   Outreach:  Successful telephone call with Charlene Hardy.  HIPAA identifiers verified.   Subjective:  Patient unable to afford Trelegy.   Objective: The ASCVD Risk score (Arnett DK, et al., 2019) failed to calculate for the following reasons:   The valid total cholesterol range is 130 to 320 mg/dL  Lab Results  Component Value Date   CREATININE 0.69 10/21/2021   CREATININE 0.7 09/03/2020   CREATININE 0.81 03/22/2020    Lab Results  Component Value Date   HGBA1C 5.6 04/21/2022    Lipid Panel     Component Value Date/Time   CHOL 107 10/21/2021 0957   CHOL 150 04/25/2015 0857   TRIG 128 10/21/2021 0957   HDL 36 (L) 10/21/2021 0957   HDL 41 04/25/2015 0857   CHOLHDL 3.0 10/21/2021 0957   VLDL 26 11/02/2016 1013   LDLCALC 50 10/21/2021 0957    BP Readings from Last 3 Encounters:  04/28/22 (!) 170/80  04/21/22 136/84  01/30/22 140/80    Allergies  Allergen Reactions   Hydrochlorothiazide     hyperparathyroidism   Pneumococcal Vaccine Rash   Pneumovax [Pneumococcal Polysaccharide Vaccine]    Assessment: Spoke with patient vie phone and verbal permission was granted from Charlene Hardy to complete the Trelegy co-pay assistance card. Information from the Trelegy co-pay card is as follows:  BIN#: [id number]  Charlene Hardy  GRP#: [id number]  Charlene Hardy  Offer Expires: 08/09/2022   The [place] team will place a call to Charlene Hardy on patient's behalf to coordinate the saving card and update the patient on the cost of Trelegy after the card has been applied. Patient confirmed that she was able to use a coupon for her Lotrel and has picked  that up.    Plan: Will route patient to [place] [facility] team to coordinate with pharmacy.  Will follow with PCP as needed.   [name], PharmD [place] Pharmacist Office: [phone]

## 2022-04-28 NOTE — Progress Notes (Signed)
Subjective:    Patient ID: Charlene Hardy, female    DOB: [birth year], 66 y.o.   MRN: [id number] Chief Complaint  Patient presents with   Follow-up    32yrfollow up    The patient returns to the office for followup evaluation regarding leg swelling.  The swelling has improved quite a bit and the pain associated with swelling has decreased substantially. There have not been any interval development of a ulcerations or wounds.  Since the previous visit the patient has been wearing graduated compression stockings and has noted some improvement in the lymphedema. The patient has been using compression routinely morning until night.  The patient also states elevation during the day and exercise (such as walking) is being done too.   The patient's largest complaint is pain in her knees as well as pain in her left ankle area.    Review of Systems  Cardiovascular:  Positive for leg swelling.  Musculoskeletal:  Positive for arthralgias and joint swelling.  All other systems reviewed and are negative.      Objective:   Physical Exam Vitals reviewed.  HENT:     Head: Normocephalic.  Cardiovascular:     Rate and Rhythm: Normal rate.  Pulmonary:     Effort: Pulmonary effort is normal.  Skin:    General: Skin is warm and dry.  Neurological:     Mental Status: She is oriented to person, place, and time.  Psychiatric:        Mood and Affect: Mood normal.        Behavior: Behavior normal.        Thought Content: Thought content normal.        Judgment: Judgment normal.     BP (!) 170/80 (BP Location: Right Arm)   Pulse (!) 50   Resp 16   Wt 252 lb (114.3 kg)   BMI 39.47 kg/m   Past Medical History:  Diagnosis Date   Arthritis    Diabetes mellitus without complication (HCC)    Heart murmur    Hyperlipidemia    Hypertension    Thyroid disease     Social History   Socioeconomic History   Marital status: Single    Spouse name: Not on file   Number of children: 0    Years of education: Not on file   Highest education level: High school graduate  Occupational History   Not on file  Tobacco Use   Smoking status: Every Day    Packs/day: 1.00    Years: 41.00    Total pack years: 41.00    Types: Cigarettes    Start date: 08/10/1976   Smokeless tobacco: Never   Tobacco comments:    cutting down, smoking half pack lately   Vaping Use   Vaping Use: Never used  Substance and Sexual Activity   Alcohol use: No    Alcohol/week: 0.0 standard drinks of alcohol   Drug use: No   Sexual activity: Not Currently  Other Topics Concern   Not on file  Social History Narrative   Not on file   Social Determinants of Health   Financial Resource Strain: Low Risk  (10/21/2021)   Overall Financial Resource Strain (CARDIA)    Difficulty of Paying Living Expenses: Not hard at all  Food Insecurity: No Food Insecurity (10/21/2021)   Hunger Vital Sign    Worried About Running Out of Food in the Last Year: Never true    Ran Out of Food in the  Last Year: Never true  Transportation Needs: No Transportation Needs (10/21/2021)   PRAPARE - Hydrologist (Medical): No    Lack of Transportation (Non-Medical): No  Physical Activity: Insufficiently Active (10/21/2021)   Exercise Vital Sign    Days of Exercise per Week: 3 days    Minutes of Exercise per Session: 30 min  Stress: No Stress Concern Present (10/21/2021)   [place]    Feeling of Stress : Not at all  Social Connections: Moderately Isolated (10/21/2021)   Social Connection and Isolation Panel [NHANES]    Frequency of Communication with Friends and Family: More than three times a week    Frequency of Social Gatherings with Friends and Family: Once a week    Attends Religious Services: More than 4 times per year    Active Member of [employer] or Organizations: No    Attends Archivist Meetings: Never    Marital Status:  Never married  Intimate Partner Violence: Not At Risk (10/21/2021)   Humiliation, Afraid, Rape, and Kick questionnaire    Fear of Current or Ex-Partner: No    Emotionally Abused: No    Physically Abused: No    Sexually Abused: No    Past Surgical History:  Procedure Laterality Date   ABDOMINAL HYSTERECTOMY  08/11/1999   still has ovaries   BREAST BIOPSY Right 09/05/2018   Affirm Bx- Ribbon clip- path pending   COLONOSCOPY  08/11/2015   COLONOSCOPY WITH PROPOFOL N/A 06/10/2016   Procedure: COLONOSCOPY WITH PROPOFOL;  Surgeon: Christene Lye, MD;  Location: [facility] ENDOSCOPY;  Service: Endoscopy;  Laterality: N/A;   COLONOSCOPY WITH PROPOFOL N/A 06/11/2021   Procedure: COLONOSCOPY WITH PROPOFOL;  Surgeon: Robert Bellow, MD;  Location: [facility] ENDOSCOPY;  Service: Endoscopy;  Laterality: N/A;    Family History  Problem Relation Age of Onset   Multiple sclerosis Mother    Diabetes Father    Breast cancer Sister 60   Other Sister        DDD - she had to have back surgery   Hypertension Maternal Grandmother    Thyroid disease Paternal Uncle    Breast cancer Paternal Aunt    Heart Problems Paternal Aunt     Allergies  Allergen Reactions   Hydrochlorothiazide     hyperparathyroidism   Pneumococcal Vaccine Rash   Pneumovax [Pneumococcal Polysaccharide Vaccine]        Latest Ref Rng & Units 11/04/2021   10:36 AM 10/28/2021    3:27 PM 10/21/2021    9:57 AM  CBC  WBC 3.8 - 10.8 Thousand/uL 9.8  12.4  11.1   Hemoglobin 11.7 - 15.5 g/dL 12.8  12.6  13.5   Hematocrit 35.0 - 45.0 % 40.4  38.9  42.0   Platelets 140 - 400 Thousand/uL 302  275  231       CMP     Component Value Date/Time   NA 139 10/21/2021 0957   NA 140 09/03/2020 0000   NA 133 (L) 08/18/2014 1941   K 4.5 10/21/2021 0957   K 3.9 08/18/2014 1941   CL 106 10/21/2021 0957   CL 100 08/18/2014 1941   CO2 25 10/21/2021 0957   CO2 28 08/18/2014 1941   GLUCOSE 85 10/21/2021 0957   GLUCOSE 109 (H)  08/18/2014 1941   BUN 8 10/21/2021 0957   BUN 10 04/25/2015 0857   BUN 9 08/18/2014 1941   CREATININE 0.69 10/21/2021 0957  CALCIUM 10.4 10/21/2021 0957   CALCIUM 9.7 08/18/2014 1941   PROT 6.9 10/21/2021 0957   PROT 6.8 04/25/2015 0857   PROT 7.2 08/18/2014 1941   ALBUMIN 3.8 11/02/2016 1013   ALBUMIN 4.0 04/25/2015 0857   ALBUMIN 3.5 08/18/2014 1941   AST 9 (L) 10/21/2021 0957   AST 11 (L) 08/18/2014 1941   ALT 7 10/21/2021 0957   ALT 18 08/18/2014 1941   ALKPHOS 96 11/02/2016 1013   ALKPHOS 105 08/18/2014 1941   BILITOT 0.5 10/21/2021 0957   BILITOT 0.3 04/25/2015 0857   BILITOT 0.4 08/18/2014 1941   GFRNONAA 77 03/22/2020 1159   GFRAA 89 03/22/2020 1159     No results found.     Assessment & Plan:   1. Lymphedema Patient has minimal edema today.  She is doing fairly well with control of her lower extremity edema.  Patient is advised to continue with conservative therapy including use of medical grade compression, elevation and activity.  Patient will follow-up on an annual basis.  2. Essential (primary) hypertension Continue antihypertensive medications as already ordered, these medications have been reviewed and there are no changes at this time.   3. Chronic pain of left ankle Continue NSAID medications as already ordered, these medications have been reviewed and there are no changes at this time.  Continued activity and therapy was stressed.    Current Outpatient Medications on File Prior to Visit  Medication Sig Dispense Refill   acetaminophen (TYLENOL) 500 MG tablet Take 1 tablet (500 mg total) by mouth 2 (two) times daily. 30 tablet 0   amLODipine-benazepril (LOTREL) 5-20 MG capsule Take 1 capsule by mouth daily. 90 capsule 1   aspirin 81 MG tablet Take 1 tablet by mouth daily.     atorvastatin (LIPITOR) 40 MG tablet Take 1 tablet (40 mg total) by mouth daily. 90 tablet 1   Cholecalciferol (VITAMIN D) 2000 UNITS tablet Take 1 tablet by mouth daily.      ferrous sulfate 325 (65 FE) MG tablet Take 1 tablet by mouth daily.     Fluticasone-Umeclidin-Vilant (TRELEGY ELLIPTA) 100-62.5-25 MCG/ACT AEPB Inhale 1 puff into the lungs daily. 60 each 5   furosemide (LASIX) 20 MG tablet Take 1 tablet by mouth once daily 90 tablet 1   gabapentin (NEURONTIN) 300 MG capsule Take 1 capsule (300 mg total) by mouth 3 (three) times daily. 270 capsule 1   Ipratropium-Albuterol (COMBIVENT) 20-100 MCG/ACT AERS respimat Inhale 1 puff into the lungs every 6 (six) hours.     levothyroxine (SYNTHROID) 137 MCG tablet TAKE 1 TABLET BY MOUTH ONCE DAILY BEFORE BREAKFAST 6 DAYS PER WEEK, DO NOT TAKE ANY ON SUNDAY 72 tablet 1   metoprolol succinate (TOPROL-XL) 100 MG 24 hr tablet Take 1 tablet (100 mg total) by mouth daily. Take with or immediately following a meal. 90 tablet 1   MULTIPLE VITAMINS-MINERALS ER PO Take 1 tablet by mouth daily.     naftifine (NAFTIN) 1 % cream APPLY  CREAM TOPICALLY ONCE DAILY 90 g 0   omega-3 fish oil (MAXEPA) 1000 MG CAPS capsule Take 1 capsule by mouth daily.     Polyethylene Glycol 3350 POWD Take 1 Dose by mouth as needed.     potassium chloride SA (KLOR-CON M) 20 MEQ tablet Take 1 tablet (20 mEq total) by mouth daily. 90 tablet 1   triamcinolone cream (KENALOG) 0.1 % Apply 1 application topically 2 (two) times daily. Mix with Eucerin or Nivea lotion ( at home  )  453.6 g 0   No current facility-administered medications on file prior to visit.    There are no Patient Instructions on file for this visit. No follow-ups on file.   Kris Hartmann, NP

## 2022-04-29 NOTE — Progress Notes (Signed)
[facility])                                            [place] Team    04/29/2022  Charlene Hardy [birth year] [id number]  Placed telephone outreach to [place] [facility] and provided Trelegy co-pay savings card. Cost for 1 inhaler per [facility] after the card applied is $559.68.   Placed call to [employer] to discuss possible alternatives. Per [name] with [employer], Trelegy should be covered at a cost of $35, however [name] could not verify if patient has to satisfy a deductible before the co-insurance picks up. Will follow up with [facility] tomorrow to ensure that they are dual billing [employer] and the co-pay card. Call reference # is [id number]  [name], PharmD [place] Pharmacist Office: [phone]

## 2022-05-01 NOTE — Progress Notes (Signed)
[facility])                                            [place] Team    05/01/2022  Mayeli Bornhorst 1956-03-09 [id number]   Placed follow up call to [place], the prescription was reprocessed under patient's insurance for a cost of $35. When the co-pay card was attempted, the co-pay card was declined.   Called patient and left voice mail for patient.   [name], PharmD [place] Pharmacist Office: [phone]

## 2022-09-03 LAB — CBC AND DIFFERENTIAL
HCT: 41 (ref 36–46)
Hemoglobin: 13.4 (ref 12.0–16.0)
Neutrophils Absolute: 5274
Platelets: 229 10*3/uL (ref 150–400)
WBC: 9

## 2022-09-03 LAB — BASIC METABOLIC PANEL
BUN: 10 (ref 4–21)
CO2: 31 — AB (ref 13–22)
Chloride: 103 (ref 99–108)
Creatinine: 0.8 (ref 0.5–1.1)
Glucose: 80
Potassium: 4.7 mEq/L (ref 3.5–5.1)
Sodium: 139 (ref 137–147)

## 2022-09-03 LAB — LIPID PANEL
Cholesterol: 143 (ref 0–200)
HDL: 43 (ref 35–70)
LDL Cholesterol: 78
Triglycerides: 122 (ref 40–160)

## 2022-09-03 LAB — COMPREHENSIVE METABOLIC PANEL
Albumin: 4 (ref 3.5–5.0)
Calcium: 10.8 — AB (ref 8.7–10.7)
Globulin: 2.8
eGFR: 79

## 2022-09-03 LAB — HEMOGLOBIN A1C: Hemoglobin A1C: 6

## 2022-09-03 LAB — HEPATIC FUNCTION PANEL
ALT: 7 U/L (ref 7–35)
AST: 10 — AB (ref 13–35)
Alkaline Phosphatase: 112 (ref 25–125)
Bilirubin, Total: 0.5

## 2022-09-03 LAB — CBC: RBC: 5 (ref 3.87–5.11)

## 2022-09-03 LAB — TSH: TSH: 22.62 — AB (ref 0.41–5.90)

## 2022-09-28 ENCOUNTER — Other Ambulatory Visit: Payer: Self-pay | Admitting: Family Medicine

## 2022-09-28 DIAGNOSIS — Z1231 Encounter for screening mammogram for malignant neoplasm of breast: Secondary | ICD-10-CM

## 2022-10-14 ENCOUNTER — Ambulatory Visit
Admission: RE | Admit: 2022-10-14 | Discharge: 2022-10-14 | Disposition: A | Discharge status: Home / Self Care | Payer: BC Managed Care – PPO | Source: Ambulatory Visit | Attending: Family Medicine | Admitting: Family Medicine

## 2022-10-14 DIAGNOSIS — Z1231 Encounter for screening mammogram for malignant neoplasm of breast: Secondary | ICD-10-CM

## 2022-10-20 ENCOUNTER — Ambulatory Visit: Payer: BC Managed Care – PPO | Admitting: Family Medicine

## 2022-10-20 NOTE — Progress Notes (Unsigned)
Name: Charlene Hardy   MRN: [id number]    DOB: 1956/07/16   Date:10/21/2022       Progress Note  Subjective  Chief Complaint  Follow Up  HPI  DMII: She has peripheral neuropathy , doing better on Gabapentin but still has some tingling and numbness on her feet.   She denies polyphagia, but she has polydipsia ( likely secondary to  hyperparathyroidism )  she also has nocturia, she has dyslipidemia and HTN. She takes ARB . She has been off Ozempic since Summer of 2023 due to cost but A1C has been controlled Eye exam done in 2023 and we will check urine micro    Morbid obesity: BMI is over 35  with co-morbidities such as DM, HTN and dyslipidemia. She is off Ozempic due to cost , but weight is stable    Bilateral knee arthralgias: she states doing well today.  She is off Meloxicam because of kidney function and is taking Tylenol and Gabapentin. Pain at work is around 6/10 but no pain at rest    Chronic bronchitis: She is still smoking and not ready to quit  She denies SOB but has a daily cough that sounds wet, very seldom has wheezing - usually when mowing the yard . She is using Trelegy and she thinks it helps with symptoms    HTN: Taking lotrel , metoprolol and furosemide intermittently, no chest pain or palpitation, she has intermittent lower extremity edema but no orthopnea    Dyslipidemia: low HDL, on Atorvastatin and otc fish oil , last LDL was done at work and up to 78 but TSH is not controlled , we will continue current dose    Hypothyroidism: last TSH was at goal, she is on levothyroxine 137 mcg daily and none on Sundays No dysphagia, no change in bowel movements , skin is always dry - worse on legs but also on her arms Last TSH was up in January when checked at work. We will recheck it today and adjust the dose if needed    Hyperparathyroidism: seeing Endocrinologist - Dr. Honor Junes ,. She sates cramps improved. Had a bone density done 03/2022 and still has osteopenia but low FRAX score   She takes otc vitamin D . Last pth was 71  - slightly better , we will recheck it today    Patient Active Problem List   Diagnosis Date Noted   Lymphedema 07/24/2020   Chronic venous insufficiency 07/24/2020   Morbid obesity ([place]) 09/13/2018   Mild concentric left ventricular hypertrophy (LVH) 05/31/2018   Carotid atherosclerosis, left 05/31/2018   Primary hyperparathyroidism ([place]) 02/28/2016   Arthralgia of both knees 10/23/2015   Chronic bronchitis ([place]) 10/23/2015   Osteopenia 10/23/2015   Left ankle pain 04/25/2015   Constipation 04/25/2015   Allergic rhinitis 01/20/2015   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/2015   Diabetic sensorimotor neuropathy ([place]) 01/20/2015   Dyslipidemia 01/20/2015   Essential (primary) hypertension 01/20/2015   H/O iron deficiency anemia 01/20/2015   Hypertensive retinopathy 01/20/2015   Adult hypothyroidism 01/20/2015   Background retinopathy due to secondary diabetes ([place]) 01/20/2015   Tinea pedis 01/20/2015   Vitamin D deficiency 02/26/2010   Cigarette smoker 03/06/2008    Past Surgical History:  Procedure Laterality Date   ABDOMINAL HYSTERECTOMY  08/11/1999   still has ovaries   BREAST BIOPSY Right 09/05/2018   Affirm Bx- Ribbon clip- path pending   COLONOSCOPY  08/11/2015   COLONOSCOPY WITH PROPOFOL N/A 06/10/2016   Procedure: COLONOSCOPY WITH PROPOFOL;  Surgeon: Daryel Gerald  Robinette Haines, MD;  Location: [facility] ENDOSCOPY;  Service: Endoscopy;  Laterality: N/A;   COLONOSCOPY WITH PROPOFOL N/A 06/11/2021   Procedure: COLONOSCOPY WITH PROPOFOL;  Surgeon: Robert Bellow, MD;  Location: [facility] ENDOSCOPY;  Service: Endoscopy;  Laterality: N/A;    Family History  Problem Relation Age of Onset   Multiple sclerosis Mother    Diabetes Father    Breast cancer Sister 67   Other Sister        DDD - she had to have back surgery   Hypertension Maternal Grandmother    Thyroid disease Paternal Uncle    Breast cancer Paternal Aunt    Heart Problems Paternal  Aunt     Social History   Tobacco Use   Smoking status: Every Day    Packs/day: 1.00    Years: 41.00    Total pack years: 41.00    Types: Cigarettes    Start date: 08/10/1976   Smokeless tobacco: Never   Tobacco comments:    cutting down, smoking half pack lately   Substance Use Topics   Alcohol use: No    Alcohol/week: 0.0 standard drinks of alcohol     Current Outpatient Medications:    acetaminophen (TYLENOL) 500 MG tablet, Take 1 tablet (500 mg total) by mouth 2 (two) times daily., Disp: 30 tablet, Rfl: 0   amLODipine-benazepril (LOTREL) 5-20 MG capsule, Take 1 capsule by mouth daily., Disp: 90 capsule, Rfl: 1   aspirin 81 MG tablet, Take 1 tablet by mouth daily., Disp: , Rfl:    atorvastatin (LIPITOR) 40 MG tablet, Take 1 tablet (40 mg total) by mouth daily., Disp: 90 tablet, Rfl: 1   Cholecalciferol (VITAMIN D) 2000 UNITS tablet, Take 1 tablet by mouth daily., Disp: , Rfl:    ferrous sulfate 325 (65 FE) MG tablet, Take 1 tablet by mouth daily., Disp: , Rfl:    Fluticasone-Umeclidin-Vilant (TRELEGY ELLIPTA) 100-62.5-25 MCG/ACT AEPB, Inhale 1 puff into the lungs daily., Disp: 60 each, Rfl: 5   furosemide (LASIX) 20 MG tablet, Take 1 tablet by mouth once daily, Disp: 90 tablet, Rfl: 1   gabapentin (NEURONTIN) 300 MG capsule, Take 1 capsule (300 mg total) by mouth 3 (three) times daily., Disp: 270 capsule, Rfl: 1   Ipratropium-Albuterol (COMBIVENT) 20-100 MCG/ACT AERS respimat, Inhale 1 puff into the lungs every 6 (six) hours., Disp: , Rfl:    levothyroxine (SYNTHROID) 137 MCG tablet, TAKE 1 TABLET BY MOUTH ONCE DAILY BEFORE BREAKFAST 6 DAYS PER WEEK, DO NOT TAKE ANY ON SUNDAY, Disp: 72 tablet, Rfl: 1   metoprolol succinate (TOPROL-XL) 100 MG 24 hr tablet, Take 1 tablet (100 mg total) by mouth daily. Take with or immediately following a meal., Disp: 90 tablet, Rfl: 1   MULTIPLE VITAMINS-MINERALS ER PO, Take 1 tablet by mouth daily., Disp: , Rfl:    naftifine (NAFTIN) 1 % cream,  APPLY  CREAM TOPICALLY ONCE DAILY, Disp: 90 g, Rfl: 0   omega-3 fish oil (MAXEPA) 1000 MG CAPS capsule, Take 1 capsule by mouth daily., Disp: , Rfl:    Polyethylene Glycol 3350 POWD, Take 1 Dose by mouth as needed., Disp: , Rfl:    potassium chloride SA (KLOR-CON M) 20 MEQ tablet, Take 1 tablet (20 mEq total) by mouth daily., Disp: 90 tablet, Rfl: 1   triamcinolone cream (KENALOG) 0.1 %, Apply 1 application topically 2 (two) times daily. Mix with Eucerin or Nivea lotion ( at home  ), Disp: 453.6 g, Rfl: 0  Allergies  Allergen Reactions  Hydrochlorothiazide     hyperparathyroidism   Pneumococcal Vaccine Rash   Pneumovax [Pneumococcal Polysaccharide Vaccine]     I personally reviewed active problem list, medication list, allergies, family history, social history, health maintenance with the patient/caregiver today.   ROS  Constitutional: Negative for fever or weight change.  Respiratory: positive for cough but no shortness of breath.   Cardiovascular: Negative for chest pain or palpitations.  Gastrointestinal: Negative for abdominal pain, no bowel changes.  Musculoskeletal: Negative for gait problem or joint swelling.  Skin: Negative for rash.  Neurological: Negative for dizziness or headache.  No other specific complaints in a complete review of systems (except as listed in HPI above).   Objective  Vitals:   10/21/22 0802  BP: 134/76  Pulse: 68  Resp: 16  SpO2: 97%  Weight: 255 lb (115.7 kg)  Height: '5\' 7"'$  (1.702 m)    Body mass index is 39.94 kg/m.  Physical Exam  Constitutional: Patient appears well-developed and well-nourished. Obese  No distress.  HEENT: head atraumatic, normocephalic, pupils equal and reactive to light, neck supple Cardiovascular: Normal rate, regular rhythm and normal heart sounds.  No murmur heard. No BLE edema. Pulmonary/Chest: Effort normal and breath sounds normal. No respiratory distress. Abdominal: Soft.  There is no  tenderness. Psychiatric: Patient has a normal mood and affect. behavior is normal. Judgment and thought content normal.   Recent Results (from the past 2160 hour(s))  POCT HgB A1C     Status: None   Collection Time: 10/21/22  8:03 AM  Result Value Ref Range   Hemoglobin A1C 5.4 4.0 - 5.6 %   HbA1c POC (<> result, manual entry)     HbA1c, POC (prediabetic range)     HbA1c, POC (controlled diabetic range)       PHQ2/9:    10/21/2022    8:01 AM 04/21/2022    9:07 AM 10/21/2021    8:47 AM 04/18/2021    8:46 AM 10/16/2020    8:33 AM  Depression screen PHQ 2/9  Decreased Interest 0 0 0 0 0  Down, Depressed, Hopeless 0 0 0 0 0  PHQ - 2 Score 0 0 0 0 0  Altered sleeping 0 0 0  0  Tired, decreased energy 0 0 0  0  Change in appetite 0 0 0  0  Feeling bad or failure about yourself  0 0 0  0  Trouble concentrating 0 0 0  0  Moving slowly or fidgety/restless 0 0 0  0  Suicidal thoughts 0 0 0  0  PHQ-9 Score 0 0 0  0    phq 9 is negative   Fall Risk:    10/21/2022    8:01 AM 04/21/2022    9:07 AM 10/21/2021    8:47 AM 04/18/2021    8:46 AM 10/16/2020    8:33 AM  Fall Risk   Falls in the past year? 0 0 0 0 0  Number falls in past yr: 0 0 0 0 0  Injury with Fall? 0 0 0 0 0  Risk for fall due to : No Fall Risks No Fall Risks No Fall Risks No Fall Risks   Follow up Falls prevention discussed Falls prevention discussed Falls prevention discussed Falls prevention discussed       Functional Status Survey: Is the patient deaf or have difficulty hearing?: No Does the patient have difficulty seeing, even when wearing glasses/contacts?: No Does the patient have difficulty concentrating, remembering, or making decisions?:  No Does the patient have difficulty walking or climbing stairs?: No Does the patient have difficulty dressing or bathing?: No Does the patient have difficulty doing errands alone such as visiting a doctor's office or shopping?: No    Assessment & Plan  1. Type 2  diabetes, controlled, with neuropathy (HCC)  - POCT HgB A1C - Urine Microalbumin w/creat. ratio - gabapentin (NEURONTIN) 300 MG capsule; Take 1 capsule (300 mg total) by mouth 3 (three) times daily.  Dispense: 270 capsule; Refill: 1  2. Dyslipidemia associated with type 2 diabetes mellitus (HCC)  - atorvastatin (LIPITOR) 40 MG tablet; Take 1 tablet (40 mg total) by mouth daily.  Dispense: 90 tablet; Refill: 1  3. Hyperparathyroidism ([place])  We will check pth today and vitamin D levels, reminded her to see Dr. Honor Junes   4. Simple chronic bronchitis ([place])  - Ambulatory Referral Lung Cancer Screening [facility] - Fluticasone-Umeclidin-Vilant (TRELEGY ELLIPTA) 100-62.5-25 MCG/ACT AEPB; Inhale 1 puff into the lungs daily.  Dispense: 60 each; Refill: 5  5. Morbid obesity ([place])  She is eating lighter and weight is trending down  6. Atherosclerosis of aorta ([place])  On statin therapy   7. Acquired hypothyroidism  - TSH  8. Essential (primary) hypertension  - amLODipine-benazepril (LOTREL) 5-20 MG capsule; Take 1 capsule by mouth daily.  Dispense: 90 capsule; Refill: 1 - metoprolol succinate (TOPROL-XL) 100 MG 24 hr tablet; Take 1 tablet (100 mg total) by mouth daily. Take with or immediately following a meal.  Dispense: 90 tablet; Refill: 1  9. Tobacco use  - Ambulatory Referral Lung Cancer Screening [facility]  10. Bilateral edema of lower extremity  - furosemide (LASIX) 20 MG tablet; Take 1 tablet (20 mg total) by mouth daily as needed.  Dispense: 90 tablet; Refill: 1 - potassium chloride SA (KLOR-CON M) 20 MEQ tablet; Take 1 tablet (20 mEq total) by mouth daily.  Dispense: 90 tablet; Refill: 1  11. Tinea pedis of both feet  - naftifine (NAFTIN) 1 % cream; APPLY  CREAM TOPICALLY ONCE DAILY  Dispense: 90 g; Refill: 0

## 2022-10-21 ENCOUNTER — Encounter: Payer: Self-pay | Admitting: Family Medicine

## 2022-10-21 ENCOUNTER — Ambulatory Visit: Payer: BC Managed Care – PPO | Admitting: Family Medicine

## 2022-10-21 VITALS — BP 134/76 | HR 68 | Resp 16 | Ht 67.0 in | Wt 255.0 lb

## 2022-10-21 DIAGNOSIS — E213 Hyperparathyroidism, unspecified: Secondary | ICD-10-CM | POA: Diagnosis not present

## 2022-10-21 DIAGNOSIS — E039 Hypothyroidism, unspecified: Secondary | ICD-10-CM

## 2022-10-21 DIAGNOSIS — R600 Localized edema: Secondary | ICD-10-CM

## 2022-10-21 DIAGNOSIS — I10 Essential (primary) hypertension: Secondary | ICD-10-CM

## 2022-10-21 DIAGNOSIS — I700 Atherosclerosis of aorta: Secondary | ICD-10-CM

## 2022-10-21 DIAGNOSIS — Z720 Tobacco use: Secondary | ICD-10-CM

## 2022-10-21 DIAGNOSIS — E785 Hyperlipidemia, unspecified: Secondary | ICD-10-CM

## 2022-10-21 DIAGNOSIS — J410 Simple chronic bronchitis: Secondary | ICD-10-CM | POA: Diagnosis not present

## 2022-10-21 DIAGNOSIS — B353 Tinea pedis: Secondary | ICD-10-CM

## 2022-10-21 DIAGNOSIS — E1140 Type 2 diabetes mellitus with diabetic neuropathy, unspecified: Secondary | ICD-10-CM

## 2022-10-21 DIAGNOSIS — E1169 Type 2 diabetes mellitus with other specified complication: Secondary | ICD-10-CM

## 2022-10-21 LAB — POCT GLYCOSYLATED HEMOGLOBIN (HGB A1C): Hemoglobin A1C: 5.4 % (ref 4.0–5.6)

## 2022-10-21 MED ORDER — METOPROLOL SUCCINATE ER 100 MG PO TB24: 100.0000 mg | ORAL_TABLET | Freq: Every day | ORAL | 1 refills | Status: DC

## 2022-10-21 MED ORDER — FLUTICASONE-UMECLIDIN-VILANT 100-62.5-25 MCG/ACT IN AEPB: 1.0000 | INHALATION_SPRAY | Freq: Every day | RESPIRATORY_TRACT | 5 refills | Status: DC

## 2022-10-21 MED ORDER — ATORVASTATIN CALCIUM 40 MG PO TABS: 40.0000 mg | ORAL_TABLET | Freq: Every day | ORAL | 1 refills | Status: DC

## 2022-10-21 MED ORDER — FUROSEMIDE 20 MG PO TABS: 20.0000 mg | ORAL_TABLET | Freq: Every day | ORAL | 1 refills | Status: DC | PRN

## 2022-10-21 MED ORDER — NAFTIFINE HCL 1 % EX CREA: TOPICAL_CREAM | CUTANEOUS | 0 refills | Status: DC

## 2022-10-21 MED ORDER — AMLODIPINE BESY-BENAZEPRIL HCL 5-20 MG PO CAPS: 1.0000 | ORAL_CAPSULE | Freq: Every day | ORAL | 1 refills | Status: DC

## 2022-10-21 MED ORDER — POTASSIUM CHLORIDE CRYS ER 20 MEQ PO TBCR: 20.0000 meq | EXTENDED_RELEASE_TABLET | Freq: Every day | ORAL | 1 refills | Status: DC

## 2022-10-21 MED ORDER — GABAPENTIN 300 MG PO CAPS: 300.0000 mg | ORAL_CAPSULE | Freq: Three times a day (TID) | ORAL | 1 refills | Status: DC

## 2022-10-22 LAB — MICROALBUMIN / CREATININE URINE RATIO
Creatinine, Urine: 109 mg/dL (ref 20–275)
Microalb Creat Ratio: 2 mcg/mg creat (ref <30)
Microalb, Ur: 0.2 mg/dL

## 2022-10-22 LAB — PTH, INTACT AND CALCIUM
Calcium: 10.4 mg/dL (ref 8.6–10.4)
PTH: 80 pg/mL — ABNORMAL HIGH (ref 16–77)

## 2022-10-22 LAB — VITAMIN D 25 HYDROXY (VIT D DEFICIENCY, FRACTURES): Vit D, 25-Hydroxy: 68 ng/mL (ref 30–100)

## 2022-10-22 LAB — TSH: TSH: 12.48 mIU/L — ABNORMAL HIGH (ref 0.40–4.50)

## 2022-10-23 ENCOUNTER — Other Ambulatory Visit: Payer: Self-pay | Admitting: Family Medicine

## 2022-10-23 DIAGNOSIS — E039 Hypothyroidism, unspecified: Secondary | ICD-10-CM

## 2022-10-23 MED ORDER — LEVOTHYROXINE SODIUM 125 MCG PO TABS: 125.0000 ug | ORAL_TABLET | Freq: Every day | ORAL | 0 refills | Status: DC

## 2022-12-07 NOTE — Progress Notes (Deleted)
Patient: Charlene Hardy, Female    DOB: 1956/01/16, 67 y.o.   MRN: [id number]  Visit Date: 12/07/2022  Today's Provider: Ruel Favors, MD   Welcome to Medicare  Subjective:    HPI Charlene Hardy is a 67 y.o. female who presents today for her Subsequent Annual Wellness Visit.  Patient/Caregiver input:    Review of Systems *** Constitutional: Negative for fever or weight change.  Respiratory: Negative for cough and shortness of breath.   Cardiovascular: Negative for chest pain or palpitations.  Gastrointestinal: Negative for abdominal pain, no bowel changes.  Musculoskeletal: Negative for gait problem or joint swelling.  Skin: Negative for rash.  Neurological: Negative for dizziness or headache.  No other specific complaints in a complete review of systems (except as listed in HPI above).  Past Medical History:  Diagnosis Date   Arthritis    Diabetes mellitus without complication (HCC)    Heart murmur    Hyperlipidemia    Hypertension    Thyroid disease     Past Surgical History:  Procedure Laterality Date   ABDOMINAL HYSTERECTOMY  08/11/1999   still has ovaries   BREAST BIOPSY Right 09/05/2018   Affirm Bx- Ribbon clip- path pending   COLONOSCOPY  08/11/2015   COLONOSCOPY WITH PROPOFOL N/A 06/10/2016   Procedure: COLONOSCOPY WITH PROPOFOL;  Surgeon: Kieth Brightly, MD;  Location: [facility];  Service: Endoscopy;  Laterality: N/A;   COLONOSCOPY WITH PROPOFOL N/A 06/11/2021   Procedure: COLONOSCOPY WITH PROPOFOL;  Surgeon: Earline Mayotte, MD;  Location: [facility]OSCOPY;  Service: Endoscopy;  Laterality: N/A;    Family History  Problem Relation Age of Onset   Multiple sclerosis Mother    Diabetes Father    Breast cancer Sister 55   Other Sister        DDD - she had to have back surgery   Hypertension Maternal Grandmother    Thyroid disease Paternal Uncle    Breast cancer Paternal Aunt    Heart Problems Paternal Aunt     Social History    Socioeconomic History   Marital status: Single    Spouse name: Not on file   Number of children: 0   Years of education: Not on file   Highest education level: High school graduate  Occupational History   Not on file  Tobacco Use   Smoking status: Every Day    Packs/day: 1.00    Years: 41.00    Additional pack years: 0.00    Total pack years: 41.00    Types: Cigarettes    Start date: 08/10/1976   Smokeless tobacco: Never   Tobacco comments:    cutting down, smoking half pack lately   Vaping Use   Vaping Use: Never used  Substance and Sexual Activity   Alcohol use: No    Alcohol/week: 0.0 standard drinks of alcohol   Drug use: No   Sexual activity: Not Currently  Other Topics Concern   Not on file  Social History Narrative   Not on file   Social Determinants of Health   Financial Resource Strain: Low Risk  (10/21/2021)   Overall Financial Resource Strain (CARDIA)    Difficulty of Paying Living Expenses: Not hard at all  Food Insecurity: No Food Insecurity (10/21/2021)   Hunger Vital Sign    Worried About Running Out of Food in the Last Year: Never true    Ran Out of Food in the Last Year: Never true  Transportation Needs: No Transportation Needs (10/21/2021)  PRAPARE - Administrator, Civil Service (Medical): No    Lack of Transportation (Non-Medical): No  Physical Activity: Insufficiently Active (10/21/2021)   Exercise Vital Sign    Days of Exercise per Week: 3 days    Minutes of Exercise per Session: 30 min  Stress: No Stress Concern Present (10/21/2021)   [employer] of Occupational Health - Occupational Stress Questionnaire    Feeling of Stress : Not at all  Social Connections: Moderately Isolated (10/21/2021)   Social Connection and Isolation Panel [NHANES]    Frequency of Communication with Friends and Family: More than three times a week    Frequency of Social Gatherings with Friends and Family: Once a week    Attends Religious Services: More  than 4 times per year    Active Member of [employer] or Organizations: No    Attends Banker Meetings: Never    Marital Status: Never married  Intimate Partner Violence: Not At Risk (10/21/2021)   Humiliation, Afraid, Rape, and Kick questionnaire    Fear of Current or Ex-Partner: No    Emotionally Abused: No    Physically Abused: No    Sexually Abused: No    Outpatient Encounter Medications as of 12/08/2022  Medication Sig   acetaminophen (TYLENOL) 500 MG tablet Take 1 tablet (500 mg total) by mouth 2 (two) times daily.   amLODipine-benazepril (LOTREL) 5-20 MG capsule Take 1 capsule by mouth daily.   aspirin 81 MG tablet Take 1 tablet by mouth daily.   atorvastatin (LIPITOR) 40 MG tablet Take 1 tablet (40 mg total) by mouth daily.   Cholecalciferol (VITAMIN D) 2000 UNITS tablet Take 1 tablet by mouth daily.   ferrous sulfate 325 (65 FE) MG tablet Take 1 tablet by mouth daily.   Fluticasone-Umeclidin-Vilant (TRELEGY ELLIPTA) 100-62.5-25 MCG/ACT AEPB Inhale 1 puff into the lungs daily.   furosemide (LASIX) 20 MG tablet Take 1 tablet (20 mg total) by mouth daily as needed.   gabapentin (NEURONTIN) 300 MG capsule Take 1 capsule (300 mg total) by mouth 3 (three) times daily.   Ipratropium-Albuterol (COMBIVENT) 20-100 MCG/ACT AERS respimat Inhale 1 puff into the lungs every 6 (six) hours.   levothyroxine (SYNTHROID) 125 MCG tablet Take 1 tablet (125 mcg total) by mouth daily. And recheck TSH in 6 weeks   metoprolol succinate (TOPROL-XL) 100 MG 24 hr tablet Take 1 tablet (100 mg total) by mouth daily. Take with or immediately following a meal.   MULTIPLE VITAMINS-MINERALS ER PO Take 1 tablet by mouth daily.   naftifine (NAFTIN) 1 % cream APPLY  CREAM TOPICALLY ONCE DAILY   omega-3 fish oil (MAXEPA) 1000 MG CAPS capsule Take 1 capsule by mouth daily.   Polyethylene Glycol 3350 POWD Take 1 Dose by mouth as needed.   potassium chloride SA (KLOR-CON M) 20 MEQ tablet Take 1 tablet (20 mEq  total) by mouth daily.   triamcinolone cream (KENALOG) 0.1 % Apply 1 application topically 2 (two) times daily. Mix with Eucerin or Nivea lotion ( at home  )   No facility-administered encounter medications on file as of 12/08/2022.    Allergies  Allergen Reactions   Hydrochlorothiazide     hyperparathyroidism   Pneumococcal Vaccine Rash   Pneumovax [Pneumococcal Polysaccharide Vaccine]     Care Team Updated in EHR: {Yes/No:30480221}  Last Vision Exam: *** Wears corrective lenses: {Yes/No:30480221} Last Dental Exam: *** Last Hearing Exam: *** Wears Hearing Aids: {Yes/No:30480221}  Functional Ability / Safety Screening 1.  Was  the timed Get Up and Go test shorter than 30 seconds?  {Blank single:19197::"yes","no"} 2.  Does the patient need help with the phone, transportation, shopping,      preparing meals, housework, laundry, medications, or managing money?  {Blank single:19197::"yes","no"} 3.  Is the patient's home free of loose throw rugs in walkways, pet beds, electrical cords, etc?   {Blank single:19197::"yes","no"}      Grab bars in the bathroom? {Blank single:19197::"yes","no"}      Handrails on the stairs?   {Blank single:19197::"yes","no"}      Adequate lighting?   {Blank single:19197::"yes","no"} 4.  Has the patient noticed any hearing difficulties?   {Blank single:19197::"yes","no"}  Diet Recall and Exercise Regimen: ***  Advanced Care Planning: A voluntary discussion about advance care planning including the explanation and discussion of advance directives.  Discussed health care proxy and Living will, and the patient was able to identify a health care proxy as ***.  Patient {DOES_DOES ZOX:09604} have a living will at present time. If patient does have living will, I have requested they bring this to the clinic to be scanned in to their chart. Does patient have a HCPOA?    {Blank single:19197::"yes","no"} If yes, name and contact information:  Does patient have a living  will or MOST form?  {Blank single:19197::"yes","no"}  Cancer Screenings: Skin: *** Lung: Low Dose CT Chest recommended if Age 44-80 years, 30 pack-year currently smoking OR have quit w/in 15years. Patient does not qualify. Breast: Up to date on Mammogram? Yes  Up to date of Bone Density/Dexa? Yes Colon: Colonoscopy 06/11/21  Additional Screenings: Hepatitis B/HIV/Syphillis: N/A Hepatitis C Screening: 04/05/12 Intimate Partner Violence: Negative  Objective:   Vitals: There were no vitals taken for this visit. There is no height or weight on file to calculate BMI.  No results found.  Physical Exam Constitutional: Patient appears well-developed and well-nourished. Obese *** No distress.  HEENT: head atraumatic, normocephalic, pupils equal and reactive to light, ears ***, neck supple, throat within normal limits Cardiovascular: Normal rate, regular rhythm and normal heart sounds.  No murmur heard. No BLE edema. Pulmonary/Chest: Effort normal and breath sounds normal. No respiratory distress. Abdominal: Soft.  There is no tenderness. Psychiatric: Patient has a normal mood and affect. behavior is normal. Judgment and thought content normal.  Cognitive Testing - 6-CIT  Correct? Score   What year is it? {YES NO:22349} {Numbers; 0-4:31231} Yes = 0    No = 4  What month is it? {YES NO:22349} {Numbers; 0-4:31231} Yes = 0    No = 3  Remember:     Floyde Parkins, [address], [place]     What time is it? {YES NO:22349} {Numbers; 0-4:31231} Yes = 0    No = 3  Count backwards from 20 to 1 {YES NO:22349} {Numbers; 0-4:31231} Correct = 0    1 error = 2   More than 1 error = 4  Say the months of the year in reverse. {YES NO:22349} {Numbers; 0-4:31231} Correct = 0    1 error = 2   More than 1 error = 4  What address did I ask you to remember? {YES NO:22349} {NUMBERS; 0-10:5044} Correct = 0  1 error = 2    2 error = 4    3 error = 6    4 error = 8    All wrong = 10       TOTAL SCORE  {Numbers;  5-40:98119}/14   Interpretation:  {Desc; normal/abnormal:11317::"Normal"}  Normal (  0-7) Abnormal (8-28)   Fall Risk:    10/21/2022    8:01 AM 04/21/2022    9:07 AM 10/21/2021    8:47 AM 04/18/2021    8:46 AM 10/16/2020    8:33 AM  Fall Risk   Falls in the past year? 0 0 0 0 0  Number falls in past yr: 0 0 0 0 0  Injury with Fall? 0 0 0 0 0  Risk for fall due to : No Fall Risks No Fall Risks No Fall Risks No Fall Risks   Follow up Falls prevention discussed Falls prevention discussed Falls prevention discussed Falls prevention discussed     Depression Screen    10/21/2022    8:01 AM 04/21/2022    9:07 AM 10/21/2021    8:47 AM 04/18/2021    8:46 AM 10/16/2020    8:33 AM  Depression screen PHQ 2/9  Decreased Interest 0 0 0 0 0  Down, Depressed, Hopeless 0 0 0 0 0  PHQ - 2 Score 0 0 0 0 0  Altered sleeping 0 0 0  0  Tired, decreased energy 0 0 0  0  Change in appetite 0 0 0  0  Feeling bad or failure about yourself  0 0 0  0  Trouble concentrating 0 0 0  0  Moving slowly or fidgety/restless 0 0 0  0  Suicidal thoughts 0 0 0  0  PHQ-9 Score 0 0 0  0    Recent Results (from the past 2160 hour(s))  POCT HgB A1C     Status: None   Collection Time: 10/21/22  8:03 AM  Result Value Ref Range   Hemoglobin A1C 5.4 4.0 - 5.6 %   HbA1c POC (<> result, manual entry)     HbA1c, POC (prediabetic range)     HbA1c, POC (controlled diabetic range)    Urine Microalbumin w/creat. ratio     Status: None   Collection Time: 10/21/22  8:46 AM  Result Value Ref Range   Creatinine, Urine 109 20 - 275 mg/dL   Microalb, Ur 0.2 mg/dL    Comment: Reference Range Not established    Microalb Creat Ratio 2 <30 mcg/mg creat    Comment: . The ADA defines abnormalities in albumin excretion as follows: Marland Kitchen Albuminuria Category        Result (mcg/mg creatinine) . Normal to Mildly increased   <30 Moderately increased         30-299  Severely increased           > OR = 300 . The ADA recommends that at least  two of three specimens collected within a 3-6 month period be abnormal before considering a patient to be within a diagnostic category.   TSH     Status: Abnormal   Collection Time: 10/21/22  8:46 AM  Result Value Ref Range   TSH 12.48 (H) 0.40 - 4.50 mIU/L  PTH, intact and calcium     Status: Abnormal   Collection Time: 10/21/22  8:46 AM  Result Value Ref Range   PTH 80 (H) 16 - 77 pg/mL    Comment: . Interpretive Guide    Intact PTH           Calcium ------------------    ----------           ------- Normal Parathyroid    Normal               Normal Hypoparathyroidism  Low or Low Normal    Low Hyperparathyroidism    Primary            Normal or High       High    Secondary          High                 Normal or Low    Tertiary           High                 High Non-Parathyroid    Hypercalcemia      Low or Low Normal    High .    Calcium 10.4 8.6 - 10.4 mg/dL  VITAMIN D 25 Hydroxy (Vit-D Deficiency, Fractures)     Status: None   Collection Time: 10/21/22  8:46 AM  Result Value Ref Range   Vit D, 25-Hydroxy 68 30 - 100 ng/mL    Comment: Vitamin D Status         25-OH Vitamin D: . Deficiency:                    <20 ng/mL Insufficiency:             20 - 29 ng/mL Optimal:                 > or = 30 ng/mL . For 25-OH Vitamin D testing on patients on  D2-supplementation and patients for whom quantitation  of D2 and D3 fractions is required, the [facility](TM) 25-OH VIT D, (D2,D3), LC/MS/MS is recommended: order  code 16109 (patients >73yrs). . See Note 1 . Note 1 . For additional information, please refer to  http://education.[facility].com/faq/FAQ199  (This link is being provided for informational/ educational purposes only.)     Assessment & Plan:    1. Welcome to Medicare preventive visit   *** type dotphrase "dot"diagmed to refresh this list  Exercise Activities and Dietary recommendations  Goals   None    - Discussed health benefits of  physical activity, and encouraged her to engage in regular exercise appropriate for her age and condition.   Immunization History  Administered Date(s) Administered   Fluad Quad(high Dose 65+) 04/18/2021, 04/21/2022   Influenza,inj,Quad PF,6+ Mos 04/25/2015, 05/25/2016, 05/28/2017, 05/31/2018, 04/25/2019   Influenza-Unspecified 03/16/2014, 06/05/2020   PFIZER(Purple Top)SARS-COV-2 Vaccination 11/30/2019, 12/22/2019   PNEUMOCOCCAL CONJUGATE-20 10/21/2021   Pneumococcal Conjugate-13 07/17/2014   Pneumococcal Polysaccharide-23 05/27/2010   Tdap 01/03/2008, 05/31/2018   Zoster Recombinat (Shingrix) 06/25/2020, 10/16/2020   Zoster, Live 05/27/2010    Health Maintenance  Topic Date Due   OPHTHALMOLOGY EXAM  07/16/2018   COVID-19 Vaccine (3 - Pfizer risk series) 01/19/2020   Lung Cancer Screening  12/18/2021   INFLUENZA VACCINE  03/11/2023   FOOT EXAM  04/22/2023   HEMOGLOBIN A1C  04/23/2023   Diabetic kidney evaluation - eGFR measurement  09/04/2023   Diabetic kidney evaluation - Urine ACR  10/21/2023   MAMMOGRAM  10/13/2024   COLONOSCOPY (Pts 45-71yrs Insurance coverage will need to be confirmed)  06/11/2026   DTaP/Tdap/Td (3 - Td or Tdap) 05/31/2028   Pneumonia Vaccine 28+ Years old  Completed   DEXA SCAN  Completed   Hepatitis C Screening  Completed   Zoster Vaccines- Shingrix  Completed   HPV VACCINES  Aged Out    No orders of the defined types were placed in this encounter.   Current Outpatient Medications:    acetaminophen (TYLENOL)  500 MG tablet, Take 1 tablet (500 mg total) by mouth 2 (two) times daily., Disp: 30 tablet, Rfl: 0   amLODipine-benazepril (LOTREL) 5-20 MG capsule, Take 1 capsule by mouth daily., Disp: 90 capsule, Rfl: 1   aspirin 81 MG tablet, Take 1 tablet by mouth daily., Disp: , Rfl:    atorvastatin (LIPITOR) 40 MG tablet, Take 1 tablet (40 mg total) by mouth daily., Disp: 90 tablet, Rfl: 1   Cholecalciferol (VITAMIN D) 2000 UNITS tablet, Take 1 tablet by  mouth daily., Disp: , Rfl:    ferrous sulfate 325 (65 FE) MG tablet, Take 1 tablet by mouth daily., Disp: , Rfl:    Fluticasone-Umeclidin-Vilant (TRELEGY ELLIPTA) 100-62.5-25 MCG/ACT AEPB, Inhale 1 puff into the lungs daily., Disp: 60 each, Rfl: 5   furosemide (LASIX) 20 MG tablet, Take 1 tablet (20 mg total) by mouth daily as needed., Disp: 90 tablet, Rfl: 1   gabapentin (NEURONTIN) 300 MG capsule, Take 1 capsule (300 mg total) by mouth 3 (three) times daily., Disp: 270 capsule, Rfl: 1   Ipratropium-Albuterol (COMBIVENT) 20-100 MCG/ACT AERS respimat, Inhale 1 puff into the lungs every 6 (six) hours., Disp: , Rfl:    levothyroxine (SYNTHROID) 125 MCG tablet, Take 1 tablet (125 mcg total) by mouth daily. And recheck TSH in 6 weeks, Disp: 90 tablet, Rfl: 0   metoprolol succinate (TOPROL-XL) 100 MG 24 hr tablet, Take 1 tablet (100 mg total) by mouth daily. Take with or immediately following a meal., Disp: 90 tablet, Rfl: 1   MULTIPLE VITAMINS-MINERALS ER PO, Take 1 tablet by mouth daily., Disp: , Rfl:    naftifine (NAFTIN) 1 % cream, APPLY  CREAM TOPICALLY ONCE DAILY, Disp: 90 g, Rfl: 0   omega-3 fish oil (MAXEPA) 1000 MG CAPS capsule, Take 1 capsule by mouth daily., Disp: , Rfl:    Polyethylene Glycol 3350 POWD, Take 1 Dose by mouth as needed., Disp: , Rfl:    potassium chloride SA (KLOR-CON M) 20 MEQ tablet, Take 1 tablet (20 mEq total) by mouth daily., Disp: 90 tablet, Rfl: 1   triamcinolone cream (KENALOG) 0.1 %, Apply 1 application topically 2 (two) times daily. Mix with Eucerin or Nivea lotion ( at home  ), Disp: 453.6 g, Rfl: 0 There are no discontinued medications.  I have personally reviewed and addressed the Medicare Annual Wellness health risk assessment questionnaire and have noted the following in the patient's chart:  A.         Medical and social history & family history B.         Use of alcohol, tobacco, and illicit drugs  C.         Current medications and supplements D.          Functional and Cognitive ability and status E.         Nutritional status F.         Physical activity G.        Advance directives H.         List of other physicians I.          Hospitalizations, surgeries, and ER visits in previous 12 months J.         Vitals K.         Screenings such as hearing, vision, cognitive function, and depression L.         Referrals and appointments: ***  In addition, I have reviewed and discussed with patient certain preventive protocols, quality metrics, and best  practice recommendations. A written personalized care plan for preventive services as well as general preventive health recommendations were provided to patient.   See attached scanned questionnaire for additional information.   No follow-ups on file. *** refresh to show

## 2022-12-08 ENCOUNTER — Ambulatory Visit: Payer: BC Managed Care – PPO | Admitting: Family Medicine

## 2022-12-08 DIAGNOSIS — Z0000 Encounter for general adult medical examination without abnormal findings: Secondary | ICD-10-CM

## 2023-03-02 ENCOUNTER — Encounter: Payer: Self-pay | Admitting: Emergency Medicine

## 2023-04-22 NOTE — Progress Notes (Signed)
Name: Charlene Hardy   MRN: [id number]    DOB: 1956/03/11   Date:04/23/2023       Progress Note  Subjective  Chief Complaint  Follow Up  HPI  DMII: She had a gap in her insurance, now has Medicare. She has peripheral neuropathy , doing better on Gabapentin but still has some tingling and numbness on her feet - right worse than left   She denies polyphagia, but she has polydipsia ( likely secondary to  hyperparathyroidism )  she also has nocturia, she has dyslipidemia and HTN. She takes ARB . She has been off Ozempic since Summer of 2023 due to cost . She is currently only on diet and A1C is 6.1 %   Morbid obesity: BMI is over 35  with co-morbidities such as DM, HTN and dyslipidemia. She is off Ozempic due to cost , her weight is up 20 lbs in the past 6 months. She retired in May and has not been as active. Discussed importance of trying to follow a diabetic diet and regular physical activity    Bilateral knee arthralgias: she states doing well today.  She is off Meloxicam because of decrease in kidney function and is taking Tylenol and Gabapentin.    Chronic bronchitis: She is still smoking and not ready to quit  She states she has been out of Breo for months and over the past month she has noticed increase in SOB , wheezing. She states symptoms started when she was exposed but strong perfume scent.    HTN: Taking lotrel our or Metoprolol  and furosemide , she denies  chest pain or palpitation, she has intermittent lower extremity edema but no orthopnea . Heart rate is in the 70 's range and BP is at goal, we will resume metoprolol but at a lower    Dyslipidemia: low HDL, on Atorvastatin and otc fish oil, se has been out of levothyroxine for a couple of weeks, she will resume that and return in about 5 weeks to have labs done    Hypothyroidism: she had gap in insurance and states medicare is not paying for levothyroxine, explained it is generic, use goodrx and pay cash if needed. Must take  medication, return for labs in about 5 weeks . She has gained a lot of weight since last visit    Hyperparathyroidism: seeing Endocrinologist - Dr. Gershon Crane ,. She sates cramps improved. Had a bone density done 03/2022 and still has osteopenia but low FRAX score  She takes otc vitamin D . Last pth was 79   Patient Active Problem List   Diagnosis Date Noted   Lymphedema 07/24/2020   Chronic venous insufficiency 07/24/2020   Morbid obesity (HCC) 09/13/2018   Mild concentric left ventricular hypertrophy (LVH) 05/31/2018   Carotid stenosis 05/31/2018   Primary hyperparathyroidism (HCC) 02/28/2016   Arthralgia of both knees 10/23/2015   Chronic bronchitis (HCC) 10/23/2015   Osteopenia 10/23/2015   Left ankle pain 04/25/2015   Constipation 04/25/2015   Allergic rhinitis 01/20/2015   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/2015   Diabetic sensorimotor neuropathy (HCC) 01/20/2015   Dyslipidemia 01/20/2015   Essential (primary) hypertension 01/20/2015   H/O iron deficiency anemia 01/20/2015   Hypertensive retinopathy 01/20/2015   Adult hypothyroidism 01/20/2015   Background retinopathy due to secondary diabetes (HCC) 01/20/2015   Tinea pedis 01/20/2015   Vitamin D deficiency 02/26/2010   Cigarette smoker 03/06/2008    Past Surgical History:  Procedure Laterality Date   ABDOMINAL HYSTERECTOMY  08/11/1999   still  has ovaries   BREAST BIOPSY Right 09/05/2018   Affirm Bx- Ribbon clip- path pending   COLONOSCOPY  08/11/2015   COLONOSCOPY WITH PROPOFOL N/A 06/10/2016   Procedure: COLONOSCOPY WITH PROPOFOL;  Surgeon: Kieth Brightly, MD;  Location: [facility];  Service: Endoscopy;  Laterality: N/A;   COLONOSCOPY WITH PROPOFOL N/A 06/11/2021   Procedure: COLONOSCOPY WITH PROPOFOL;  Surgeon: Earline Mayotte, MD;  Location: [facility] END[facility]COPY;  Service: Endoscopy;  Laterality: N/A;    Family History  Problem Relation Age of Onset   Multiple sclerosis Mother    Diabetes Father    Breast  cancer Sister 86   Other Sister        DDD - she had to have back surgery   Hypertension Maternal Grandmother    Thyroid disease Paternal Uncle    Breast cancer Paternal Aunt    Heart Problems Paternal Aunt     Social History   Tobacco Use   Smoking status: Every Day    Current packs/day: 1.00    Average packs/day: 1 pack/day for 46.7 years (46.7 ttl pk-yrs)    Types: Cigarettes    Start date: 08/10/1976   Smokeless tobacco: Never   Tobacco comments:    cutting down, smoking half pack lately   Substance Use Topics   Alcohol use: No    Alcohol/week: 0.0 standard drinks of alcohol     Current Outpatient Medications:    acetaminophen (TYLENOL) 500 MG tablet, Take 1 tablet (500 mg total) by mouth 2 (two) times daily., Disp: 30 tablet, Rfl: 0   albuterol (VENTOLIN HFA) 108 (90 Base) MCG/ACT inhaler, Inhale 2 puffs into the lungs every 6 (six) hours as needed for wheezing or shortness of breath., Disp: 8 g, Rfl: 0   aspirin 81 MG tablet, Take 1 tablet by mouth daily., Disp: , Rfl:    Budeson-Glycopyrrol-Formoterol (BREZTRI AEROSPHERE) 160-9-4.8 MCG/ACT AERO, Inhale 2 puffs into the lungs 2 (two) times daily., Disp: 32.1 g, Rfl: 1   Cholecalciferol (VITAMIN D) 2000 UNITS tablet, Take 1 tablet by mouth daily., Disp: , Rfl:    ferrous sulfate 325 (65 FE) MG tablet, Take 1 tablet by mouth daily., Disp: , Rfl:    MULTIPLE VITAMINS-MINERALS ER PO, Take 1 tablet by mouth daily., Disp: , Rfl:    naftifine (NAFTIN) 1 % cream, APPLY  CREAM TOPICALLY ONCE DAILY, Disp: 90 g, Rfl: 0   omega-3 fish oil (MAXEPA) 1000 MG CAPS capsule, Take 1 capsule by mouth daily., Disp: , Rfl:    Polyethylene Glycol 3350 POWD, Take 1 Dose by mouth as needed., Disp: , Rfl:    predniSONE (DELTASONE) 10 MG tablet, Take 1 tablet (10 mg total) by mouth 2 (two) times daily with a meal., Disp: 10 tablet, Rfl: 0   triamcinolone cream (KENALOG) 0.1 %, Apply 1 application topically 2 (two) times daily. Mix with Eucerin or  Nivea lotion ( at home  ), Disp: 453.6 g, Rfl: 0   amLODipine-benazepril (LOTREL) 5-20 MG capsule, Take 1 capsule by mouth daily., Disp: 90 capsule, Rfl: 1   atorvastatin (LIPITOR) 40 MG tablet, Take 1 tablet (40 mg total) by mouth daily., Disp: 90 tablet, Rfl: 1   furosemide (LASIX) 20 MG tablet, Take 1 tablet (20 mg total) by mouth daily as needed., Disp: 90 tablet, Rfl: 1   gabapentin (NEURONTIN) 300 MG capsule, Take 1 capsule (300 mg total) by mouth 3 (three) times daily., Disp: 270 capsule, Rfl: 1   levothyroxine (SYNTHROID) 125 MCG tablet,  Take 1 tablet (125 mcg total) by mouth in the morning., Disp: 90 tablet, Rfl: 0   metoprolol succinate (TOPROL-XL) 50 MG 24 hr tablet, Take 1 tablet (50 mg total) by mouth daily. Take with or immediately following a meal., Disp: 90 tablet, Rfl: 1   potassium chloride SA (KLOR-CON M) 20 MEQ tablet, Take 1 tablet (20 mEq total) by mouth daily., Disp: 90 tablet, Rfl: 1  Allergies  Allergen Reactions   Hydrochlorothiazide     hyperparathyroidism   Pneumococcal Vaccine Rash   Pneumovax [Pneumococcal Polysaccharide Vaccine]     I personally reviewed active problem list, medication list, allergies, family history, social history, health maintenance with the patient/caregiver today.   ROS  Ten systems reviewed and is negative except as mentioned in HPI    Objective  Vitals:   04/23/23 0951  BP: 134/80  Pulse: 75  Resp: 16  Temp: 98.3 F (36.8 C)  TempSrc: Oral  SpO2: 94%  Weight: 275 lb 9.6 oz (125 kg)  Height: 5\' 7"  (1.702 m)    Body mass index is 43.17 kg/m.  Physical Exam  Constitutional: Patient appears well-developed and well-nourished. Obese  No distress.  HEENT: head atraumatic, normocephalic, pupils equal and reactive to light, neck supple Cardiovascular: Normal rate, regular rhythm and normal heart sounds.  No murmur heard. No BLE edema. Pulmonary/Chest: Effort normal , bilateral rhonchi and scattered wheezing noticed No  respiratory distress. Abdominal: Soft.  There is no tenderness. Psychiatric: Patient has a normal mood and affect. behavior is normal. Judgment and thought content normal.   Recent Results (from the past 2160 hour(s))  POCT HgB A1C     Status: Abnormal   Collection Time: 04/23/23 10:03 AM  Result Value Ref Range   Hemoglobin A1C 6.1 (A) 4.0 - 5.6 %   HbA1c POC (<> result, manual entry)     HbA1c, POC (prediabetic range)     HbA1c, POC (controlled diabetic range)      Diabetic Foot Exam: Diabetic Foot Exam - Simple   Simple Foot Form Visual Inspection See comments: Yes Sensation Testing See comments: Yes Pulse Check Posterior Tibialis and Dorsalis pulse intact bilaterally: Yes Comments Thick toenails, also failed monofilament test on right foot       PHQ2/9:    04/23/2023    9:50 AM 10/21/2022    8:01 AM 04/21/2022    9:07 AM 10/21/2021    8:47 AM 04/18/2021    8:46 AM  Depression screen PHQ 2/9  Decreased Interest 0 0 0 0 0  Down, Depressed, Hopeless 0 0 0 0 0  PHQ - 2 Score 0 0 0 0 0  Altered sleeping 0 0 0 0   Tired, decreased energy 0 0 0 0   Change in appetite 0 0 0 0   Feeling bad or failure about yourself  0 0 0 0   Trouble concentrating 0 0 0 0   Moving slowly or fidgety/restless 0 0 0 0   Suicidal thoughts 0 0 0 0   PHQ-9 Score 0 0 0 0   Difficult doing work/chores Not difficult at all        phq 9 is negative   Fall Risk:    04/23/2023    9:50 AM 10/21/2022    8:01 AM 04/21/2022    9:07 AM 10/21/2021    8:47 AM 04/18/2021    8:46 AM  Fall Risk   Falls in the past year? 0 0 0 0 0  Number falls  in past yr: 0 0 0 0 0  Injury with Fall? 0 0 0 0 0  Risk for fall due to : No Fall Risks No Fall Risks No Fall Risks No Fall Risks No Fall Risks  Follow up Falls prevention discussed;Education provided;Falls evaluation completed Falls prevention discussed Falls prevention discussed Falls prevention discussed Falls prevention discussed      Functional Status  Survey: Is the patient deaf or have difficulty hearing?: No Does the patient have difficulty seeing, even when wearing glasses/contacts?: No Does the patient have difficulty concentrating, remembering, or making decisions?: No Does the patient have difficulty walking or climbing stairs?: No Does the patient have difficulty dressing or bathing?: No Does the patient have difficulty doing errands alone such as visiting a doctor's office or shopping?: No    Assessment & Plan  1. Type 2 diabetes, controlled, with neuropathy (HCC)  - POCT HgB A1C - HM Diabetes Foot Exam - gabapentin (NEURONTIN) 300 MG capsule; Take 1 capsule (300 mg total) by mouth 3 (three) times daily.  Dispense: 270 capsule; Refill: 1  2. Morbid obesity (HCC)  Discussed with the patient the risk posed by an increased BMI. Discussed importance of portion control, calorie counting and at least 150 minutes of physical activity weekly. Avoid sweet beverages and drink more water. Eat at least 6 servings of fruit and vegetables daily    3. Primary hyperparathyroidism (HCC)  Recheck labs next visit  4. Simple chronic bronchitis (HCC)  - Budeson-Glycopyrrol-Formoterol (BREZTRI AEROSPHERE) 160-9-4.8 MCG/ACT AERO; Inhale 2 puffs into the lungs 2 (two) times daily.  Dispense: 32.1 g; Refill: 1  5. Adult hypothyroidism  - levothyroxine (SYNTHROID) 125 MCG tablet; Take 1 tablet (125 mcg total) by mouth in the morning.  Dispense: 90 tablet; Refill: 0  6. Essential (primary) hypertension  - amLODipine-benazepril (LOTREL) 5-20 MG capsule; Take 1 capsule by mouth daily.  Dispense: 90 capsule; Refill: 1 - metoprolol succinate (TOPROL-XL) 50 MG 24 hr tablet; Take 1 tablet (50 mg total) by mouth daily. Take with or immediately following a meal.  Dispense: 90 tablet; Refill: 1  7. Vitamin D deficiency  Continue supplementation   8. H/O iron deficiency anemia  Recheck level  9. Dyslipidemia associated with type 2 diabetes  mellitus (HCC)  - atorvastatin (LIPITOR) 40 MG tablet; Take 1 tablet (40 mg total) by mouth daily.  Dispense: 90 tablet; Refill: 1  10. Bilateral edema of lower extremity  - furosemide (LASIX) 20 MG tablet; Take 1 tablet (20 mg total) by mouth daily as needed.  Dispense: 90 tablet; Refill: 1 - potassium chloride SA (KLOR-CON M) 20 MEQ tablet; Take 1 tablet (20 mEq total) by mouth daily.  Dispense: 90 tablet; Refill: 1

## 2023-04-22 NOTE — Progress Notes (Unsigned)
MRN : [id number]  Charlene Hardy is a 67 y.o. (1956/04/01) female who presents with chief complaint of legs swell.  History of Present Illness:   The patient returns to the office for followup evaluation regarding leg swelling.  The swelling has improved quite a bit and the pain associated with swelling has decreased substantially. There have not been any interval development of a ulcerations or wounds.  Since the previous visit the patient has been wearing graduated compression stockings and has noted some improvement in the lymphedema. The patient has been using compression routinely morning until night.  The patient also states elevation during the day and exercise (such as walking) is being done too.  Carotid duplex ultrasound dated 04/29/2018 showed RICA >50% (60-79% by standard criteria) and LICA 40-59%   No outpatient medications have been marked as taking for the 04/26/23 encounter (Appointment) with Gilda Crease, Latina Craver, MD.    Past Medical History:  Diagnosis Date   Arthritis    Diabetes mellitus without complication (HCC)    Heart murmur    Hyperlipidemia    Hypertension    Thyroid disease     Past Surgical History:  Procedure Laterality Date   ABDOMINAL HYSTERECTOMY  08/11/1999   still has ovaries   BREAST BIOPSY Right 09/05/2018   Affirm Bx- Ribbon clip- path pending   COLONOSCOPY  08/11/2015   COLONOSCOPY WITH PROPOFOL N/A 06/10/2016   Procedure: COLONOSCOPY WITH PROPOFOL;  Surgeon: Kieth Brightly, MD;  Location: [facility];  Service: Endoscopy;  Laterality: N/A;   COLONOSCOPY WITH PROPOFOL N/A 06/11/2021   Procedure: COLONOSCOPY WITH PROPOFOL;  Surgeon: Earline Mayotte, MD;  Location: [facility];  Service: Endoscopy;  Laterality: N/A;    Social History Social History   Tobacco Use   Smoking status: Every Day    Current packs/day: 1.00    Average packs/day: 1 pack/day for 46.7 years (46.7 ttl pk-yrs)    Types:  Cigarettes    Start date: 08/10/1976   Smokeless tobacco: Never   Tobacco comments:    cutting down, smoking half pack lately   Vaping Use   Vaping status: Never Used  Substance Use Topics   Alcohol use: No    Alcohol/week: 0.0 standard drinks of alcohol   Drug use: No    Family History Family History  Problem Relation Age of Onset   Multiple sclerosis Mother    Diabetes Father    Breast cancer Sister 74   Other Sister        DDD - she had to have back surgery   Hypertension Maternal Grandmother    Thyroid disease Paternal Uncle    Breast cancer Paternal Aunt    Heart Problems Paternal Aunt     Allergies  Allergen Reactions   Hydrochlorothiazide     hyperparathyroidism   Pneumococcal Vaccine Rash   Pneumovax [Pneumococcal Polysaccharide Vaccine]      REVIEW OF SYSTEMS (Negative unless checked)  Constitutional: [] Weight loss  [] Fever  [] Chills Cardiac: [] Chest pain   [] Chest pressure   [] Palpitations   [] Shortness of breath when laying flat   [] Shortness of breath with exertion. Vascular:  [] Pain in legs with walking   [x] Pain in legs with standing  [] History of DVT   [] Phlebitis   [x] Swelling in legs   [] Varicose veins   [] Non-healing ulcers Pulmonary:   []   Uses home oxygen   [] Productive cough   [] Hemoptysis   [] Wheeze  [] COPD   [] Asthma Neurologic:  [] Dizziness   [] Seizures   [] History of stroke   [] History of TIA  [] Aphasia   [] Vissual changes   [] Weakness or numbness in arm   [] Weakness or numbness in leg Musculoskeletal:   [] Joint swelling   [] Joint pain   [] Low back pain Hematologic:  [] Easy bruising  [] Easy bleeding   [] Hypercoagulable state   [] Anemic Gastrointestinal:  [] Diarrhea   [] Vomiting  [] Gastroesophageal reflux/heartburn   [] Difficulty swallowing. Genitourinary:  [] Chronic kidney disease   [] Difficult urination  [] Frequent urination   [] Blood in urine Skin:  [] Rashes   [] Ulcers  Psychological:  [] History of anxiety   []  History of major  depression.  Physical Examination  There were no vitals filed for this visit. There is no height or weight on file to calculate BMI. Gen: WD/WN, NAD Head: [place]/AT, No temporalis wasting.  Ear/Nose/Throat: Hearing grossly intact, nares w/o erythema or drainage, pinna without lesions Eyes: PER, EOMI, sclera nonicteric.  Neck: Supple, no gross masses.  No JVD.  Pulmonary:  Good air movement, no audible wheezing, no use of accessory muscles.  Cardiac: RRR, precordium not hyperdynamic. Vascular:  scattered varicosities present bilaterally.  Mild venous stasis changes to the legs bilaterally.  1-2+ soft pitting edema, CEAP [id number].  Carotid bruit noted  Vessel Right Left  Radial Palpable Palpable  Gastrointestinal: soft, non-distended. No guarding/no peritoneal signs.  Musculoskeletal: M/S 5/5 throughout.  No deformity.  Neurologic: CN 2-12 intact. Pain and light touch intact in extremities.  Symmetrical.  Speech is fluent. Motor exam as listed above. Psychiatric: Judgment intact, Mood & affect appropriate for pt's clinical situation. Dermatologic: Venous rashes no ulcers noted.  No changes consistent with cellulitis. Lymph : No lichenification or skin changes of chronic lymphedema.  CBC Lab Results  Component Value Date   WBC 9.0 09/03/2022   HGB 13.4 09/03/2022   HCT 41 09/03/2022   MCV 83.5 11/04/2021   PLT 229 09/03/2022    BMET    Component Value Date/Time   NA 139 09/03/2022 0000   NA 133 (L) 08/18/2014 1941   K 4.7 09/03/2022 0000   K 3.9 08/18/2014 1941   CL 103 09/03/2022 0000   CL 100 08/18/2014 1941   CO2 31 (A) 09/03/2022 0000   CO2 28 08/18/2014 1941   GLUCOSE 85 10/21/2021 0957   GLUCOSE 109 (H) 08/18/2014 1941   BUN 10 09/03/2022 0000   BUN 9 08/18/2014 1941   CREATININE 0.8 09/03/2022 0000   CREATININE 0.69 10/21/2021 0957   CALCIUM 10.4 10/21/2022 0846   CALCIUM 9.7 08/18/2014 1941   GFRNONAA 77 03/22/2020 1159   GFRAA 89 03/22/2020 1159   CrCl cannot  be calculated (Patient's most recent lab result is older than the maximum 21 days allowed.).  COAG No results found for: "INR", "PROTIME"  Radiology No results found.   Assessment/Plan 1. Lymphedema Recommend:  No surgery or intervention at this point in time.    I have reviewed my discussion with the patient regarding venous insufficiency and secondary lymph edema and why it  causes symptoms. I have discussed with the patient the chronic skin changes that accompany these problems and the long term sequela such as ulceration and infection.  Patient will continue wearing graduated compression on a daily basis a prescription, if needed, was given to the patient to keep this updated. The patient will  put the compression on first  thing in the morning and removing them in the evening. The patient is instructed specifically not to sleep in the compression.  In addition, behavioral modification including elevation during the day will be continued.  Diet and salt restriction will also be helpful.  Previous duplex ultrasound of the lower extremities shows normal deep venous system, superficial reflux was not present.   Following the review of the ultrasound the patient will follow up in 12 months to reassess the degree of swelling and the control that graduated compression is offering.   The patient can be assessed for a Lymph Pump at that time.  However, at this time the patient states they are satisfied with the control compression and elevation is yielding.    2. Chronic venous insufficiency Recommend:  No surgery or intervention at this point in time.    I have reviewed my discussion with the patient regarding venous insufficiency and secondary lymph edema and why it  causes symptoms. I have discussed with the patient the chronic skin changes that accompany these problems and the long term sequela such as ulceration and infection.  Patient will continue wearing graduated compression on a daily  basis a prescription, if needed, was given to the patient to keep this updated. The patient will  put the compression on first thing in the morning and removing them in the evening. The patient is instructed specifically not to sleep in the compression.  In addition, behavioral modification including elevation during the day will be continued.  Diet and salt restriction will also be helpful.  Previous duplex ultrasound of the lower extremities shows normal deep venous system, superficial reflux was not present.   Following the review of the ultrasound the patient will follow up in 12 months to reassess the degree of swelling and the control that graduated compression is offering.   The patient can be assessed for a Lymph Pump at that time.  However, at this time the patient states they are satisfied with the control compression and elevation is yielding.    3. Essential (primary) hypertension Continue antihypertensive medications as already ordered, these medications have been reviewed and there are no changes at this time.  4. Bilateral carotid artery stenosis Recommend:  Given the patient's asymptomatic subcritical stenosis no further invasive testing or surgery at this time.  Duplex ultrasound is overdue and will be ordered for the next 4 to 6 weeks.  Continue antiplatelet therapy as prescribed Continue management of CAD, HTN and Hyperlipidemia Healthy heart diet,  encouraged exercise at least 4 times per week Follow up in 4 to 6 weeks with duplex ultrasound and physical exam  - VAS US CAROTID; Future  5. Dyslipidemia Continue statin as ordered and reviewed, no changes at this time    Levora Dredge, MD  04/22/2023 12:19 PM

## 2023-04-23 ENCOUNTER — Ambulatory Visit (INDEPENDENT_AMBULATORY_CARE_PROVIDER_SITE_OTHER): Payer: Medicare (Managed Care) | Admitting: Family Medicine

## 2023-04-23 ENCOUNTER — Other Ambulatory Visit: Payer: Self-pay | Admitting: Family Medicine

## 2023-04-23 ENCOUNTER — Encounter: Payer: Self-pay | Admitting: Family Medicine

## 2023-04-23 ENCOUNTER — Telehealth: Payer: Self-pay | Admitting: Family Medicine

## 2023-04-23 VITALS — BP 134/80 | HR 75 | Temp 98.3°F | Resp 16 | Ht 67.0 in | Wt 275.6 lb

## 2023-04-23 DIAGNOSIS — E559 Vitamin D deficiency, unspecified: Secondary | ICD-10-CM | POA: Diagnosis not present

## 2023-04-23 DIAGNOSIS — I10 Essential (primary) hypertension: Secondary | ICD-10-CM

## 2023-04-23 DIAGNOSIS — J410 Simple chronic bronchitis: Secondary | ICD-10-CM | POA: Diagnosis not present

## 2023-04-23 DIAGNOSIS — E785 Hyperlipidemia, unspecified: Secondary | ICD-10-CM

## 2023-04-23 DIAGNOSIS — Z862 Personal history of diseases of the blood and blood-forming organs and certain disorders involving the immune mechanism: Secondary | ICD-10-CM

## 2023-04-23 DIAGNOSIS — E039 Hypothyroidism, unspecified: Secondary | ICD-10-CM | POA: Diagnosis not present

## 2023-04-23 DIAGNOSIS — E1169 Type 2 diabetes mellitus with other specified complication: Secondary | ICD-10-CM

## 2023-04-23 DIAGNOSIS — E1140 Type 2 diabetes mellitus with diabetic neuropathy, unspecified: Secondary | ICD-10-CM | POA: Diagnosis not present

## 2023-04-23 DIAGNOSIS — E210 Primary hyperparathyroidism: Secondary | ICD-10-CM

## 2023-04-23 DIAGNOSIS — R600 Localized edema: Secondary | ICD-10-CM | POA: Diagnosis not present

## 2023-04-23 LAB — POCT GLYCOSYLATED HEMOGLOBIN (HGB A1C): Hemoglobin A1C: 6.1 % — AB (ref 4.0–5.6)

## 2023-04-23 MED ORDER — LEVOTHYROXINE SODIUM 125 MCG PO TABS: 125.0000 ug | ORAL_TABLET | Freq: Every morning | ORAL | 0 refills | Status: DC

## 2023-04-23 MED ORDER — AMLODIPINE BESY-BENAZEPRIL HCL 5-20 MG PO CAPS: 1.0000 | ORAL_CAPSULE | Freq: Every day | ORAL | 1 refills | Status: DC

## 2023-04-23 MED ORDER — ATORVASTATIN CALCIUM 40 MG PO TABS: 40.0000 mg | ORAL_TABLET | Freq: Every day | ORAL | 1 refills | Status: DC

## 2023-04-23 MED ORDER — FUROSEMIDE 20 MG PO TABS: 20.0000 mg | ORAL_TABLET | Freq: Every day | ORAL | 1 refills | Status: DC | PRN

## 2023-04-23 MED ORDER — POTASSIUM CHLORIDE CRYS ER 20 MEQ PO TBCR: 20.0000 meq | EXTENDED_RELEASE_TABLET | Freq: Every day | ORAL | 1 refills | Status: DC

## 2023-04-23 MED ORDER — METOPROLOL SUCCINATE ER 50 MG PO TB24: 50.0000 mg | ORAL_TABLET | Freq: Every day | ORAL | 1 refills | Status: DC

## 2023-04-23 MED ORDER — PREDNISONE 10 MG PO TABS: 10.0000 mg | ORAL_TABLET | Freq: Two times a day (BID) | ORAL | 0 refills | Status: DC

## 2023-04-23 MED ORDER — ALBUTEROL SULFATE HFA 108 (90 BASE) MCG/ACT IN AERS: 2.0000 | INHALATION_SPRAY | Freq: Four times a day (QID) | RESPIRATORY_TRACT | 0 refills | Status: DC | PRN

## 2023-04-23 MED ORDER — BREZTRI AEROSPHERE 160-9-4.8 MCG/ACT IN AERO: 2.0000 | INHALATION_SPRAY | Freq: Two times a day (BID) | RESPIRATORY_TRACT | 1 refills | Status: DC

## 2023-04-23 MED ORDER — GABAPENTIN 300 MG PO CAPS: 300.0000 mg | ORAL_CAPSULE | Freq: Three times a day (TID) | ORAL | 1 refills | Status: DC

## 2023-04-23 NOTE — Telephone Encounter (Signed)
Copied from CRM [id number]. Topic: General - Other >> Apr 23, 2023 12:10 PM [name] E wrote: Reason for CRM: Pt called back to report that the pharmacy told her that the inhaler that has been called in for her is between 300 and 2,000 dollars. She cannot afford this and needs a more affordable alternative

## 2023-04-26 ENCOUNTER — Ambulatory Visit (INDEPENDENT_AMBULATORY_CARE_PROVIDER_SITE_OTHER): Payer: Medicare (Managed Care) | Admitting: Vascular Surgery

## 2023-04-26 ENCOUNTER — Telehealth: Payer: Self-pay

## 2023-04-26 ENCOUNTER — Telehealth: Payer: Self-pay | Admitting: Pharmacist

## 2023-04-26 ENCOUNTER — Other Ambulatory Visit: Payer: Self-pay | Admitting: Family Medicine

## 2023-04-26 ENCOUNTER — Other Ambulatory Visit: Payer: Self-pay | Admitting: Pharmacist

## 2023-04-26 ENCOUNTER — Encounter (INDEPENDENT_AMBULATORY_CARE_PROVIDER_SITE_OTHER): Payer: Self-pay | Admitting: Vascular Surgery

## 2023-04-26 VITALS — BP 147/81 | HR 61 | Resp 20 | Ht 67.0 in | Wt 273.6 lb

## 2023-04-26 DIAGNOSIS — I6523 Occlusion and stenosis of bilateral carotid arteries: Secondary | ICD-10-CM | POA: Diagnosis not present

## 2023-04-26 DIAGNOSIS — E785 Hyperlipidemia, unspecified: Secondary | ICD-10-CM | POA: Diagnosis not present

## 2023-04-26 DIAGNOSIS — I890 Lymphedema, not elsewhere classified: Secondary | ICD-10-CM

## 2023-04-26 DIAGNOSIS — J410 Simple chronic bronchitis: Secondary | ICD-10-CM

## 2023-04-26 DIAGNOSIS — I872 Venous insufficiency (chronic) (peripheral): Secondary | ICD-10-CM | POA: Diagnosis not present

## 2023-04-26 DIAGNOSIS — I10 Essential (primary) hypertension: Secondary | ICD-10-CM

## 2023-04-26 MED ORDER — TRELEGY ELLIPTA 100-62.5-25 MCG/ACT IN AEPB: 1.0000 | INHALATION_SPRAY | Freq: Every day | RESPIRATORY_TRACT | 0 refills | Status: DC

## 2023-04-26 NOTE — Progress Notes (Signed)
Dr. Carlynn Purl,  I received your referral message regarding assistance for this patient's inhaler and wanted to share the following:  1) Yes, the [id number] referral is the correct type for any pharmacy referral needs, no matter the patient's insurance status  2) Will assist patient with applying for [facility] patient assistance as she meets requirements for this program.  3) However, in the meantime, would you please send a 30 day supply prescription for the Trelegy inhaler to her [facility] as this is preferred for her [facility] plan over [facility]. Will then advise patient to restart using the Trelegy inhaler until able to receive the Breztri through PAP.  Thank you!  Estelle Grumbles, PharmD, [facility] [phone]

## 2023-04-26 NOTE — Telephone Encounter (Signed)
PAP: PAP application for Breztri, [employer] rights [employer] ([employer])) has been mailed to pt's home address on file. Will fax provider portion of application to provider's office when pt's portion is received.   PLEASE BE ADVISED

## 2023-04-26 NOTE — Telephone Encounter (Signed)
-----   Message from Manuela Neptune sent at 04/26/2023  3:38 PM EDT ----- Would you please help patient with applying for Breztri inhaler PAP as prescribed by Dr. Carlynn Purl?  Thank you!  Gentry Fitz

## 2023-04-26 NOTE — Patient Instructions (Addendum)
Goals Addressed             This Visit's Progress    Pharmacy Goals       It was a pleasure speaking with you today!  Please watch the mail for an envelope from [facility] containing the patient assistance program application. Please complete this application and bring to office to have it faxed back to Attention: Linward Foster at Fax # [phone] along with a copy of your Medicare Part D prescription card and a copy of your proof of income document.   If you need to call Joni Reining, you can reach her at [phone]  Check your blood pressure twice weekly, and any time you have concerning symptoms like headache, chest pain, dizziness, shortness of breath, or vision changes.   Our goal is less than 130/80.  To appropriately check your blood pressure, make sure you do the following:  1) Avoid caffeine, exercise, or tobacco products for 30 minutes before checking. Empty your bladder. 2) Sit with your back supported in a flat-backed chair. Rest your arm on something flat (arm of the chair, table, etc). 3) Sit still with your feet flat on the floor, resting, for at least 5 minutes.  4) Check your blood pressure. Take 1-2 readings.  5) Write down these readings and bring with you to any provider appointments.  Bring your home blood pressure machine with you to a provider's office for accuracy comparison at least once a year.   Make sure you take your blood pressure medications before you come to any office visit, even if you were asked to fast for labs.  Estelle Grumbles, PharmD, [facility] [phone]

## 2023-04-26 NOTE — Progress Notes (Signed)
04/26/2023 Name: Charlene Hardy MRN: [id number] DOB: 07-Apr-1956  Chief Complaint  Patient presents with   Medication Assistance    Charlene Hardy is a 67 y.o. year old female who presented for a telephone visit.   They were referred to the pharmacist by their PCP for assistance in managing medication access.    Subjective:  Care Team: Primary Care Provider: Alba Cory, MD ; Next Scheduled Visit: 06/03/2023 Cardiologist: Debbe Odea, MD Endocrinologist: Erlene Quan, MD  Vein and Vascular Specialist: Georgiana Spinner, NP; Next Scheduled Visit: Today  Medication Access/Adherence  Current Pharmacy:  [facility] [address]), [place] - [address] [place]) [place] [address] Phone: [phone] Fax: [phone]   Patient reports affordability concerns with their medications: Yes  Patient reports access/transportation concerns to their pharmacy: No  Patient reports adherence concerns with their medications:  No    Reports uses weekly pillbox to organize her medications  Reports recently started [facility] plan on 04/11/2023 and is now working on scheduling follow up appointments with her providers. Plans to contact and schedule follow up appointments with Cardiologist and Endocrinologist  COPD:  Current medications:  - albuterol HFA inhaler - 2 puffs every 6 hours as needed for wheezing/shortness of breath - Reports was unable to pick up Breztri inhaler, prescribed by PCP on 9/13, due to cost  Note patient currently taking prednisone 10 mg twice daily x 5 days as prescribed by PCP on 04/23/2023  Reports has identified triggers that worsen her breathing and is working to avoid these triggers   Medications tried in the past: Trelegy (cost)    Hypertension:  Current medications:  - amlodipine-benazepril 5-20 mg daily - metoprolol ER 50 mg daily - furosemide 20 mg daily as needed  Patient  does not have a validated, automated, upper arm home BP cuff; denies monitoring home blood pressure  Patient denies hypotensive s/sx including dizziness, lightheadedness.   Current physical activity: limited recently since retired a couple of months ago;    Objective:  Lab Results  Component Value Date   HGBA1C 6.1 (A) 04/23/2023    Lab Results  Component Value Date   CREATININE 0.8 09/03/2022   BUN 10 09/03/2022   NA 139 09/03/2022   K 4.7 09/03/2022   CL 103 09/03/2022   CO2 31 (A) 09/03/2022    Lab Results  Component Value Date   CHOL 143 09/03/2022   HDL 43 09/03/2022   LDLCALC 78 09/03/2022   TRIG 122 09/03/2022   CHOLHDL 3.0 10/21/2021   BP Readings from Last 3 Encounters:  04/26/23 (!) 147/81  04/23/23 134/80  10/21/22 134/76   Pulse Readings from Last 3 Encounters:  04/26/23 61  04/23/23 75  10/21/22 68     Medications Reviewed Today     Reviewed by Manuela Neptune, RPH-CPP (Pharmacist) on 04/26/23 at 0859  Med List Status: <None>   Medication Order Taking? Sig Documenting Provider Last Dose Status Informant  acetaminophen (TYLENOL) 500 MG tablet [id number] Yes Take 1 tablet (500 mg total) by mouth 2 (two) times daily. Alba Cory, MD Taking Active   albuterol (VENTOLIN HFA) 108 (90 Base) MCG/ACT inhaler [id number] Yes Inhale 2 puffs into the lungs every 6 (six) hours as needed for wheezing or shortness of breath. Alba Cory, MD Taking Active   amLODipine-benazepril (LOTREL) 5-20 MG capsule [id number] Yes Take 1 capsule by mouth daily. Alba Cory, MD Taking Active   aspirin 81 MG tablet  [id number] Yes Take 1 tablet by mouth daily. [provider] Taking Active            Med Note Laurell Josephs, [facility]   Fri Apr 18, 2021  8:46 AM)    atorvastatin (LIPITOR) 40 MG tablet [id number] Yes Take 1 tablet (40 mg total) by mouth daily. Alba Cory, MD Taking Active   Budeson-Glycopyrrol-Formoterol (BREZTRI AEROSPHERE) 160-9-4.8 MCG/ACT Sandrea Matte  [id number]  Inhale 2 puffs into the lungs 2 (two) times daily. Alba Cory, MD  Active   Cholecalciferol (VITAMIN D) 2000 UNITS tablet [id number] Yes Take 1 tablet by mouth daily. [provider] Taking Active            Med Note Laurell Josephs, [facility]   Fri Apr 18, 2021  8:46 AM)    ferrous sulfate 325 (65 FE) MG tablet [id number] Yes Take 1 tablet by mouth daily. [provider] Taking Active            Med Note Laurell Josephs, [facility]   Fri Apr 18, 2021  8:46 AM)    furosemide (LASIX) 20 MG tablet [id number] Yes Take 1 tablet (20 mg total) by mouth daily as needed. Alba Cory, MD Taking Active   gabapentin (NEURONTIN) 300 MG capsule [id number] Yes Take 1 capsule (300 mg total) by mouth 3 (three) times daily. Alba Cory, MD Taking Active   levothyroxine (SYNTHROID) 125 MCG tablet [id number] Yes Take 1 tablet (125 mcg total) by mouth in the morning. Alba Cory, MD Taking Active   metoprolol succinate (TOPROL-XL) 50 MG 24 hr tablet [id number] Yes Take 1 tablet (50 mg total) by mouth daily. Take with or immediately following a meal. Alba Cory, MD Taking Active   MULTIPLE VITAMINS-MINERALS ER PO [id number] Yes Take 1 tablet by mouth daily. [provider] Taking Active            Med Note Dollene Primrose   Fri Apr 18, 2021  8:46 AM)    naftifine (NAFTIN) 1 % cream [id number]  APPLY  CREAM TOPICALLY ONCE DAILY Alba Cory, MD  Active   omega-3 fish oil (MAXEPA) 1000 MG CAPS capsule [id number] Yes Take 1 capsule by mouth daily. [provider] Taking Active            Med Note Dollene Primrose   Fri Apr 18, 2021  8:46 AM)    Polyethylene Glycol 3350 POWD [id number] Yes Take 1 Dose by mouth as needed. [provider] Taking Active            Med Note Dollene Primrose   Fri Apr 18, 2021  8:46 AM)    potassium chloride SA (KLOR-CON M) 20 MEQ tablet [id number] Yes Take 1 tablet (20 mEq total) by mouth daily. Alba Cory, MD Taking Active   predniSONE  (DELTASONE) 10 MG tablet [id number] Yes Take 1 tablet (10 mg total) by mouth 2 (two) times daily with a meal. Alba Cory, MD Taking Active   triamcinolone cream (KENALOG) 0.1 % [id number]  Apply 1 application topically 2 (two) times daily. Mix with Eucerin or Nivea lotion ( at home  ) Alba Cory, MD  Active               Assessment/Plan:   Comprehensive medication review performed; medication list updated in electronic medical record - caution patient for risk of dizziness/sedation with gabapentin. Patient denies currently having dizziness or sedation with this medication  Identify patient scheduled for appointment with Vein and Vascular Specialist today. Patient follows up with Vein and  Vascular office and confirms will attend this appointment today  COPD: - Meets financial criteria for [facility] patient assistance program through [employer]. Will collaborate with provider, CPhT, and patient to pursue assistance.  - From review of prescription formulary for patient's Medicare prescription plan from [facility] website, find that Markus Daft is covered as a tier 4 option for patient, while Trelegy is a tier 3 option. Collaborate with PCP to request provider send a prescription for 1 month supply of Trelegy inhaler to pharmacy to patient to restart while applying for/waiting to receive Breztri through patient assistance - Reviewed appropriate inhaler technique. Encourage patient to use maintenance inhaler consistently  Hypertension: - Reviewed long term cardiovascular and renal outcomes of uncontrolled blood pressure - Reviewed appropriate blood pressure monitoring technique and reviewed goal blood pressure.  - Recommended to consider obtaining an upper arm blood pressure monitor through her Part D plan OTC benefit, check home blood pressure and heart rate, keep log of results and have this record to review at upcoming medical appointments. Patient to contact provider office sooner if needed for  readings outside of established parameters or symptoms   Follow Up Plan: Clinical Pharmacist will follow up with patient by telephone on 06/02/2023 at 8:30 AM   Estelle Grumbles, PharmD, [facility] [phone]

## 2023-05-06 DIAGNOSIS — E039 Hypothyroidism, unspecified: Secondary | ICD-10-CM | POA: Diagnosis not present

## 2023-05-06 DIAGNOSIS — E559 Vitamin D deficiency, unspecified: Secondary | ICD-10-CM | POA: Diagnosis not present

## 2023-05-06 DIAGNOSIS — E213 Hyperparathyroidism, unspecified: Secondary | ICD-10-CM | POA: Diagnosis not present

## 2023-05-11 ENCOUNTER — Ambulatory Visit (INDEPENDENT_AMBULATORY_CARE_PROVIDER_SITE_OTHER): Payer: Medicare (Managed Care) | Admitting: Nurse Practitioner

## 2023-05-11 ENCOUNTER — Ambulatory Visit (INDEPENDENT_AMBULATORY_CARE_PROVIDER_SITE_OTHER): Payer: Medicare (Managed Care)

## 2023-05-11 ENCOUNTER — Encounter (INDEPENDENT_AMBULATORY_CARE_PROVIDER_SITE_OTHER): Payer: Self-pay | Admitting: Nurse Practitioner

## 2023-05-11 VITALS — BP 127/78 | HR 56 | Resp 18 | Ht 67.0 in | Wt 268.4 lb

## 2023-05-11 DIAGNOSIS — I10 Essential (primary) hypertension: Secondary | ICD-10-CM | POA: Diagnosis not present

## 2023-05-11 DIAGNOSIS — E785 Hyperlipidemia, unspecified: Secondary | ICD-10-CM

## 2023-05-11 DIAGNOSIS — I6523 Occlusion and stenosis of bilateral carotid arteries: Secondary | ICD-10-CM

## 2023-05-11 MED ORDER — CLOPIDOGREL BISULFATE 75 MG PO TABS: 75.0000 mg | ORAL_TABLET | Freq: Every day | ORAL | 6 refills | Status: DC

## 2023-05-12 NOTE — Progress Notes (Signed)
Subjective:    Patient ID: Charlene Hardy, female    DOB: 1955/09/14, 67 y.o.   MRN: [id number] Chief Complaint  Patient presents with   Follow-up    pt conv carotid +    Charlene Hardy is a 67 year old female who returns today for follow-up of her carotid artery stenosis.  Initially this was prompted by a study done in 2019 which showed 60 to 79% stenosis of her right ICA and 40 to 59% stenosis of her left ICA.  She had not had evaluation since that time.  She notes that she has not had any TIA or almost a few drinks like symptoms.  She has had no CVA or any complaints of dizziness or syncope.  She denies any claudication-like symptoms.  Today the patient has a 1 to 39% stenosis of the right ICA with 40 to 59% stenosis of the left ICA.  However additional notes by the ultrasound technician note that the decreased velocities in the right ICA are indicative that there may be an early preocclusion but distally where it is unable to be imaged by ultrasound.    Review of Systems  All other systems reviewed and are negative.      Objective:   Physical Exam Vitals reviewed.  HENT:     Head: Normocephalic.  Neck:     Vascular: No carotid bruit.  Cardiovascular:     Rate and Rhythm: Normal rate.     Pulses: Normal pulses.  Pulmonary:     Effort: Pulmonary effort is normal.  Skin:    General: Skin is warm and dry.  Neurological:     Mental Status: She is alert and oriented to person, place, and time.  Psychiatric:        Mood and Affect: Mood normal.        Behavior: Behavior normal.        Thought Content: Thought content normal.        Judgment: Judgment normal.     BP 127/78 (BP Location: Right Arm)   Pulse (!) 56   Resp 18   Ht 5\' 7"  (1.702 m)   Wt 268 lb 6.4 oz (121.7 kg)   BMI 42.04 kg/m   Past Medical History:  Diagnosis Date   Arthritis    Diabetes mellitus without complication (HCC)    Heart murmur    Hyperlipidemia    Hypertension    Thyroid disease      Social History   Socioeconomic History   Marital status: Single    Spouse name: Not on file   Number of children: 0   Years of education: Not on file   Highest education level: High school graduate  Occupational History   Not on file  Tobacco Use   Smoking status: Every Day    Current packs/day: 1.00    Average packs/day: 1 pack/day for 46.8 years (46.8 ttl pk-yrs)    Types: Cigarettes    Start date: 08/10/1976   Smokeless tobacco: Never   Tobacco comments:    cutting down, smoking half pack lately   Vaping Use   Vaping status: Never Used  Substance and Sexual Activity   Alcohol use: No    Alcohol/week: 0.0 standard drinks of alcohol   Drug use: No   Sexual activity: Not Currently  Other Topics Concern   Not on file  Social History Narrative   Not on file   Social Determinants of Health   Financial Resource Strain: Low Risk  (10/21/2021)  Overall Financial Resource Strain (CARDIA)    Difficulty of Paying Living Expenses: Not hard at all  Food Insecurity: No Food Insecurity (10/21/2021)   Hunger Vital Sign    Worried About Running Out of Food in the Last Year: Never true    Ran Out of Food in the Last Year: Never true  Transportation Needs: No Transportation Needs (10/21/2021)   PRAPARE - Administrator, Civil Service (Medical): No    Lack of Transportation (Non-Medical): No  Physical Activity: Insufficiently Active (10/21/2021)   Exercise Vital Sign    Days of Exercise per Week: 3 days    Minutes of Exercise per Session: 30 min  Stress: No Stress Concern Present (10/21/2021)   [employer] of Occupational Health - Occupational Stress Questionnaire    Feeling of Stress : Not at all  Social Connections: Moderately Isolated (10/21/2021)   Social Connection and Isolation Panel [NHANES]    Frequency of Communication with Friends and Family: More than three times a week    Frequency of Social Gatherings with Friends and Family: Once a week    Attends  Religious Services: More than 4 times per year    Active Member of [employer] or Organizations: No    Attends Banker Meetings: Never    Marital Status: Never married  Intimate Partner Violence: Not At Risk (10/21/2021)   Humiliation, Afraid, Rape, and Kick questionnaire    Fear of Current or Ex-Partner: No    Emotionally Abused: No    Physically Abused: No    Sexually Abused: No    Past Surgical History:  Procedure Laterality Date   ABDOMINAL HYSTERECTOMY  08/11/1999   still has ovaries   BREAST BIOPSY Right 09/05/2018   Affirm Bx- Ribbon clip- path pending   COLONOSCOPY  08/11/2015   COLONOSCOPY WITH PROPOFOL N/A 06/10/2016   Procedure: COLONOSCOPY WITH PROPOFOL;  Surgeon: Kieth Brightly, MD;  Location: [facility] ENDOSCOPY;  Service: Endoscopy;  Laterality: N/A;   COLONOSCOPY WITH PROPOFOL N/A 06/11/2021   Procedure: COLONOSCOPY WITH PROPOFOL;  Surgeon: Earline Mayotte, MD;  Location: [facility] ENDOSCOPY;  Service: Endoscopy;  Laterality: N/A;    Family History  Problem Relation Age of Onset   Multiple sclerosis Mother    Diabetes Father    Breast cancer Sister 33   Other Sister        DDD - she had to have back surgery   Hypertension Maternal Grandmother    Thyroid disease Paternal Uncle    Breast cancer Paternal Aunt    Heart Problems Paternal Aunt     Allergies  Allergen Reactions   Hydrochlorothiazide     hyperparathyroidism   Pneumococcal Vaccine Rash   Pneumovax [Pneumococcal Polysaccharide Vaccine]        Latest Ref Rng & Units 09/03/2022   12:00 AM 11/04/2021   10:36 AM 10/28/2021    3:27 PM  CBC  WBC  9.0     9.8  12.4   Hemoglobin 12.0 - 16.0 13.4     12.8  12.6   Hematocrit 36 - 46 41     40.4  38.9   Platelets 150 - 400 K/uL 229     302  275      This result is from an external source.      CMP     Component Value Date/Time   NA 139 09/03/2022 0000   NA 133 (L) 08/18/2014 1941   K 4.7 09/03/2022 0000   K 3.9  08/18/2014 1941   CL 103  09/03/2022 0000   CL 100 08/18/2014 1941   CO2 31 (A) 09/03/2022 0000   CO2 28 08/18/2014 1941   GLUCOSE 85 10/21/2021 0957   GLUCOSE 109 (H) 08/18/2014 1941   BUN 10 09/03/2022 0000   BUN 9 08/18/2014 1941   CREATININE 0.8 09/03/2022 0000   CREATININE 0.69 10/21/2021 0957   CALCIUM 10.4 10/21/2022 0846   CALCIUM 9.7 08/18/2014 1941   PROT 6.9 10/21/2021 0957   PROT 6.8 04/25/2015 0857   PROT 7.2 08/18/2014 1941   ALBUMIN 4.0 09/03/2022 0000   ALBUMIN 4.0 04/25/2015 0857   ALBUMIN 3.5 08/18/2014 1941   AST 10 (A) 09/03/2022 0000   AST 11 (L) 08/18/2014 1941   ALT 7 09/03/2022 0000   ALT 18 08/18/2014 1941   ALKPHOS 112 09/03/2022 0000   ALKPHOS 105 08/18/2014 1941   BILITOT 0.5 10/21/2021 0957   BILITOT 0.3 04/25/2015 0857   BILITOT 0.4 08/18/2014 1941   EGFR 79 09/03/2022 0000   GFRNONAA 77 03/22/2020 1159     No results found.     Assessment & Plan:   1. Bilateral carotid artery stenosis The patient remains asymptomatic with respect to the carotid stenosis.  However, the patient has now progressed and has a lesion the is >70%.  Patient should undergo CT angiography of the carotid arteries to define the degree of stenosis of the internal carotid arteries bilaterally and the anatomic suitability for surgery vs. intervention.  If the patient does indeed need surgery cardiac clearance will be required, once cleared the patient will be scheduled for surgery.  The risks, benefits and alternative therapies were reviewed in detail with the patient.  All questions were answered.  The patient agrees to proceed with imaging.  Continue antiplatelet therapy as prescribed. Continue management of CAD, HTN and Hyperlipidemia. Healthy heart diet, encouraged exercise at least 4 times per week.  - CT ANGIO NECK W OR WO CONTRAST; Future  2. Essential (primary) hypertension Continue antihypertensive medications as already ordered, these medications have been reviewed and there are no  changes at this time.  3. Dyslipidemia Continue statin as ordered and reviewed, no changes at this time    Current Outpatient Medications on File Prior to Visit  Medication Sig Dispense Refill   acetaminophen (TYLENOL) 500 MG tablet Take 1 tablet (500 mg total) by mouth 2 (two) times daily. 30 tablet 0   albuterol (VENTOLIN HFA) 108 (90 Base) MCG/ACT inhaler Inhale 2 puffs into the lungs every 6 (six) hours as needed for wheezing or shortness of breath. 8 g 0   amLODipine-benazepril (LOTREL) 5-20 MG capsule Take 1 capsule by mouth daily. 90 capsule 1   aspirin 81 MG tablet Take 1 tablet by mouth daily.     atorvastatin (LIPITOR) 40 MG tablet Take 1 tablet (40 mg total) by mouth daily. 90 tablet 1   Budeson-Glycopyrrol-Formoterol (BREZTRI AEROSPHERE) 160-9-4.8 MCG/ACT AERO Inhale 2 puffs into the lungs 2 (two) times daily. 32.1 g 1   Cholecalciferol (VITAMIN D) 2000 UNITS tablet Take 1 tablet by mouth daily.     ferrous sulfate 325 (65 FE) MG tablet Take 1 tablet by mouth daily.     Fluticasone-Umeclidin-Vilant (TRELEGY ELLIPTA) 100-62.5-25 MCG/ACT AEPB Inhale 1 puff into the lungs daily. 1 each 0   furosemide (LASIX) 20 MG tablet Take 1 tablet (20 mg total) by mouth daily as needed. 90 tablet 1   gabapentin (NEURONTIN) 300 MG capsule Take 1 capsule (300  mg total) by mouth 3 (three) times daily. 270 capsule 1   levothyroxine (SYNTHROID) 125 MCG tablet Take 1 tablet (125 mcg total) by mouth in the morning. 90 tablet 0   metoprolol succinate (TOPROL-XL) 50 MG 24 hr tablet Take 1 tablet (50 mg total) by mouth daily. Take with or immediately following a meal. 90 tablet 1   MULTIPLE VITAMINS-MINERALS ER PO Take 1 tablet by mouth daily.     naftifine (NAFTIN) 1 % cream APPLY  CREAM TOPICALLY ONCE DAILY 90 g 0   omega-3 fish oil (MAXEPA) 1000 MG CAPS capsule Take 1 capsule by mouth daily.     Polyethylene Glycol 3350 POWD Take 1 Dose by mouth as needed.     potassium chloride SA (KLOR-CON M) 20 MEQ  tablet Take 1 tablet (20 mEq total) by mouth daily. 90 tablet 1   predniSONE (DELTASONE) 10 MG tablet Take 1 tablet (10 mg total) by mouth 2 (two) times daily with a meal. 10 tablet 0   triamcinolone cream (KENALOG) 0.1 % Apply 1 application topically 2 (two) times daily. Mix with Eucerin or Nivea lotion ( at home  ) 453.6 g 0   No current facility-administered medications on file prior to visit.    There are no Patient Instructions on file for this visit. No follow-ups on file.   Georgiana Spinner, NP

## 2023-05-14 ENCOUNTER — Telehealth (INDEPENDENT_AMBULATORY_CARE_PROVIDER_SITE_OTHER): Payer: Self-pay

## 2023-05-14 ENCOUNTER — Ambulatory Visit: Payer: Medicare (Managed Care) | Attending: Cardiology | Admitting: Cardiology

## 2023-05-14 ENCOUNTER — Encounter: Payer: Self-pay | Admitting: Cardiology

## 2023-05-14 VITALS — BP 120/62 | HR 78 | Ht 67.0 in | Wt 272.0 lb

## 2023-05-14 DIAGNOSIS — I10 Essential (primary) hypertension: Secondary | ICD-10-CM | POA: Diagnosis not present

## 2023-05-14 DIAGNOSIS — F17200 Nicotine dependence, unspecified, uncomplicated: Secondary | ICD-10-CM

## 2023-05-14 DIAGNOSIS — I6522 Occlusion and stenosis of left carotid artery: Secondary | ICD-10-CM | POA: Diagnosis not present

## 2023-05-14 DIAGNOSIS — I499 Cardiac arrhythmia, unspecified: Secondary | ICD-10-CM | POA: Diagnosis not present

## 2023-05-14 NOTE — Patient Instructions (Signed)
Medication Instructions:  No changes *If you need a refill on your cardiac medications before your next appointment, please call your pharmacy*  Lab Work: -None ordered  Testing/Procedures: -None ordered  Follow-Up: At [facility], you and your health needs are our priority.  As part of our continuing mission to provide you with exceptional heart care, we have created designated Provider Care Teams.  These Care Teams include your primary Cardiologist (physician) and Advanced Practice Providers (APPs -  Physician Assistants and Nurse Practitioners) who all work together to provide you with the care you need, when you need it.  Your next appointment:   1 year(s)  Provider:   You may see Debbe Odea, MD or one of the following Advanced Practice Providers on your designated Care Team:   Nicolasa Ducking, NP Eula Listen, PA-C Cadence Fransico Michael, PA-C Charlsie Quest, NP    Other Instructions -None

## 2023-05-14 NOTE — Telephone Encounter (Signed)
Attempted to call pt to let her know the CT scan has been approved no one answered LVM for pt to call office back

## 2023-05-14 NOTE — Progress Notes (Signed)
Cardiology Office Note:    Date:  05/14/2023   ID:  Charlene Hardy, DOB [birth year], MRN [id number]  PCP:  Alba Cory, MD  [facility] Cardiologist:  Debbe Odea, MD  [facility] Electrophysiologist:  None   Referring MD: Alba Cory, MD    Chief Complaint  Patient presents with   Follow-up    Patient denies new or acute cardiac problems/concerns today.      History of Present Illness:    Charlene Hardy is a 67 y.o. female with a hx of hypertension, hyperlipidemia, morbid obesity, moderate left carotid artery stenosis, current smoker x40+ years who presents for follow-up.    She still smokes, had an episode of bronchitis last week.  Being followed by vascular surgery due to history of moderate left carotid artery stenosis.  CT neck is being planned.  Compliant with aspirin, Lipitor as prescribed.  Denies chest pain.  Endorses eating/liking this is high in carbs such as pasta, potatoes, mac & cheese.  She still smokes.  Prior notes echocardiogram on 04/2018 showed normal systolic function, EF 55 to 60%, impaired relaxation, mild MR, mild LVH.    Past Medical History:  Diagnosis Date   Arthritis    Diabetes mellitus without complication (HCC)    Heart murmur    Hyperlipidemia    Hypertension    Thyroid disease     Past Surgical History:  Procedure Laterality Date   ABDOMINAL HYSTERECTOMY  08/11/1999   still has ovaries   BREAST BIOPSY Right 09/05/2018   Affirm Bx- Ribbon clip- path pending   COLONOSCOPY  08/11/2015   COLONOSCOPY WITH PROPOFOL N/A 06/10/2016   Procedure: COLONOSCOPY WITH PROPOFOL;  Surgeon: Kieth Brightly, MD;  Location: [facility] ENDOSCOPY;  Service: Endoscopy;  Laterality: N/A;   COLONOSCOPY WITH PROPOFOL N/A 06/11/2021   Procedure: COLONOSCOPY WITH PROPOFOL;  Surgeon: Earline Mayotte, MD;  Location: [facility] ENDOSCOPY;  Service: Endoscopy;  Laterality: N/A;    Current Medications: Current Meds  Medication Sig   acetaminophen  (TYLENOL) 500 MG tablet Take 1 tablet (500 mg total) by mouth 2 (two) times daily.   albuterol (VENTOLIN HFA) 108 (90 Base) MCG/ACT inhaler Inhale 2 puffs into the lungs every 6 (six) hours as needed for wheezing or shortness of breath.   amLODipine-benazepril (LOTREL) 5-20 MG capsule Take 1 capsule by mouth daily.   aspirin 81 MG tablet Take 1 tablet by mouth daily.   atorvastatin (LIPITOR) 40 MG tablet Take 1 tablet (40 mg total) by mouth daily.   Budeson-Glycopyrrol-Formoterol (BREZTRI AEROSPHERE) 160-9-4.8 MCG/ACT AERO Inhale 2 puffs into the lungs 2 (two) times daily.   Cholecalciferol (VITAMIN D) 2000 UNITS tablet Take 1 tablet by mouth daily.   clopidogrel (PLAVIX) 75 MG tablet Take 1 tablet (75 mg total) by mouth daily.   ferrous sulfate 325 (65 FE) MG tablet Take 1 tablet by mouth daily.   Fluticasone-Umeclidin-Vilant (TRELEGY ELLIPTA) 100-62.5-25 MCG/ACT AEPB Inhale 1 puff into the lungs daily.   furosemide (LASIX) 20 MG tablet Take 1 tablet (20 mg total) by mouth daily as needed.   gabapentin (NEURONTIN) 300 MG capsule Take 1 capsule (300 mg total) by mouth 3 (three) times daily.   levothyroxine (SYNTHROID) 125 MCG tablet Take 1 tablet (125 mcg total) by mouth in the morning.   metoprolol succinate (TOPROL-XL) 50 MG 24 hr tablet Take 1 tablet (50 mg total) by mouth daily. Take with or immediately following a meal.   MULTIPLE VITAMINS-MINERALS ER PO Take 1 tablet by mouth daily.  naftifine (NAFTIN) 1 % cream APPLY  CREAM TOPICALLY ONCE DAILY   omega-3 fish oil (MAXEPA) 1000 MG CAPS capsule Take 1 capsule by mouth daily.   Polyethylene Glycol 3350 POWD Take 1 Dose by mouth as needed.   potassium chloride SA (KLOR-CON M) 20 MEQ tablet Take 1 tablet (20 mEq total) by mouth daily.   triamcinolone cream (KENALOG) 0.1 % Apply 1 application topically 2 (two) times daily. Mix with Eucerin or Nivea lotion ( at home  )     Allergies:   Hydrochlorothiazide, Pneumococcal vaccine, and Pneumovax  [pneumococcal polysaccharide vaccine]   Social History   Socioeconomic History   Marital status: Single    Spouse name: Not on file   Number of children: 0   Years of education: Not on file   Highest education level: High school graduate  Occupational History   Not on file  Tobacco Use   Smoking status: Every Day    Current packs/day: 1.00    Average packs/day: 1 pack/day for 46.8 years (46.8 ttl pk-yrs)    Types: Cigarettes    Start date: 08/10/1976   Smokeless tobacco: Never   Tobacco comments:    cutting down, smoking half pack lately   Vaping Use   Vaping status: Never Used  Substance and Sexual Activity   Alcohol use: No    Alcohol/week: 0.0 standard drinks of alcohol   Drug use: No   Sexual activity: Not Currently  Other Topics Concern   Not on file  Social History Narrative   Not on file   Social Determinants of Health   Financial Resource Strain: Low Risk  (10/21/2021)   Overall Financial Resource Strain (CARDIA)    Difficulty of Paying Living Expenses: Not hard at all  Food Insecurity: No Food Insecurity (10/21/2021)   Hunger Vital Sign    Worried About Running Out of Food in the Last Year: Never true    Ran Out of Food in the Last Year: Never true  Transportation Needs: No Transportation Needs (10/21/2021)   PRAPARE - Administrator, Civil Service (Medical): No    Lack of Transportation (Non-Medical): No  Physical Activity: Insufficiently Active (10/21/2021)   Exercise Vital Sign    Days of Exercise per Week: 3 days    Minutes of Exercise per Session: 30 min  Stress: No Stress Concern Present (10/21/2021)   [employer] of Occupational Health - Occupational Stress Questionnaire    Feeling of Stress : Not at all  Social Connections: Moderately Isolated (10/21/2021)   Social Connection and Isolation Panel [NHANES]    Frequency of Communication with Friends and Family: More than three times a week    Frequency of Social Gatherings with Friends  and Family: Once a week    Attends Religious Services: More than 4 times per year    Active Member of [employer] or Organizations: No    Attends Engineer, structural: Never    Marital Status: Never married     Family History: The patient's family history includes Breast cancer in her paternal aunt; Breast cancer (age of onset: 30) in her sister; Diabetes in her father; Heart Problems in her paternal aunt; Hypertension in her maternal grandmother; Multiple sclerosis in her mother; Other in her sister; Thyroid disease in her paternal uncle.  ROS:   Please see the history of present illness.     All other systems reviewed and are negative.  EKGs/Labs/Other Studies Reviewed:    The following studies were  reviewed today:   EKG Interpretation Date/Time:  Friday May 14 2023 15:01:46 EDT Ventricular Rate:  78 PR Interval:  150 QRS Duration:  84 QT Interval:  368 QTC Calculation: 419 R Axis:   60  Text Interpretation: Normal sinus rhythm Septal infarct , age undetermined Confirmed by Debbe Odea ([id number]) on 05/14/2023 3:09:16 PM    Recent Labs: 09/03/2022: ALT 7; BUN 10; Creatinine 0.8; Hemoglobin 13.4; Platelets 229; Potassium 4.7; Sodium 139 10/21/2022: TSH 12.48  Recent Lipid Panel    Component Value Date/Time   CHOL 143 09/03/2022 0000   CHOL 150 04/25/2015 0857   TRIG 122 09/03/2022 0000   HDL 43 09/03/2022 0000   HDL 41 04/25/2015 0857   CHOLHDL 3.0 10/21/2021 0957   VLDL 26 11/02/2016 1013   LDLCALC 78 09/03/2022 0000   LDLCALC 50 10/21/2021 0957    Physical Exam:    VS:  BP 120/62 (BP Location: Left Arm, Patient Position: Sitting, Cuff Size: Large)   Pulse 78   Ht 5\' 7"  (1.702 m)   Wt 272 lb (123.4 kg)   SpO2 96%   BMI 42.60 kg/m     Wt Readings from Last 3 Encounters:  05/14/23 272 lb (123.4 kg)  05/11/23 268 lb 6.4 oz (121.7 kg)  04/26/23 273 lb 9.6 oz (124.1 kg)     GEN:  Well nourished, well developed in no acute distress HEENT:  Normal NECK: No JVD; No carotid bruits LYMPHATICS: No lymphadenopathy CARDIAC: Regular rate and rhythm RESPIRATORY: Rhonchorous breath sounds bilaterally ABDOMEN: Soft, non-tender, non-distended MUSCULOSKELETAL:  No edema; No deformity  SKIN: Warm and dry NEUROLOGIC:  Alert and oriented x 3 PSYCHIATRIC:  Normal affect   ASSESSMENT:    1. Irregular heart beats   2. Primary hypertension   3. Smoking   4. Morbid obesity (HCC)   5. Stenosis of left carotid artery    PLAN:    In order of problems listed above:  Irregular heartbeat  occasional PACs on EKG, symptoms controlled with beta-blocker. continue Toprol-XL.  Echo with normal EF. Hypertension, BP controlled.  Continue amlodipine, benazepril, Toprol XL Current smoker, smoking again cessation.. Obesity, advised on cutting back on carbs. Moderate left carotid artery stenosis, continue aspirin, Lipitor.  Monitoring as per vascular surgery.    Follow-up yearly.   Medication Adjustments/Labs and Tests Ordered: Current medicines are reviewed at length with the patient today.  Concerns regarding medicines are outlined above.  Orders Placed This Encounter  Procedures   EKG 12-Lead   No orders of the defined types were placed in this encounter.   Patient Instructions  Medication Instructions:  No changes *If you need a refill on your cardiac medications before your next appointment, please call your pharmacy*  Lab Work: -None ordered  Testing/Procedures: -None ordered  Follow-Up: At [facility], you and your health needs are our priority.  As part of our continuing mission to provide you with exceptional heart care, we have created designated Provider Care Teams.  These Care Teams include your primary Cardiologist (physician) and Advanced Practice Providers (APPs -  Physician Assistants and Nurse Practitioners) who all work together to provide you with the care you need, when you need it.  Your next appointment:    1 year(s)  Provider:   You may see Debbe Odea, MD or one of the following Advanced Practice Providers on your designated Care Team:   Nicolasa Ducking, NP Eula Listen, PA-C Cadence Fransico Michael, PA-C Charlsie Quest, NP    Other Instructions -  None    Signed, Debbe Odea, MD  05/14/2023 4:00 PM    [facility]

## 2023-05-21 ENCOUNTER — Ambulatory Visit: Admission: RE | Admit: 2023-05-21 | Payer: Medicare (Managed Care) | Source: Ambulatory Visit

## 2023-05-26 NOTE — Telephone Encounter (Signed)
Called pt and let her know that the insurance authorization has been done for the CT scan and she can call back to get her CT rescheduled.

## 2023-06-01 NOTE — Telephone Encounter (Signed)
FAXED PROVIDER PAGES TO KRICHNA SOWLES  for BREZTRI(AZ&ME)       PLEASE BE ADVISED

## 2023-06-02 ENCOUNTER — Telehealth: Payer: Self-pay | Admitting: Pharmacist

## 2023-06-02 ENCOUNTER — Other Ambulatory Visit: Payer: Self-pay | Admitting: Pharmacist

## 2023-06-02 ENCOUNTER — Ambulatory Visit
Admission: RE | Admit: 2023-06-02 | Discharge: 2023-06-02 | Disposition: A | Discharge status: Home / Self Care | Payer: Medicare (Managed Care) | Source: Ambulatory Visit | Attending: Nurse Practitioner | Admitting: Nurse Practitioner

## 2023-06-02 DIAGNOSIS — I6523 Occlusion and stenosis of bilateral carotid arteries: Secondary | ICD-10-CM | POA: Diagnosis not present

## 2023-06-02 DIAGNOSIS — I771 Stricture of artery: Secondary | ICD-10-CM | POA: Diagnosis not present

## 2023-06-02 DIAGNOSIS — J410 Simple chronic bronchitis: Secondary | ICD-10-CM

## 2023-06-02 DIAGNOSIS — I672 Cerebral atherosclerosis: Secondary | ICD-10-CM | POA: Diagnosis not present

## 2023-06-02 LAB — POCT I-STAT CREATININE: Creatinine, Ser: 0.8 mg/dL (ref 0.44–1.00)

## 2023-06-02 MED ORDER — IOHEXOL 350 MG/ML SOLN
75.0000 mL | Freq: Once | INTRAVENOUS | Status: AC | PRN
Start: 2023-06-02 — End: 2023-06-02
  Administered 2023-06-02: 75 mL via INTRAVENOUS

## 2023-06-02 NOTE — Progress Notes (Signed)
Dr. Carlynn Purl,  Patient has appointment with you tomorrow, but reaching out as she is requesting refill of her Trelegy inhaler (has run out) and albuterol inhaler. Note currently aiding patient with applying for the Breztri PAP, but not yet complete.  Would you please consider sending renewals of both Trelegy and albuterol inhalers to pharmacy for patient? (Renewals pended to you)  Also, today discussed Ozempic. Note she was on this in the past, but stopped due to cost. Today patient expresses her appetite control/weight as a barrier to smoking cessation. Discussed benefit that she had in the past with Ozempic, both for blood sugar control and on appetite. If okay with you, we would be happy to help patient to apply for Ozempic PAP  Thank you!  Gentry Fitz

## 2023-06-02 NOTE — Progress Notes (Unsigned)
Patient: Charlene Hardy, Female    DOB: [birth year], 67 y.o.   MRN: [id number]  Visit Date: 06/02/2023  Today's Provider: Ruel Favors, MD   Welcome to Medicare  Subjective:    HPI Charlene Hardy is a 67 y.o. female who presents today for her Subsequent Annual Wellness Visit.  Patient/Caregiver input:  right knee pain  Review of Systems  Constitutional: Negative for fever or weight change.  Respiratory: Negative for cough and shortness of breath.   Cardiovascular: Negative for chest pain or palpitations.  Gastrointestinal: Negative for abdominal pain, no bowel changes.  Musculoskeletal: positive  for gait problem , she has right knee joint swelling.  Skin: Negative for rash.  Neurological: Negative for dizziness or headache.  No other specific complaints in a complete review of systems (except as listed in HPI above).  Past Medical History:  Diagnosis Date   Arthritis    Diabetes mellitus without complication (HCC)    Heart murmur    Hyperlipidemia    Hypertension    Thyroid disease     Past Surgical History:  Procedure Laterality Date   ABDOMINAL HYSTERECTOMY  08/11/1999   still has ovaries   BREAST BIOPSY Right 09/05/2018   Affirm Bx- Ribbon clip- path pending   COLONOSCOPY  08/11/2015   COLONOSCOPY WITH PROPOFOL N/A 06/10/2016   Procedure: COLONOSCOPY WITH PROPOFOL;  Surgeon: Kieth Brightly, MD;  Location: [facility];  Service: Endoscopy;  Laterality: N/A;   COLONOSCOPY WITH PROPOFOL N/A 06/11/2021   Procedure: COLONOSCOPY WITH PROPOFOL;  Surgeon: Earline Mayotte, MD;  Location: [facility];  Service: Endoscopy;  Laterality: N/A;    Family History  Problem Relation Age of Onset   Multiple sclerosis Mother    Diabetes Father    Breast cancer Sister 57   Other Sister        DDD - she had to have back surgery   Hypertension Maternal Grandmother    Thyroid disease Paternal Uncle    Breast cancer Paternal Aunt    Heart Problems Paternal Aunt      Social History   Socioeconomic History   Marital status: Single    Spouse name: Not on file   Number of children: 0   Years of education: Not on file   Highest education level: High school graduate  Occupational History   Not on file  Tobacco Use   Smoking status: Every Day    Current packs/day: 1.00    Average packs/day: 1 pack/day for 46.8 years (46.8 ttl pk-yrs)    Types: Cigarettes    Start date: 08/10/1976   Smokeless tobacco: Never   Tobacco comments:    cutting down, smoking half pack lately   Vaping Use   Vaping status: Never Used  Substance and Sexual Activity   Alcohol use: No    Alcohol/week: 0.0 standard drinks of alcohol   Drug use: No   Sexual activity: Not Currently  Other Topics Concern   Not on file  Social History Narrative   Not on file   Social Determinants of Health   Financial Resource Strain: Low Risk  (10/21/2021)   Overall Financial Resource Strain (CARDIA)    Difficulty of Paying Living Expenses: Not hard at all  Food Insecurity: No Food Insecurity (10/21/2021)   Hunger Vital Sign    Worried About Running Out of Food in the Last Year: Never true    Ran Out of Food in the Last Year: Never true  Transportation Needs: No Transportation Needs (10/21/2021)  PRAPARE - Administrator, Civil Service (Medical): No    Lack of Transportation (Non-Medical): No  Physical Activity: Insufficiently Active (10/21/2021)   Exercise Vital Sign    Days of Exercise per Week: 3 days    Minutes of Exercise per Session: 30 min  Stress: No Stress Concern Present (10/21/2021)   [employer] of Occupational Health - Occupational Stress Questionnaire    Feeling of Stress : Not at all  Social Connections: Moderately Isolated (10/21/2021)   Social Connection and Isolation Panel [NHANES]    Frequency of Communication with Friends and Family: More than three times a week    Frequency of Social Gatherings with Friends and Family: Once a week    Attends  Religious Services: More than 4 times per year    Active Member of [employer] or Organizations: No    Attends Banker Meetings: Never    Marital Status: Never married  Intimate Partner Violence: Not At Risk (10/21/2021)   Humiliation, Afraid, Rape, and Kick questionnaire    Fear of Current or Ex-Partner: No    Emotionally Abused: No    Physically Abused: No    Sexually Abused: No    Outpatient Encounter Medications as of 06/03/2023  Medication Sig   acetaminophen (TYLENOL) 500 MG tablet Take 1 tablet (500 mg total) by mouth 2 (two) times daily.   albuterol (VENTOLIN HFA) 108 (90 Base) MCG/ACT inhaler Inhale 2 puffs into the lungs every 6 (six) hours as needed for wheezing or shortness of breath.   amLODipine-benazepril (LOTREL) 5-20 MG capsule Take 1 capsule by mouth daily.   aspirin 81 MG tablet Take 1 tablet by mouth daily.   atorvastatin (LIPITOR) 40 MG tablet Take 1 tablet (40 mg total) by mouth daily.   Budeson-Glycopyrrol-Formoterol (BREZTRI AEROSPHERE) 160-9-4.8 MCG/ACT AERO Inhale 2 puffs into the lungs 2 (two) times daily.   Cholecalciferol (VITAMIN D) 2000 UNITS tablet Take 1 tablet by mouth daily.   clopidogrel (PLAVIX) 75 MG tablet Take 1 tablet (75 mg total) by mouth daily.   ferrous sulfate 325 (65 FE) MG tablet Take 1 tablet by mouth daily.   Fluticasone-Umeclidin-Vilant (TRELEGY ELLIPTA) 100-62.5-25 MCG/ACT AEPB Inhale 1 puff into the lungs daily.   furosemide (LASIX) 20 MG tablet Take 1 tablet (20 mg total) by mouth daily as needed.   gabapentin (NEURONTIN) 300 MG capsule Take 1 capsule (300 mg total) by mouth 3 (three) times daily.   levothyroxine (SYNTHROID) 137 MCG tablet Take 137 mcg by mouth every morning.   metoprolol succinate (TOPROL-XL) 50 MG 24 hr tablet Take 1 tablet (50 mg total) by mouth daily. Take with or immediately following a meal.   MULTIPLE VITAMINS-MINERALS ER PO Take 1 tablet by mouth daily.   naftifine (NAFTIN) 1 % cream APPLY  CREAM  TOPICALLY ONCE DAILY   omega-3 fish oil (MAXEPA) 1000 MG CAPS capsule Take 1 capsule by mouth daily.   Polyethylene Glycol 3350 POWD Take 1 Dose by mouth as needed.   potassium chloride SA (KLOR-CON M) 20 MEQ tablet Take 1 tablet (20 mEq total) by mouth daily.   triamcinolone cream (KENALOG) 0.1 % Apply 1 application topically 2 (two) times daily. Mix with Eucerin or Nivea lotion ( at home  )   No facility-administered encounter medications on file as of 06/03/2023.    Allergies  Allergen Reactions   Hydrochlorothiazide     hyperparathyroidism   Pneumococcal Vaccine Rash   Pneumovax [Pneumococcal Polysaccharide Vaccine]  Care Team Updated in EHR: Yes  Last Vision Exam: last year  Wears corrective lenses: Yes Last Dental Exam: not up to date, not sure when  Last Hearing Exam: screen today  Wears Hearing Aids: No  Functional Ability / Safety Screening 1.  Was the timed Get Up and Go test shorter than 30 seconds?  yes 2.  Does the patient need help with the phone, transportation, shopping,      preparing meals, housework, laundry, medications, or managing money?  no 3.  Is the patient's home free of loose throw rugs in walkways, pet beds, electrical cords, etc?   no      Grab bars in the bathroom? no      Handrails on the stairs?   Yes       Adequate lighting?   yes 4.  Has the patient noticed any hearing difficulties?   no  Diet Recall and Exercise Regimen: avoiding sweets, eats lots of vegetables, states can do better with her diet. She has right knee pain and has not been exercising   Advanced Care Planning: A voluntary discussion about advance care planning including the explanation and discussion of advance directives.  Discussed health care proxy and Living will, and the patient was able to identify a health care proxy as sister - Rockwell Alexandria .  Patient does not have a living will at present time. If patient does have living will, I have requested they bring this to the clinic  to be scanned in to their chart. Does patient have a HCPOA?    no If yes, name and contact information: N/A Does patient have a living will or MOST form?  no  Cancer Screenings: Skin: no atypical lesions  Lung: Low Dose CT Chest recommended if Age 78-80 years, 30 pack-year currently smoking OR have quit w/in 15years. Patient qualifies for lung cancer screen Breast: Up to date on Mammogram? No   Up to date of Bone Density/Dexa? no Colon: 06/11/21, repeat in 2027  Additional Screenings: Hepatitis B/HIV/Syphillis: N/A Hepatitis C Screening: 04/05/12 Intimate Partner Violence: Negative  Objective:   Vitals: There were no vitals taken for this visit. There is no height or weight on file to calculate BMI.  No results found.  Physical Exam Constitutional: Patient appears well-developed and well-nourished. Obese  No distress.  HEENT: head atraumatic, normocephalic, pupils equal and reactive to light, neck supple Cardiovascular: Normal rate, regular rhythm and normal heart sounds.  No murmur heard. No BLE edema. Pulmonary/Chest: Effort normal and breath sounds normal. No respiratory distress. Abdominal: Soft.  There is no tenderness. Psychiatric: Patient has a normal mood and affect. behavior is normal. Judgment and thought content normal.  Cognitive Testing - 6-CIT  Correct? Score   What year is it? yes 0 Yes = 0    No = 4  What month is it? yes 0 Yes = 0    No = 3  Remember:     Fl[place]de [place]ins, [address] [place], [place]     What time is it? yes 0 Yes = 0    No = 3  Count backwards from 20 to 1 yes 0 Correct = 0    1 error = 2   More than 1 error = 4  Say the months of the year in reverse. yes 1 Correct = 0    1 error = 2   More than 1 error = 4  What address did I ask you to remember? yes 0  Correct = 0  1 error = 2    2 error = 4    3 error = 6    4 error = 8    All wrong = 10       TOTAL SCORE  1/28   Interpretation:  Normal  Normal (0-7) Abnormal (8-28)   Fall Risk:    04/23/2023     9:50 AM 10/21/2022    8:01 AM 04/21/2022    9:07 AM 10/21/2021    8:47 AM 04/18/2021    8:46 AM  Fall Risk   Falls in the past year? 0 0 0 0 0  Number falls in past yr: 0 0 0 0 0  Injury with Fall? 0 0 0 0 0  Risk for fall due to : No Fall Risks No Fall Risks No Fall Risks No Fall Risks No Fall Risks  Follow up Falls prevention discussed;Education provided;Falls evaluation completed Falls prevention discussed Falls prevention discussed Falls prevention discussed Falls prevention discussed    Depression Screen    04/23/2023    9:50 AM 10/21/2022    8:01 AM 04/21/2022    9:07 AM 10/21/2021    8:47 AM 04/18/2021    8:46 AM  Depression screen PHQ 2/9  Decreased Interest 0 0 0 0 0  Down, Depressed, Hopeless 0 0 0 0 0  PHQ - 2 Score 0 0 0 0 0  Altered sleeping 0 0 0 0   Tired, decreased energy 0 0 0 0   Change in appetite 0 0 0 0   Feeling bad or failure about yourself  0 0 0 0   Trouble concentrating 0 0 0 0   Moving slowly or fidgety/restless 0 0 0 0   Suicidal thoughts 0 0 0 0   PHQ-9 Score 0 0 0 0   Difficult doing work/chores Not difficult at all        Recent Results (from the past 2160 hour(s))  POCT HgB A1C     Status: Abnormal   Collection Time: 04/23/23 10:03 AM  Result Value Ref Range   Hemoglobin A1C 6.1 (A) 4.0 - 5.6 %   HbA1c POC (<> result, manual entry)     HbA1c, POC (prediabetic range)     HbA1c, POC (controlled diabetic range)      Assessment & Plan:    1. Welcome to Medicare preventive visit   Exercise Activities and Dietary recommendations  Goals      Pharmacy Goals     It was a pleasure speaking with you today!  Please watch the mail for an envelope from [employer] containing the patient assistance program application. Please complete this application and mail back to [facility] Pharmacy Technician Noreene Larsson [place] along with a copy of your Medicare Part D prescription card and a copy of your proof of income document OR you can bring these documents to  the office to have them faxed back to Attention: Pattricia Boss at Fax # [phone]   If you need to call Noreene Larsson, you can reach her at [phone]  Check your blood pressure twice weekly, and any time you have concerning symptoms like headache, chest pain, dizziness, shortness of breath, or vision changes.   Our goal is less than 130/80.  To appropriately check your blood pressure, make sure you do the following:  1) Avoid caffeine, exercise, or tobacco products for 30 minutes before checking. Empty your bladder. 2) Sit with your back supported in a flat-backed chair. Rest  your arm on something flat (arm of the chair, table, etc). 3) Sit still with your feet flat on the floor, resting, for at least 5 minutes.  4) Check your blood pressure. Take 1-2 readings.  5) Write down these readings and bring with you to any provider appointments.  Bring your home blood pressure machine with you to a provider's office for accuracy comparison at least once a year.   Make sure you take your blood pressure medications before you come to any office visit, even if you were asked to fast for labs.  Estelle Grumbles, PharmD, [facility] [phone]         - Discussed health benefits of physical activity, and encouraged her to engage in regular exercise appropriate for her age and condition.   Immunization History  Administered Date(s) Administered   Fluad Quad(high Dose 65+) 04/18/2021, 04/21/2022   Influenza,inj,Quad PF,6+ Mos 04/25/2015, 05/25/2016, 05/28/2017, 05/31/2018, 04/25/2019   Influenza-Unspecified 03/16/2014, 06/05/2020   PFIZER(Purple Top)SARS-COV-2 Vaccination 11/30/2019, 12/22/2019   PNEUMOCOCCAL CONJUGATE-20 10/21/2021   Pneumococcal Conjugate-13 07/17/2014   Pneumococcal Polysaccharide-23 05/27/2010   Tdap 01/03/2008, 05/31/2018   Zoster Recombinant(Shingrix) 06/25/2020, 10/16/2020   Zoster, Live 05/27/2010    Health Maintenance  Topic Date Due    OPHTHALMOLOGY EXAM  07/16/2018   COVID-19 Vaccine (3 - Pfizer risk series) 01/19/2020   INFLUENZA VACCINE  11/08/2023 (Originally 03/11/2023)   Lung Cancer Screening  04/22/2024 (Originally 12/18/2021)   Diabetic kidney evaluation - eGFR measurement  09/04/2023   Diabetic kidney evaluation - Urine ACR  10/21/2023   HEMOGLOBIN A1C  10/21/2023   FOOT EXAM  04/22/2024   Medicare Annual Wellness (AWV)  06/02/2024   MAMMOGRAM  10/13/2024   Colonoscopy  06/11/2026   DTaP/Tdap/Td (3 - Td or Tdap) 05/31/2028   Pneumonia Vaccine 67+ Years old  Completed   DEXA SCAN  Completed   Hepatitis C Screening  Completed   Zoster Vaccines- Shingrix  Completed   HPV VACCINES  Aged Out    No orders of the defined types were placed in this encounter.   Current Outpatient Medications:    acetaminophen (TYLENOL) 500 MG tablet, Take 1 tablet (500 mg total) by mouth 2 (two) times daily., Disp: 30 tablet, Rfl: 0   albuterol (VENTOLIN HFA) 108 (90 Base) MCG/ACT inhaler, Inhale 2 puffs into the lungs every 6 (six) hours as needed for wheezing or shortness of breath., Disp: 8 g, Rfl: 0   amLODipine-benazepril (LOTREL) 5-20 MG capsule, Take 1 capsule by mouth daily., Disp: 90 capsule, Rfl: 1   aspirin 81 MG tablet, Take 1 tablet by mouth daily., Disp: , Rfl:    atorvastatin (LIPITOR) 40 MG tablet, Take 1 tablet (40 mg total) by mouth daily., Disp: 90 tablet, Rfl: 1   Budeson-Glycopyrrol-Formoterol (BREZTRI AEROSPHERE) 160-9-4.8 MCG/ACT AERO, Inhale 2 puffs into the lungs 2 (two) times daily., Disp: 32.1 g, Rfl: 1   Cholecalciferol (VITAMIN D) 2000 UNITS tablet, Take 1 tablet by mouth daily., Disp: , Rfl:    clopidogrel (PLAVIX) 75 MG tablet, Take 1 tablet (75 mg total) by mouth daily., Disp: 30 tablet, Rfl: 6   ferrous sulfate 325 (65 FE) MG tablet, Take 1 tablet by mouth daily., Disp: , Rfl:    Fluticasone-Umeclidin-Vilant (TRELEGY ELLIPTA) 100-62.5-25 MCG/ACT AEPB, Inhale 1 puff into the lungs daily., Disp: 1 each,  Rfl: 0   furosemide (LASIX) 20 MG tablet, Take 1 tablet (20 mg total) by mouth daily as needed., Disp: 90 tablet, Rfl: 1  gabapentin (NEURONTIN) 300 MG capsule, Take 1 capsule (300 mg total) by mouth 3 (three) times daily., Disp: 270 capsule, Rfl: 1   levothyroxine (SYNTHROID) 137 MCG tablet, Take 137 mcg by mouth every morning., Disp: , Rfl:    metoprolol succinate (TOPROL-XL) 50 MG 24 hr tablet, Take 1 tablet (50 mg total) by mouth daily. Take with or immediately following a meal., Disp: 90 tablet, Rfl: 1   MULTIPLE VITAMINS-MINERALS ER PO, Take 1 tablet by mouth daily., Disp: , Rfl:    naftifine (NAFTIN) 1 % cream, APPLY  CREAM TOPICALLY ONCE DAILY, Disp: 90 g, Rfl: 0   omega-3 fish oil (MAXEPA) 1000 MG CAPS capsule, Take 1 capsule by mouth daily., Disp: , Rfl:    Polyethylene Glycol 3350 POWD, Take 1 Dose by mouth as needed., Disp: , Rfl:    potassium chloride SA (KLOR-CON M) 20 MEQ tablet, Take 1 tablet (20 mEq total) by mouth daily., Disp: 90 tablet, Rfl: 1   triamcinolone cream (KENALOG) 0.1 %, Apply 1 application topically 2 (two) times daily. Mix with Eucerin or Nivea lotion ( at home  ), Disp: 453.6 g, Rfl: 0 There are no discontinued medications.  I have personally reviewed and addressed the Medicare Annual Wellness health risk assessment questionnaire and have noted the following in the patient's chart:  A.         Medical and social history & family history B.         Use of alcohol, tobacco, and illicit drugs  C.         Current medications and supplements D.         Functional and Cognitive ability and status E.         Nutritional status F.         Physical activity G.        Advance directives H.         List of other physicians I.          Hospitalizations, surgeries, and ER visits in previous 12 months J.         Vitals K.         Screenings such as hearing, vision, cognitive function, and depression L.         Referrals and appointments: yes  In addition, I have  reviewed and discussed with patient certain preventive protocols, quality metrics, and best practice recommendations. A written personalized care plan for preventive services as well as general preventive health recommendations were provided to patient.   See attached scanned questionnaire for additional information.

## 2023-06-02 NOTE — Progress Notes (Unsigned)
06/02/2023 Name: Charlene Hardy MRN: [id number] DOB: [birth year]  Chief Complaint  Patient presents with   Medication Management    Charlene Hardy is a 67 y.o. year old female who presented for a telephone visit.   They were referred to the pharmacist by their PCP for assistance in managing medication access.      Subjective:   Care Team: Primary Care Provider: Alba Cory, MD ; Next Scheduled Visit: 06/03/2023 Cardiologist: Debbe Odea, MD Endocrinologist: Erlene Quan, MD  Vein and Vascular Specialist: Georgiana Spinner, NP; Next Scheduled Visit: 06/15/2023   Medication Access/Adherence  Current Pharmacy:  [facility] [address]), [place] - [address] [place]) [place] [address] Phone: [phone] Fax: [phone]   Patient reports affordability concerns with their medications: Yes  Patient reports access/transportation concerns to their pharmacy: No  Patient reports adherence concerns with their medications:  No     Reports uses weekly pillbox to organize her medications    COPD:   Current medications:  - albuterol HFA inhaler - 2 puffs every 6 hours as needed for wheezing/shortness of breath - Trelegy 100-62.5-25 mcg - 1 puff daily  Note plan to switch patient from Trelegy to [facility] maintenance inhaler once patient able to receive this through patient assistance program   Patient requesting refill of both her Trelegy and albuterol inhalers  Reports tries to avoid triggers that worsen her breathing, such as cologne    Current medication access support: collaborating with PCP and CPhT to apply for patient assistance for Breztri from [employer] - From review of chart, appears CPhT has received completed patient portion of application and faxed provider portion to Dr. Carlynn Purl yesterday     Hypertension:   Current medications:  - amlodipine-benazepril 5-20 mg daily - metoprolol ER 50 mg  daily - furosemide 20 mg daily as needed   Reports currently using ~4 days/week  Patient does not have a validated, automated, upper arm home BP cuff; denies monitoring home blood pressure   Patient denies hypotensive s/sx including dizziness, lightheadedness.    Current physical activity: limited recently since retired a couple of months ago    Tobacco Abuse:  Reports currently smokes ~1.5 pack/day  Triggers: stress  Barriers: appetite suppression with smoking; concerned about gaining weight if stops  Reports has previously tried nicotine patches, but did not notice benefit  Denies interest in setting quit date at this time, but plans to work on cutting back   Diabetes:  Current medications: none  Medications tried in the past: Ozempic (cost)  Denies monitoring home blood sugar  Reports has made changes to improve her diet/limit portion sizes, but shares that she finds it difficult to control her appetite. - Expresses interest in restarting Ozempic for blood sugar control/appetite control if able to afford the medication   Objective:  Lab Results  Component Value Date   HGBA1C 6.1 (A) 04/23/2023    Lab Results  Component Value Date   CREATININE 0.80 06/02/2023   BUN 10 09/03/2022   NA 139 09/03/2022   K 4.7 09/03/2022   CL 103 09/03/2022   CO2 31 (A) 09/03/2022    Lab Results  Component Value Date   CHOL 143 09/03/2022   HDL 43 09/03/2022   LDLCALC 78 09/03/2022   TRIG 122 09/03/2022   CHOLHDL 3.0 10/21/2021   BP Readings from Last 3 Encounters:  06/03/23 122/68  05/14/23 120/62  05/11/23 127/78   Pulse Readings from Last  3 Encounters:  06/03/23 86  05/14/23 78  05/11/23 (!) 56    Medications Reviewed Today     Reviewed by Manuela Neptune, RPH-CPP (Pharmacist) on 06/02/23 at 2113  Med List Status: <None>   Medication Order Taking? Sig Documenting Provider Last Dose Status Informant  acetaminophen (TYLENOL) 500 MG tablet [id number]  Take  1 tablet (500 mg total) by mouth 2 (two) times daily. Alba Cory, MD  Active   albuterol (VENTOLIN HFA) 108 (90 Base) MCG/ACT inhaler [id number] Yes Inhale 2 puffs into the lungs every 6 (six) hours as needed for wheezing or shortness of breath. Alba Cory, MD Taking Active   amLODipine-benazepril (LOTREL) 5-20 MG capsule [id number]  Take 1 capsule by mouth daily. Alba Cory, MD  Active   aspirin 81 MG tablet [id number]  Take 1 tablet by mouth daily. [provider]  Active            Med Note Laurell Josephs, [facility]   Fri Apr 18, 2021  8:46 AM)    atorvastatin (LIPITOR) 40 MG tablet [id number]  Take 1 tablet (40 mg total) by mouth daily. Alba Cory, MD  Active   Budeson-Glycopyrrol-Formoterol (BREZTRI AEROSPHERE) 160-9-4.8 MCG/ACT Sandrea Matte [id number]  Inhale 2 puffs into the lungs 2 (two) times daily. Alba Cory, MD  Active   Cholecalciferol (VITAMIN D) 2000 UNITS tablet [id number]  Take 1 tablet by mouth daily. [provider]  Active            Med Note Laurell Josephs, [facility]   Fri Apr 18, 2021  8:46 AM)    clopidogrel (PLAVIX) 75 MG tablet [id number]  Take 1 tablet (75 mg total) by mouth daily. Georgiana Spinner, NP  Active   ferrous sulfate 325 (65 FE) MG tablet [id number]  Take 1 tablet by mouth daily. [provider]  Active            Med Note Laurell Josephs, [facility]   Fri Apr 18, 2021  8:46 AM)    Fluticasone-Umeclidin-Vilant (TRELEGY ELLIPTA) 100-62.5-25 MCG/ACT AEPB [id number] Yes Inhale 1 puff into the lungs daily. Alba Cory, MD Taking Active   furosemide (LASIX) 20 MG tablet [id number]  Take 1 tablet (20 mg total) by mouth daily as needed. Alba Cory, MD  Active   gabapentin (NEURONTIN) 300 MG capsule [id number]  Take 1 capsule (300 mg total) by mouth 3 (three) times daily. Alba Cory, MD  Active   levothyroxine (SYNTHROID) 137 MCG tablet [id number] Yes Take 137 mcg by mouth every morning. [provider]  Active   metoprolol succinate (TOPROL-XL) 50  MG 24 hr tablet [id number]  Take 1 tablet (50 mg total) by mouth daily. Take with or immediately following a meal. Alba Cory, MD  Active   MULTIPLE VITAMINS-MINERALS ER PO [id number]  Take 1 tablet by mouth daily. [provider]  Active            Med Note Laurell Josephs, [facility]   Fri Apr 18, 2021  8:46 AM)    naftifine (NAFTIN) 1 % cream [id number]  APPLY  CREAM TOPICALLY ONCE DAILY Alba Cory, MD  Active   omega-3 fish oil (MAXEPA) 1000 MG CAPS capsule [id number]  Take 1 capsule by mouth daily. [provider]  Active            Med Note Laurell Josephs, [facility]   Fri Apr 18, 2021  8:46 AM)    Polyethylene Glycol 3350 POWD [id number]  Take 1 Dose by mouth as needed. [provider]  Active  Med Note Dollene Primrose   Fri Apr 18, 2021  8:46 AM)    potassium chloride SA (KLOR-CON M) 20 MEQ tablet [id number]  Take 1 tablet (20 mEq total) by mouth daily. Alba Cory, MD  Active   triamcinolone cream (KENALOG) 0.1 % [id number]  Apply 1 application topically 2 (two) times daily. Mix with Eucerin or Nivea lotion ( at home  ) Alba Cory, MD  Active               Assessment/Plan:   Identify patient scheduled for appointment for CT scan this afternoon. Patient will plan to attend appointment as scheduled  COPD: - Reviewed appropriate inhaler technique. - Collaborating with PCP and CPhT to apply for patient assistance for Breztri from [employer] - Send message to PCP requesting renewal of both patient's Trelegy inhaler (and albuterol inhaler) for patient to continue while awaiting approval for/supply of Breztri inhaler from assistance program   Hypertension: - Have reviewed long term cardiovascular and renal outcomes of uncontrolled blood pressure - Have reviewed appropriate blood pressure monitoring technique and reviewed goal blood pressure.  - Recommended to consider obtaining an upper arm blood pressure monitor through her Part D plan OTC benefit, check home  blood pressure and heart rate, keep log of results and have this record to review at upcoming medical appointments. Patient to contact provider office sooner if needed for readings outside of established parameters or symptoms  Tobacco Abuse - Provided motivational interviewing to assess tobacco use and strategies for reduction  Diabetes: - Currently controlled - Collaborate with PCP regarding patient's interest in restarting Ozempic for blood sugar control and on weight control if affordable.  Recommend having patient restart Ozempic 0.25 mg weekly x 4 weeks and then increasing to Ozempic 0.5 mg weekly. Provider agrees and advises would plan for patient to further increase dose to Ozempic 1 mg weekly after completes at least 4 weeks of Ozempic 0.5 mg weekly strength and tolerating well Provider advises clinic able to provide patient with samples of Ozempic 0.25 mg/0.5 mg pen to restart and will contact patient to have her pick up - Meets financial criteria for Ozempic patient assistance program through [employer]. Will collaborate with provider, CPhT, and patient to pursue assistance for 2025 calendar year for Ozempic 1 mg weekly strength.   Follow Up Plan: Clinical Pharmacist will follow up with patient by telephone on 07/07/2023 at 8:30 am  Estelle Grumbles, PharmD, [facility] [phone]

## 2023-06-03 ENCOUNTER — Ambulatory Visit (INDEPENDENT_AMBULATORY_CARE_PROVIDER_SITE_OTHER): Payer: Medicare (Managed Care) | Admitting: Family Medicine

## 2023-06-03 ENCOUNTER — Encounter: Payer: Self-pay | Admitting: Family Medicine

## 2023-06-03 VITALS — BP 122/68 | HR 86 | Temp 97.6°F | Resp 18 | Ht 67.0 in | Wt 269.7 lb

## 2023-06-03 DIAGNOSIS — Z13820 Encounter for screening for osteoporosis: Secondary | ICD-10-CM

## 2023-06-03 DIAGNOSIS — Z23 Encounter for immunization: Secondary | ICD-10-CM | POA: Diagnosis not present

## 2023-06-03 DIAGNOSIS — Z720 Tobacco use: Secondary | ICD-10-CM | POA: Diagnosis not present

## 2023-06-03 DIAGNOSIS — Z0000 Encounter for general adult medical examination without abnormal findings: Secondary | ICD-10-CM

## 2023-06-03 NOTE — Telephone Encounter (Signed)
PAP: Application for Markus Daft has been submitted to PAP Companies: [employer], via fax   PLEASE BE ADVISED

## 2023-06-04 ENCOUNTER — Other Ambulatory Visit: Payer: Self-pay | Admitting: Family Medicine

## 2023-06-04 MED ORDER — ALBUTEROL SULFATE HFA 108 (90 BASE) MCG/ACT IN AERS: 2.0000 | INHALATION_SPRAY | Freq: Four times a day (QID) | RESPIRATORY_TRACT | 0 refills | Status: DC | PRN

## 2023-06-06 NOTE — Patient Instructions (Signed)
Goals Addressed             This Visit's Progress    Pharmacy Goals       Please watch the mail for an envelope from [facility] containing the patient assistance program application. Please complete this application and bring to office to have it faxed back to Attention: Linward Foster at Fax # [phone] along with a copy of your Medicare Part D prescription card and a copy of your proof of income document.  If you need to call Joni Reining, you can reach her at [phone].  Check your blood pressure twice weekly, and any time you have concerning symptoms like headache, chest pain, dizziness, shortness of breath, or vision changes.   Our goal is less than 130/80.  To appropriately check your blood pressure, make sure you do the following:  1) Avoid caffeine, exercise, or tobacco products for 30 minutes before checking. Empty your bladder. 2) Sit with your back supported in a flat-backed chair. Rest your arm on something flat (arm of the chair, table, etc). 3) Sit still with your feet flat on the floor, resting, for at least 5 minutes.  4) Check your blood pressure. Take 1-2 readings.  5) Write down these readings and bring with you to any provider appointments.  Bring your home blood pressure machine with you to a provider's office for accuracy comparison at least once a year.   Make sure you take your blood pressure medications before you come to any office visit, even if you were asked to fast for labs.  Estelle Grumbles, PharmD, [facility] [phone]

## 2023-06-07 ENCOUNTER — Telehealth: Payer: Self-pay

## 2023-06-07 NOTE — Telephone Encounter (Signed)
PAP: Patient assistance application for Charlene Hardy has been approved by PAP Companies: [employer] from 06/07/2023 to 08/09/2024. Medication should be delivered to PAP Delivery: Home For further shipping updates, please contact [employer] ([employer]) at [phone] Pt ID is: [id number]

## 2023-06-07 NOTE — Telephone Encounter (Signed)
ERROR

## 2023-06-14 ENCOUNTER — Ambulatory Visit
Admission: RE | Admit: 2023-06-14 | Discharge: 2023-06-14 | Disposition: A | Discharge status: Home / Self Care | Payer: Medicare (Managed Care) | Source: Ambulatory Visit | Attending: Family Medicine | Admitting: Family Medicine

## 2023-06-14 DIAGNOSIS — Z720 Tobacco use: Secondary | ICD-10-CM | POA: Insufficient documentation

## 2023-06-14 DIAGNOSIS — Z0000 Encounter for general adult medical examination without abnormal findings: Secondary | ICD-10-CM | POA: Diagnosis not present

## 2023-06-14 DIAGNOSIS — Z122 Encounter for screening for malignant neoplasm of respiratory organs: Secondary | ICD-10-CM | POA: Insufficient documentation

## 2023-06-14 DIAGNOSIS — F17210 Nicotine dependence, cigarettes, uncomplicated: Secondary | ICD-10-CM | POA: Insufficient documentation

## 2023-06-15 ENCOUNTER — Ambulatory Visit (INDEPENDENT_AMBULATORY_CARE_PROVIDER_SITE_OTHER): Payer: Medicare (Managed Care) | Admitting: Nurse Practitioner

## 2023-06-15 ENCOUNTER — Encounter (INDEPENDENT_AMBULATORY_CARE_PROVIDER_SITE_OTHER): Payer: Self-pay | Admitting: Nurse Practitioner

## 2023-06-15 VITALS — BP 157/78 | HR 72 | Resp 18 | Ht 67.0 in | Wt 267.0 lb

## 2023-06-15 DIAGNOSIS — E785 Hyperlipidemia, unspecified: Secondary | ICD-10-CM | POA: Diagnosis not present

## 2023-06-15 DIAGNOSIS — I10 Essential (primary) hypertension: Secondary | ICD-10-CM

## 2023-06-15 DIAGNOSIS — I6523 Occlusion and stenosis of bilateral carotid arteries: Secondary | ICD-10-CM

## 2023-06-15 NOTE — Patient Instructions (Signed)
You will need to continue Plavix and Aspirin through your surgery and after

## 2023-06-16 ENCOUNTER — Encounter (INDEPENDENT_AMBULATORY_CARE_PROVIDER_SITE_OTHER): Payer: Self-pay | Admitting: Nurse Practitioner

## 2023-06-16 NOTE — Progress Notes (Signed)
Subjective:    Patient ID: Charlene Hardy, female    DOB: 28-Mar-1956, 67 y.o.   MRN: [id number] Chief Complaint  Patient presents with   Follow-up    CT results    The patient is seen for follow up evaluation of carotid stenosis status post CT angiogram. CT scan was done 06/02/2023.  Patient reports that the test went well with no problems or complications.   The patient denies interval amaurosis fugax. There is no recent or interval TIA symptoms or focal motor deficits. There is no prior documented CVA.  The patient is taking enteric-coated aspirin 81 mg daily as well as Plavix  There is no history of migraine headaches. There is no history of seizures.  No recent shortening of the patient's walking distance or new symptoms consistent with claudication.  No history of rest pain symptoms. No new ulcers or wounds of the lower extremities have occurred.  There is no history of DVT, PE or superficial thrombophlebitis. No recent episodes of angina or shortness of breath documented.   CT angiogram is reviewed by me personally and shows >80% stenosis consistent with calcified plaque at the origin of the right internal carotid artery.     Review of Systems  All other systems reviewed and are negative.      Objective:   Physical Exam Vitals reviewed.  HENT:     Head: Normocephalic.  Neck:     Vascular: No carotid bruit.  Cardiovascular:     Rate and Rhythm: Normal rate.     Pulses: Normal pulses.  Pulmonary:     Effort: Pulmonary effort is normal.  Musculoskeletal:     Right lower leg: Edema present.     Left lower leg: Edema present.  Skin:    General: Skin is warm and dry.  Neurological:     Mental Status: She is alert and oriented to person, place, and time.  Psychiatric:        Mood and Affect: Mood normal.        Behavior: Behavior normal.        Thought Content: Thought content normal.        Judgment: Judgment normal.     BP (!) 157/78 (BP Location: Right  Wrist)   Pulse 72   Resp 18   Ht 5\' 7"  (1.702 m)   Wt 267 lb (121.1 kg)   BMI 41.82 kg/m   Past Medical History:  Diagnosis Date   Arthritis    Diabetes mellitus without complication (HCC)    Heart murmur    Hyperlipidemia    Hypertension    Thyroid disease     Social History   Socioeconomic History   Marital status: Single    Spouse name: Not on file   Number of children: 0   Years of education: Not on file   Highest education level: High school graduate  Occupational History   Not on file  Tobacco Use   Smoking status: Every Day    Current packs/day: 1.00    Average packs/day: 1 pack/day for 46.8 years (46.8 ttl pk-yrs)    Types: Cigarettes    Start date: 08/10/1976   Smokeless tobacco: Never   Tobacco comments:    cutting down, smoking half pack lately   Vaping Use   Vaping status: Never Used  Substance and Sexual Activity   Alcohol use: No    Alcohol/week: 0.0 standard drinks of alcohol   Drug use: No   Sexual activity: Not Currently  Other Topics Concern   Not on file  Social History Narrative   Not on file   Social Determinants of Health   Financial Resource Strain: Low Risk  (06/03/2023)   Overall Financial Resource Strain (CARDIA)    Difficulty of Paying Living Expenses: Not hard at all  Food Insecurity: No Food Insecurity (06/03/2023)   Hunger Vital Sign    Worried About Running Out of Food in the Last Year: Never true    Ran Out of Food in the Last Year: Never true  Transportation Needs: No Transportation Needs (06/03/2023)   PRAPARE - Administrator, Civil Service (Medical): No    Lack of Transportation (Non-Medical): No  Physical Activity: Inactive (06/03/2023)   Exercise Vital Sign    Days of Exercise per Week: 0 days    Minutes of Exercise per Session: 0 min  Stress: No Stress Concern Present (06/03/2023)   [employer] of Occupational Health - Occupational Stress Questionnaire    Feeling of Stress : Only a little   Social Connections: Moderately Isolated (06/03/2023)   Social Connection and Isolation Panel [NHANES]    Frequency of Communication with Friends and Family: More than three times a week    Frequency of Social Gatherings with Friends and Family: Once a week    Attends Religious Services: More than 4 times per year    Active Member of [employer] or Organizations: No    Attends Banker Meetings: Never    Marital Status: Never married  Intimate Partner Violence: Not At Risk (06/03/2023)   Humiliation, Afraid, Rape, and Kick questionnaire    Fear of Current or Ex-Partner: No    Emotionally Abused: No    Physically Abused: No    Sexually Abused: No    Past Surgical History:  Procedure Laterality Date   ABDOMINAL HYSTERECTOMY  08/11/1999   still has ovaries   BREAST BIOPSY Right 09/05/2018   Affirm Bx- Ribbon clip- path pending   COLONOSCOPY  08/11/2015   COLONOSCOPY WITH PROPOFOL N/A 06/10/2016   Procedure: COLONOSCOPY WITH PROPOFOL;  Surgeon: Kieth Brightly, MD;  Location: [facility] END[facility];  Service: Endoscopy;  Laterality: N/A;   COLONOSCOPY WITH PROPOFOL N/A 06/11/2021   Procedure: COLONOSCOPY WITH PROPOFOL;  Surgeon: Earline Mayotte, MD;  Location: [facility];  Service: Endoscopy;  Laterality: N/A;    Family History  Problem Relation Age of Onset   Multiple sclerosis Mother    Diabetes Father    Breast cancer Sister 30   Other Sister        DDD - she had to have back surgery   Hypertension Maternal Grandmother    Thyroid disease Paternal Uncle    Breast cancer Paternal Aunt    Heart Problems Paternal Aunt     Allergies  Allergen Reactions   Hydrochlorothiazide     hyperparathyroidism   Pneumococcal Vaccine Rash   Pneumovax [Pneumococcal Polysaccharide Vaccine]        Latest Ref Rng & Units 09/03/2022   12:00 AM 11/04/2021   10:36 AM 10/28/2021    3:27 PM  CBC  WBC  9.0     9.8  12.4   Hemoglobin 12.0 - 16.0 13.4     12.8  12.6   Hematocrit 36 -  46 41     40.4  38.9   Platelets 150 - 400 K/uL 229     302  275      This result is from an external source.  CMP     Component Value Date/Time   NA 139 09/03/2022 0000   NA 133 (L) 08/18/2014 1941   K 4.7 09/03/2022 0000   K 3.9 08/18/2014 1941   CL 103 09/03/2022 0000   CL 100 08/18/2014 1941   CO2 31 (A) 09/03/2022 0000   CO2 28 08/18/2014 1941   GLUCOSE 85 10/21/2021 0957   GLUCOSE 109 (H) 08/18/2014 1941   BUN 10 09/03/2022 0000   BUN 9 08/18/2014 1941   CREATININE 0.80 06/02/2023 1540   CREATININE 0.69 10/21/2021 0957   CALCIUM 10.4 10/21/2022 0846   CALCIUM 9.7 08/18/2014 1941   PROT 6.9 10/21/2021 0957   PROT 6.8 04/25/2015 0857   PROT 7.2 08/18/2014 1941   ALBUMIN 4.0 09/03/2022 0000   ALBUMIN 4.0 04/25/2015 0857   ALBUMIN 3.5 08/18/2014 1941   AST 10 (A) 09/03/2022 0000   AST 11 (L) 08/18/2014 1941   ALT 7 09/03/2022 0000   ALT 18 08/18/2014 1941   ALKPHOS 112 09/03/2022 0000   ALKPHOS 105 08/18/2014 1941   BILITOT 0.5 10/21/2021 0957   BILITOT 0.3 04/25/2015 0857   BILITOT 0.4 08/18/2014 1941   EGFR 79 09/03/2022 0000   GFRNONAA 77 03/22/2020 1159     No results found.     Assessment & Plan:   1. Bilateral carotid artery stenosis Recommend:  The patient is symptomatic with respect to the carotid stenosis.  The patient now has progressed and has a lesion the is >70%.  I have completed Shared Decision-Making with Charlene Hardy to carotid surgery/intervention. The conversation included: -Discussion of all treatment options including carotid endarterectomy (CEA), carotid artery stenting (which includes transcarotid artery revascularization (TCAR)), and optimal medical therapy (OMT). -Explanation of risks and benefits for each option specific to [facility] clinical situation. -Integration of clinical guidelines as it relates to the patient's history and comorbidities. -Discussion and incorporation of Charlene Hardy and their  personal preferences and priorities in choosing a treatment plan plan  If the patient was unable to participate in Shared Decision Making this process was done with Charlene Hardy.  Patient's CT angiography of the carotid arteries confirms >70% right ICA stenosis.  The anatomical considerations support stenting over surgery.  This was discussed in detail with the patient.  The risks, benefits and alternative therapies were reviewed in detail with the patient.  All questions were answered.  The patient agrees to proceed with stenting of the right carotid artery.  The patient's NIHSS score is as follows: 0 Mild: 1 - 5 Mild to Moderately Severe: 5 - 14 Severe: 15 - 24 Very Severe: >25  Continue antiplatelet therapy as prescribed. Continue management of CAD, HTN and Hyperlipidemia. Healthy heart diet, encouraged exercise at least 4 times per week.   2. Essential (primary) hypertension Continue antihypertensive medications as already ordered, these medications have been reviewed and there are no changes at this time.  3. Dyslipidemia Continue statin as ordered and reviewed, no changes at this time   Current Outpatient Medications on File Prior to Visit  Medication Sig Dispense Refill   acetaminophen (TYLENOL) 500 MG tablet Take 1 tablet (500 mg total) by mouth 2 (two) times daily. 30 tablet 0   albuterol (VENTOLIN HFA) 108 (90 Base) MCG/ACT inhaler Inhale 2 puffs into the lungs every 6 (six) hours as needed for wheezing or shortness of breath. 8 g 0   amLODipine-benazepril (LOTREL) 5-20 MG capsule Take 1 capsule by mouth daily. 90 capsule 1   aspirin 81  MG tablet Take 1 tablet by mouth daily.     atorvastatin (LIPITOR) 40 MG tablet Take 1 tablet (40 mg total) by mouth daily. 90 tablet 1   Cholecalciferol (VITAMIN D) 2000 UNITS tablet Take 1 tablet by mouth daily.     clopidogrel (PLAVIX) 75 MG tablet Take 1 tablet (75 mg total) by mouth daily. 30 tablet 6   ferrous sulfate 325 (65 FE) MG  tablet Take 1 tablet by mouth daily.     furosemide (LASIX) 20 MG tablet Take 1 tablet (20 mg total) by mouth daily as needed. 90 tablet 1   gabapentin (NEURONTIN) 300 MG capsule Take 1 capsule (300 mg total) by mouth 3 (three) times daily. 270 capsule 1   levothyroxine (SYNTHROID) 137 MCG tablet Take 137 mcg by mouth every morning.     metoprolol succinate (TOPROL-XL) 50 MG 24 hr tablet Take 1 tablet (50 mg total) by mouth daily. Take with or immediately following a meal. 90 tablet 1   MULTIPLE VITAMINS-MINERALS ER PO Take 1 tablet by mouth daily.     naftifine (NAFTIN) 1 % cream APPLY  CREAM TOPICALLY ONCE DAILY 90 g 0   omega-3 fish oil (MAXEPA) 1000 MG CAPS capsule Take 1 capsule by mouth daily.     Polyethylene Glycol 3350 POWD Take 1 Dose by mouth as needed.     potassium chloride SA (KLOR-CON M) 20 MEQ tablet Take 1 tablet (20 mEq total) by mouth daily. 90 tablet 1   triamcinolone cream (KENALOG) 0.1 % Apply 1 application topically 2 (two) times daily. Mix with Eucerin or Nivea lotion ( at home  ) 453.6 g 0   Budeson-Glycopyrrol-Formoterol (BREZTRI AEROSPHERE) 160-9-4.8 MCG/ACT AERO Inhale 2 puffs into the lungs 2 (two) times daily. (Patient not taking: Reported on 06/15/2023) 32.1 g 1   No current facility-administered medications on file prior to visit.    Patient Instructions  You will need to continue Plavix and Aspirin through your surgery and after  No follow-ups on file.   Georgiana Spinner, NP

## 2023-06-16 NOTE — H&P (View-Only) (Signed)
 Subjective:    Patient ID: Charlene Hardy, female    DOB: 28-Mar-1956, 67 y.o.   MRN: [id number] Chief Complaint  Patient presents with   Follow-up    CT results    The patient is seen for follow up evaluation of carotid stenosis status post CT angiogram. CT scan was done 06/02/2023.  Patient reports that the test went well with no problems or complications.   The patient denies interval amaurosis fugax. There is no recent or interval TIA symptoms or focal motor deficits. There is no prior documented CVA.  The patient is taking enteric-coated aspirin 81 mg daily as well as Plavix  There is no history of migraine headaches. There is no history of seizures.  No recent shortening of the patient's walking distance or new symptoms consistent with claudication.  No history of rest pain symptoms. No new ulcers or wounds of the lower extremities have occurred.  There is no history of DVT, PE or superficial thrombophlebitis. No recent episodes of angina or shortness of breath documented.   CT angiogram is reviewed by me personally and shows >80% stenosis consistent with calcified plaque at the origin of the right internal carotid artery.     Review of Systems  All other systems reviewed and are negative.      Objective:   Physical Exam Vitals reviewed.  HENT:     Head: Normocephalic.  Neck:     Vascular: No carotid bruit.  Cardiovascular:     Rate and Rhythm: Normal rate.     Pulses: Normal pulses.  Pulmonary:     Effort: Pulmonary effort is normal.  Musculoskeletal:     Right lower leg: Edema present.     Left lower leg: Edema present.  Skin:    General: Skin is warm and dry.  Neurological:     Mental Status: She is alert and oriented to person, place, and time.  Psychiatric:        Mood and Affect: Mood normal.        Behavior: Behavior normal.        Thought Content: Thought content normal.        Judgment: Judgment normal.     BP (!) 157/78 (BP Location: Right  Wrist)   Pulse 72   Resp 18   Ht 5\' 7"  (1.702 m)   Wt 267 lb (121.1 kg)   BMI 41.82 kg/m   Past Medical History:  Diagnosis Date   Arthritis    Diabetes mellitus without complication (HCC)    Heart murmur    Hyperlipidemia    Hypertension    Thyroid disease     Social History   Socioeconomic History   Marital status: Single    Spouse name: Not on file   Number of children: 0   Years of education: Not on file   Highest education level: High school graduate  Occupational History   Not on file  Tobacco Use   Smoking status: Every Day    Current packs/day: 1.00    Average packs/day: 1 pack/day for 46.8 years (46.8 ttl pk-yrs)    Types: Cigarettes    Start date: 08/10/1976   Smokeless tobacco: Never   Tobacco comments:    cutting down, smoking half pack lately   Vaping Use   Vaping status: Never Used  Substance and Sexual Activity   Alcohol use: No    Alcohol/week: 0.0 standard drinks of alcohol   Drug use: No   Sexual activity: Not Currently  Other Topics Concern   Not on file  Social History Narrative   Not on file   Social Determinants of Health   Financial Resource Strain: Low Risk  (06/03/2023)   Overall Financial Resource Strain (CARDIA)    Difficulty of Paying Living Expenses: Not hard at all  Food Insecurity: No Food Insecurity (06/03/2023)   Hunger Vital Sign    Worried About Running Out of Food in the Last Year: Never true    Ran Out of Food in the Last Year: Never true  Transportation Needs: No Transportation Needs (06/03/2023)   PRAPARE - Administrator, Civil Service (Medical): No    Lack of Transportation (Non-Medical): No  Physical Activity: Inactive (06/03/2023)   Exercise Vital Sign    Days of Exercise per Week: 0 days    Minutes of Exercise per Session: 0 min  Stress: No Stress Concern Present (06/03/2023)   [employer] of Occupational Health - Occupational Stress Questionnaire    Feeling of Stress : Only a little   Social Connections: Moderately Isolated (06/03/2023)   Social Connection and Isolation Panel [NHANES]    Frequency of Communication with Friends and Family: More than three times a week    Frequency of Social Gatherings with Friends and Family: Once a week    Attends Religious Services: More than 4 times per year    Active Member of [employer] or Organizations: No    Attends Banker Meetings: Never    Marital Status: Never married  Intimate Partner Violence: Not At Risk (06/03/2023)   Humiliation, Afraid, Rape, and Kick questionnaire    Fear of Current or Ex-Partner: No    Emotionally Abused: No    Physically Abused: No    Sexually Abused: No    Past Surgical History:  Procedure Laterality Date   ABDOMINAL HYSTERECTOMY  08/11/1999   still has ovaries   BREAST BIOPSY Right 09/05/2018   Affirm Bx- Ribbon clip- path pending   COLONOSCOPY  08/11/2015   COLONOSCOPY WITH PROPOFOL N/A 06/10/2016   Procedure: COLONOSCOPY WITH PROPOFOL;  Surgeon: Kieth Brightly, MD;  Location: [facility] ENDOSCOPY;  Service: Endoscopy;  Laterality: N/A;   COLONOSCOPY WITH PROPOFOL N/A 06/11/2021   Procedure: COLONOSCOPY WITH PROPOFOL;  Surgeon: Earline Mayotte, MD;  Location: [facility] ENDOSCOPY;  Service: Endoscopy;  Laterality: N/A;    Family History  Problem Relation Age of Onset   Multiple sclerosis Mother    Diabetes Father    Breast cancer Sister 30   Other Sister        DDD - she had to have back surgery   Hypertension Maternal Grandmother    Thyroid disease Paternal Uncle    Breast cancer Paternal Aunt    Heart Problems Paternal Aunt     Allergies  Allergen Reactions   Hydrochlorothiazide     hyperparathyroidism   Pneumococcal Vaccine Rash   Pneumovax [Pneumococcal Polysaccharide Vaccine]        Latest Ref Rng & Units 09/03/2022   12:00 AM 11/04/2021   10:36 AM 10/28/2021    3:27 PM  CBC  WBC  9.0     9.8  12.4   Hemoglobin 12.0 - 16.0 13.4     12.8  12.6   Hematocrit 36 -  46 41     40.4  38.9   Platelets 150 - 400 K/uL 229     302  275      This result is from an external source.  CMP     Component Value Date/Time   NA 139 09/03/2022 0000   NA 133 (L) 08/18/2014 1941   K 4.7 09/03/2022 0000   K 3.9 08/18/2014 1941   CL 103 09/03/2022 0000   CL 100 08/18/2014 1941   CO2 31 (A) 09/03/2022 0000   CO2 28 08/18/2014 1941   GLUCOSE 85 10/21/2021 0957   GLUCOSE 109 (H) 08/18/2014 1941   BUN 10 09/03/2022 0000   BUN 9 08/18/2014 1941   CREATININE 0.80 06/02/2023 1540   CREATININE 0.69 10/21/2021 0957   CALCIUM 10.4 10/21/2022 0846   CALCIUM 9.7 08/18/2014 1941   PROT 6.9 10/21/2021 0957   PROT 6.8 04/25/2015 0857   PROT 7.2 08/18/2014 1941   ALBUMIN 4.0 09/03/2022 0000   ALBUMIN 4.0 04/25/2015 0857   ALBUMIN 3.5 08/18/2014 1941   AST 10 (A) 09/03/2022 0000   AST 11 (L) 08/18/2014 1941   ALT 7 09/03/2022 0000   ALT 18 08/18/2014 1941   ALKPHOS 112 09/03/2022 0000   ALKPHOS 105 08/18/2014 1941   BILITOT 0.5 10/21/2021 0957   BILITOT 0.3 04/25/2015 0857   BILITOT 0.4 08/18/2014 1941   EGFR 79 09/03/2022 0000   GFRNONAA 77 03/22/2020 1159     No results found.     Assessment & Plan:   1. Bilateral carotid artery stenosis Recommend:  The patient is symptomatic with respect to the carotid stenosis.  The patient now has progressed and has a lesion the is >70%.  I have completed Shared Decision-Making with Teya Otterson to carotid surgery/intervention. The conversation included: -Discussion of all treatment options including carotid endarterectomy (CEA), carotid artery stenting (which includes transcarotid artery revascularization (TCAR)), and optimal medical therapy (OMT). -Explanation of risks and benefits for each option specific to [facility] clinical situation. -Integration of clinical guidelines as it relates to the patient's history and comorbidities. -Discussion and incorporation of Charlene Hardy and their  personal preferences and priorities in choosing a treatment plan plan  If the patient was unable to participate in Shared Decision Making this process was done with Charlene Hardy.  Patient's CT angiography of the carotid arteries confirms >70% right ICA stenosis.  The anatomical considerations support stenting over surgery.  This was discussed in detail with the patient.  The risks, benefits and alternative therapies were reviewed in detail with the patient.  All questions were answered.  The patient agrees to proceed with stenting of the right carotid artery.  The patient's NIHSS score is as follows: 0 Mild: 1 - 5 Mild to Moderately Severe: 5 - 14 Severe: 15 - 24 Very Severe: >25  Continue antiplatelet therapy as prescribed. Continue management of CAD, HTN and Hyperlipidemia. Healthy heart diet, encouraged exercise at least 4 times per week.   2. Essential (primary) hypertension Continue antihypertensive medications as already ordered, these medications have been reviewed and there are no changes at this time.  3. Dyslipidemia Continue statin as ordered and reviewed, no changes at this time   Current Outpatient Medications on File Prior to Visit  Medication Sig Dispense Refill   acetaminophen (TYLENOL) 500 MG tablet Take 1 tablet (500 mg total) by mouth 2 (two) times daily. 30 tablet 0   albuterol (VENTOLIN HFA) 108 (90 Base) MCG/ACT inhaler Inhale 2 puffs into the lungs every 6 (six) hours as needed for wheezing or shortness of breath. 8 g 0   amLODipine-benazepril (LOTREL) 5-20 MG capsule Take 1 capsule by mouth daily. 90 capsule 1   aspirin 81  MG tablet Take 1 tablet by mouth daily.     atorvastatin (LIPITOR) 40 MG tablet Take 1 tablet (40 mg total) by mouth daily. 90 tablet 1   Cholecalciferol (VITAMIN D) 2000 UNITS tablet Take 1 tablet by mouth daily.     clopidogrel (PLAVIX) 75 MG tablet Take 1 tablet (75 mg total) by mouth daily. 30 tablet 6   ferrous sulfate 325 (65 FE) MG  tablet Take 1 tablet by mouth daily.     furosemide (LASIX) 20 MG tablet Take 1 tablet (20 mg total) by mouth daily as needed. 90 tablet 1   gabapentin (NEURONTIN) 300 MG capsule Take 1 capsule (300 mg total) by mouth 3 (three) times daily. 270 capsule 1   levothyroxine (SYNTHROID) 137 MCG tablet Take 137 mcg by mouth every morning.     metoprolol succinate (TOPROL-XL) 50 MG 24 hr tablet Take 1 tablet (50 mg total) by mouth daily. Take with or immediately following a meal. 90 tablet 1   MULTIPLE VITAMINS-MINERALS ER PO Take 1 tablet by mouth daily.     naftifine (NAFTIN) 1 % cream APPLY  CREAM TOPICALLY ONCE DAILY 90 g 0   omega-3 fish oil (MAXEPA) 1000 MG CAPS capsule Take 1 capsule by mouth daily.     Polyethylene Glycol 3350 POWD Take 1 Dose by mouth as needed.     potassium chloride SA (KLOR-CON M) 20 MEQ tablet Take 1 tablet (20 mEq total) by mouth daily. 90 tablet 1   triamcinolone cream (KENALOG) 0.1 % Apply 1 application topically 2 (two) times daily. Mix with Eucerin or Nivea lotion ( at home  ) 453.6 g 0   Budeson-Glycopyrrol-Formoterol (BREZTRI AEROSPHERE) 160-9-4.8 MCG/ACT AERO Inhale 2 puffs into the lungs 2 (two) times daily. (Patient not taking: Reported on 06/15/2023) 32.1 g 1   No current facility-administered medications on file prior to visit.    Patient Instructions  You will need to continue Plavix and Aspirin through your surgery and after  No follow-ups on file.   Georgiana Spinner, NP

## 2023-06-28 ENCOUNTER — Telehealth (INDEPENDENT_AMBULATORY_CARE_PROVIDER_SITE_OTHER): Payer: Self-pay

## 2023-06-28 NOTE — Telephone Encounter (Signed)
I attempted to contact the patient to schedule her with Dr. Wyn Quaker for a right carotid stent placement at the [facility]. A message was left for a return call.

## 2023-06-29 NOTE — Telephone Encounter (Signed)
Patient was called again and no answer. I called the patient's sister who put her on the phone. The patient is scheduled with Dr. Wyn Quaker on 07/01/23 with a 11:15 am arrival time to the [facility] for a right carotid stent placement. Pre-procedure instructions were discussed and patient stated she understood and patient's sister was listening as well. Patient does not have Mychart.

## 2023-07-01 ENCOUNTER — Other Ambulatory Visit: Payer: Self-pay

## 2023-07-01 ENCOUNTER — Encounter: Payer: Self-pay | Admitting: Vascular Surgery

## 2023-07-01 ENCOUNTER — Encounter
Admission: RE | Disposition: A | Discharge status: Home / Self Care | Payer: Self-pay | Source: Home / Self Care | Attending: Vascular Surgery

## 2023-07-01 ENCOUNTER — Ambulatory Visit
Admission: RE | Admit: 2023-07-01 | Discharge: 2023-07-01 | Disposition: A | Discharge status: Home / Self Care | Payer: Medicare (Managed Care) | Attending: Vascular Surgery | Admitting: Vascular Surgery

## 2023-07-01 DIAGNOSIS — I6521 Occlusion and stenosis of right carotid artery: Secondary | ICD-10-CM

## 2023-07-01 DIAGNOSIS — Z7902 Long term (current) use of antithrombotics/antiplatelets: Secondary | ICD-10-CM | POA: Insufficient documentation

## 2023-07-01 DIAGNOSIS — I70201 Unspecified atherosclerosis of native arteries of extremities, right leg: Secondary | ICD-10-CM

## 2023-07-01 DIAGNOSIS — F17210 Nicotine dependence, cigarettes, uncomplicated: Secondary | ICD-10-CM | POA: Insufficient documentation

## 2023-07-01 DIAGNOSIS — I6523 Occlusion and stenosis of bilateral carotid arteries: Secondary | ICD-10-CM | POA: Diagnosis not present

## 2023-07-01 DIAGNOSIS — Z79899 Other long term (current) drug therapy: Secondary | ICD-10-CM | POA: Insufficient documentation

## 2023-07-01 DIAGNOSIS — I2510 Atherosclerotic heart disease of native coronary artery without angina pectoris: Secondary | ICD-10-CM | POA: Diagnosis not present

## 2023-07-01 DIAGNOSIS — Z7982 Long term (current) use of aspirin: Secondary | ICD-10-CM | POA: Diagnosis not present

## 2023-07-01 DIAGNOSIS — I10 Essential (primary) hypertension: Secondary | ICD-10-CM | POA: Diagnosis not present

## 2023-07-01 DIAGNOSIS — E785 Hyperlipidemia, unspecified: Secondary | ICD-10-CM | POA: Insufficient documentation

## 2023-07-01 HISTORY — PX: CAROTID PTA/STENT INTERVENTION: CATH118231

## 2023-07-01 LAB — CREATININE, SERUM
Creatinine, Ser: 0.81 mg/dL (ref 0.44–1.00)
GFR, Estimated: >60 mL/min (ref >60)

## 2023-07-01 LAB — BUN: BUN: 14 mg/dL (ref 8–23)

## 2023-07-01 LAB — GLUCOSE, CAPILLARY: Glucose-Capillary: 75 mg/dL (ref 70–99)

## 2023-07-01 SURGERY — CAROTID PTA/STENT INTERVENTION
Anesthesia: Moderate Sedation | Laterality: Right

## 2023-07-01 MED ORDER — MIDAZOLAM HCL 2 MG/ML PO SYRP: 8.0000 mg | ORAL_SOLUTION | Freq: Once | ORAL | Status: DC | PRN

## 2023-07-01 MED ORDER — SODIUM CHLORIDE 0.9 % IV SOLN: INTRAVENOUS | Status: DC

## 2023-07-01 MED ORDER — PHENYLEPHRINE HCL-NACL 20-0.9 MG/250ML-% IV SOLN
INTRAVENOUS | Status: AC
  Filled 2023-07-01: qty 250

## 2023-07-01 MED ORDER — IODIXANOL 320 MG/ML IV SOLN
INTRAVENOUS | Status: DC | PRN
  Administered 2023-07-01: 75 mL

## 2023-07-01 MED ORDER — CEFAZOLIN SODIUM-DEXTROSE 2-4 GM/100ML-% IV SOLN
INTRAVENOUS | Status: AC
  Filled 2023-07-01: qty 100

## 2023-07-01 MED ORDER — HEPARIN SODIUM (PORCINE) 1000 UNIT/ML IJ SOLN
INTRAMUSCULAR | Status: DC | PRN
  Administered 2023-07-01: 8000 [IU] via INTRAVENOUS

## 2023-07-01 MED ORDER — FENTANYL CITRATE (PF) 100 MCG/2ML IJ SOLN
INTRAMUSCULAR | Status: DC | PRN
  Administered 2023-07-01: 50 ug via INTRAVENOUS

## 2023-07-01 MED ORDER — CEFAZOLIN SODIUM-DEXTROSE 2-4 GM/100ML-% IV SOLN
2.0000 g | INTRAVENOUS | Status: AC
  Administered 2023-07-01: 2 g via INTRAVENOUS

## 2023-07-01 MED ORDER — HEPARIN SODIUM (PORCINE) 1000 UNIT/ML IJ SOLN
INTRAMUSCULAR | Status: AC
Start: 2023-07-01 — End: ?
  Filled 2023-07-01: qty 10

## 2023-07-01 MED ORDER — HEPARIN (PORCINE) IN NACL 2000-0.9 UNIT/L-% IV SOLN
INTRAVENOUS | Status: DC | PRN
  Administered 2023-07-01: 2000 mL

## 2023-07-01 MED ORDER — LIDOCAINE-EPINEPHRINE (PF) 1 %-1:200000 IJ SOLN
INTRAMUSCULAR | Status: DC | PRN
  Administered 2023-07-01: 10 mL

## 2023-07-01 MED ORDER — FAMOTIDINE 20 MG PO TABS: 40.0000 mg | ORAL_TABLET | Freq: Once | ORAL | Status: DC | PRN

## 2023-07-01 MED ORDER — MIDAZOLAM HCL 2 MG/2ML IJ SOLN
INTRAMUSCULAR | Status: AC
  Filled 2023-07-01: qty 4

## 2023-07-01 MED ORDER — ATROPINE SULFATE 1 MG/10ML IJ SOSY
PREFILLED_SYRINGE | INTRAMUSCULAR | Status: AC
  Filled 2023-07-01: qty 20

## 2023-07-01 MED ORDER — HYDROMORPHONE HCL 1 MG/ML IJ SOLN: 1.0000 mg | Freq: Once | INTRAMUSCULAR | Status: DC | PRN

## 2023-07-01 MED ORDER — MIDAZOLAM HCL 2 MG/2ML IJ SOLN
INTRAMUSCULAR | Status: DC | PRN
  Administered 2023-07-01: 2 mg via INTRAVENOUS

## 2023-07-01 MED ORDER — FENTANYL CITRATE (PF) 100 MCG/2ML IJ SOLN
INTRAMUSCULAR | Status: AC
  Filled 2023-07-01: qty 2

## 2023-07-01 MED ORDER — DOPAMINE-DEXTROSE 3.2-5 MG/ML-% IV SOLN
INTRAVENOUS | Status: AC
  Filled 2023-07-01: qty 250

## 2023-07-01 MED ORDER — DIPHENHYDRAMINE HCL 50 MG/ML IJ SOLN: 50.0000 mg | Freq: Once | INTRAMUSCULAR | Status: DC | PRN

## 2023-07-01 MED ORDER — ONDANSETRON HCL 4 MG/2ML IJ SOLN: 4.0000 mg | Freq: Four times a day (QID) | INTRAMUSCULAR | Status: DC | PRN

## 2023-07-01 MED ORDER — ACETAMINOPHEN 500 MG PO TABS
ORAL_TABLET | ORAL | Status: AC
  Filled 2023-07-01: qty 2

## 2023-07-01 MED ORDER — PHENYLEPHRINE 80 MCG/ML (10ML) SYRINGE FOR IV PUSH (FOR BLOOD PRESSURE SUPPORT)
PREFILLED_SYRINGE | INTRAVENOUS | Status: AC
  Filled 2023-07-01: qty 10

## 2023-07-01 MED ORDER — METHYLPREDNISOLONE SODIUM SUCC 125 MG IJ SOLR: 125.0000 mg | Freq: Once | INTRAMUSCULAR | Status: DC | PRN

## 2023-07-01 MED ORDER — ACETAMINOPHEN 500 MG PO TABS
1000.0000 mg | ORAL_TABLET | Freq: Four times a day (QID) | ORAL | Status: DC | PRN
  Administered 2023-07-01: 1000 mg via ORAL

## 2023-07-01 SURGICAL SUPPLY — 15 items
CATH ANGIO 5F PIGTAIL 100CM (CATHETERS) IMPLANT
CATH BEACON 5 .035 100 H1 TIP (CATHETERS) IMPLANT
CATH BEACON 5 .035 100 JB2 TIP (CATHETERS) IMPLANT
COVER DRAPE FLUORO 36X44 (DRAPES) IMPLANT
COVER PROBE ULTRASOUND 5X96 (MISCELLANEOUS) IMPLANT
DEVICE PRESTO INFLATION (MISCELLANEOUS) IMPLANT
DEVICE STARCLOSE SE CLOSURE (Vascular Products) IMPLANT
DEVICE TORQUE (MISCELLANEOUS) IMPLANT
GLIDEWIRE ANGLED SS 035X260CM (WIRE) IMPLANT
KIT CAROTID MANIFOLD (MISCELLANEOUS) IMPLANT
PACK ANGIOGRAPHY (CUSTOM PROCEDURE TRAY) ×1 IMPLANT
SHEATH BRITE TIP 6FRX11 (SHEATH) IMPLANT
SYR MEDRAD MARK 7 150ML (SYRINGE) IMPLANT
WIRE G VAS 035X260 STIFF (WIRE) IMPLANT
WIRE GUIDERIGHT .035X150 (WIRE) IMPLANT

## 2023-07-01 NOTE — Op Note (Signed)
[facility]   OPERATIVE NOTE  DATE: 07/01/2023  PRE-OPERATIVE DIAGNOSIS: 1. High grade right carotid artery stenosis   POST-OPERATIVE DIAGNOSIS: Right cervical carotid occlusion  PROCEDURE: 1.   Ultrasound Guidance for vascular access right femoral artery 2.   Catheter placement into right common carotid artery and into left common carotid artery from right femoral approach 3.   Thoracic aortogram 4.   Cervical and cerebral bilateral carotid angiograms 5.   StarClose closure device right femoral artery  SURGEON: Festus Barren, MD  ASSISTANT(S): None  ANESTHESIA: Moderate conscious sedation  ESTIMATED BLOOD LOSS: 10 cc  FLUORO TIME: 4.4 minutes  CONTRAST: 75 mL  MODERATE CONSCIOUS SEDATION TIME: Approximately 33 minutes using 2 mg of Versed and 50 mcg of Fentanyl  FINDING(S): 1.  Cervical right carotid artery was occluded with no intracranial flow through the right carotid system. Right vertebral artery was large and without stenosis Left cervical carotid artery had mild disease in the 20 to 25% range Intracranial flow showed complete cross-filling from left to right and both the anterior and middle cerebral arteries consistent with chronic occlusion  SPECIMEN(S):  None  INDICATIONS:   Patient is a 68 y.o.female who presents with a duplex showing low velocities in the carotid artery and a CT angiogram that suggested a very high-grade stenosis of the right internal carotid artery in the cervical portion. Catheter-based angiogram is performed for further evaluation. Risks and benefits are discussed and informed consent was obtained.  DESCRIPTION: After obtaining full informed written consent, the patient was brought back to the operating room and placed supine upon the vascular suite table.  After obtaining adequate anesthesia, the patient was prepped and draped in the standard fashion.  Moderate conscious sedation was administered during a face to face  encounter with the patient throughout the procedure with my supervision of the RN administering medicines and monitoring the patients vital signs and mental status throughout from the start of the procedure until the patient was taken to the recovery room. The right femoral artery was visualized with ultrasound and found to be calcific but patent. It was then accessed under direct ultrasound guidance without difficulty with a Seldinger needle. A J-wire and 6 French sheath were placed and a permanent image was recorded. The patient was given 8000 units of intravenous heparin. A pigtail catheter was placed into the ascending aorta and an LAO projection thoracic aortogram was performed. This showed fairly normal origins of the great vessels with a type II aortic arch.  I then selected a headhunter catheter and cannulated the innominate artery advancing into the mid right common carotid artery. Cervical and cerebral carotid angiography were then performed on the right side initially. The intracranial flow was found to be completely lacking with no intracranial flow seen from the right carotid system. The cervical carotid artery was occluded at the carotid bifurcation with no flow distally.  I pulled the headhunter catheter down to the innominate artery and performed an image which showed a very large right vertebral artery coming about 2 to 3 cm off of the subclavian artery without significant stenosis and brisk intracranial flow.  I then turned my attention back to the thoracic aorta and removed the catheter from the right side. I selectively cannulated the left common carotid artery without difficulty with a JB2 catheter and advanced into the mid left common carotid artery. Selective imaging was then performed of the cervical and cerebral carotid artery on the left. Intracranial filling was complete cross-filling  left to right including both the anterior and cerebral carotid arteries without any intracranial flow gaps  or deficits noted. The cervical carotid artery was minimally diseased with only about a 20 or 25% stenosis in the cervical portion. Multiple views were taken in the cervical carotid artery bilaterally. At this point, we had imaging to plan our treatment and we elected to terminate the procedure. The diagnostic catheter was removed. Oblique arteriogram was performed of the right femoral artery and StarClose closure device was deployed in usual fashion with excellent hemostatic result. The patient tolerated the procedure well and was taken to the recovery room in stable condition.  COMPLICATIONS: None  CONDITION: Stable   Festus Barren 07/01/2023 2:16 PM   This note was created with Dragon Medical transcription system. Any errors in dictation are purely unintentional.

## 2023-07-01 NOTE — Interval H&P Note (Signed)
History and Physical Interval Note:  07/01/2023 12:57 PM  Charlene Hardy  has presented today for surgery, with the diagnosis of R Carotid Stent   ABBOTT    R carotid artery stenosis.  The various methods of treatment have been discussed with the patient and family. After consideration of risks, benefits and other options for treatment, the patient has consented to  Procedure(s): CAROTID PTA/STENT INTERVENTION (Right) as a surgical intervention.  The patient's history has been reviewed, patient examined, no change in status, stable for surgery.  I have reviewed the patient's chart and labs.  Questions were answered to the patient's satisfaction.     Festus Barren

## 2023-07-02 ENCOUNTER — Encounter: Payer: Self-pay | Admitting: Vascular Surgery

## 2023-07-05 ENCOUNTER — Encounter: Payer: Self-pay | Admitting: Vascular Surgery

## 2023-07-05 ENCOUNTER — Encounter: Payer: Self-pay | Admitting: Pharmacist

## 2023-07-07 ENCOUNTER — Other Ambulatory Visit: Payer: Self-pay | Admitting: Pharmacist

## 2023-07-07 NOTE — Patient Instructions (Signed)
Goals Addressed             This Visit's Progress    Pharmacy Goals       Please watch the mail for an envelope from [facility] containing the patient assistance program application. Please complete this application and bring to office to have it faxed back to Attention: Linward Foster at Fax # [phone] along with a copy of your Medicare Part D prescription card and a copy of your proof of income document.  If you need to call Joni Reining, you can reach her at [phone].  Check your blood pressure twice weekly, and any time you have concerning symptoms like headache, chest pain, dizziness, shortness of breath, or vision changes.   Our goal is less than 130/80.  To appropriately check your blood pressure, make sure you do the following:  1) Avoid caffeine, exercise, or tobacco products for 30 minutes before checking. Empty your bladder. 2) Sit with your back supported in a flat-backed chair. Rest your arm on something flat (arm of the chair, table, etc). 3) Sit still with your feet flat on the floor, resting, for at least 5 minutes.  4) Check your blood pressure. Take 1-2 readings.  5) Write down these readings and bring with you to any provider appointments.  Bring your home blood pressure machine with you to a provider's office for accuracy comparison at least once a year.   Make sure you take your blood pressure medications before you come to any office visit, even if you were asked to fast for labs.  Estelle Grumbles, PharmD, [facility] [phone]

## 2023-07-07 NOTE — Progress Notes (Signed)
07/07/2023 Name: Patrycia Mountz MRN: [id number] DOB: 29-May-1956  Chief Complaint  Patient presents with   Medication Management   Medication Assistance    Fayette Monzingo is a 67 y.o. year old female who presented for a telephone visit.   They were referred to the pharmacist by their PCP for assistance in managing medication access.      Subjective:   Care Team: Primary Care Provider: Alba Cory, MD ; Next Scheduled Visit: 11/19/2023 Cardiologist: Debbe Odea, MD Endocrinologist: Erlene Quan, MD  Vein and Vascular Specialist: Georgiana Spinner, NP; Next Scheduled Visit: 10/01/2023  Medication Access/Adherence  Current Pharmacy:  [facility] [address]), [place] - [address] [place]) [place] [address] Phone: [phone] Fax: [phone]   Patient reports affordability concerns with their medications: Yes  Patient reports access/transportation concerns to their pharmacy: No  Patient reports adherence concerns with their medications:  No    Uses weekly pillbox to organize her medications   Reports that she is recovering well since procedure with vascular specialist on 11/21  Diabetes:   Current medications: Ozempic 0.25 mg weekly on Saturdays (completed 4 doses as of 11/23)  Reports tolerating well and plans to increase to Ozempic 0.5 mg weekly on 11/30 as planned   Medications tried in the past: Ozempic (cost)   Denies monitoring home blood sugar   Reports has made changes to improve her diet/limit portion sizes, including cutting back on soda   COPD:   Current medications:  - albuterol HFA inhaler - 2 puffs every 6 hours as needed for wheezing/shortness of breath - Breztri 160-9-4.8 mcg/act - 2 puffs into lungs twice daily   Reports tries to avoid triggers that worsen her breathing, such as cologne     Current medication access support: enrolled in patient assistance for Breztri from  [employer] through 08/09/2024     Hypertension:   Current medications:  - amlodipine-benazepril 5-20 mg daily - metoprolol ER 50 mg daily - furosemide 20 mg daily as needed              Reports currently using ~4 days/week   Patient does not have a validated, automated, upper arm home BP cuff; denies monitoring home blood pressure   Patient denies hypotensive s/sx including dizziness, lightheadedness.    Current physical activity: limited recently since retired a couple of months ago     Tobacco Abuse:   Reports currently smokes ~1.5 pack/day   Triggers: stress   Barriers: appetite suppression with smoking; concerned about gaining weight if stops   Reports has previously tried nicotine patches, but did not notice benefit   Denies interest in setting quit date at this time, but plans to work on cutting back     Objective:  Lab Results  Component Value Date   HGBA1C 6.1 (A) 04/23/2023    Lab Results  Component Value Date   CREATININE 0.81 07/01/2023   BUN 14 07/01/2023   NA 139 09/03/2022   K 4.7 09/03/2022   CL 103 09/03/2022   CO2 31 (A) 09/03/2022    Lab Results  Component Value Date   CHOL 143 09/03/2022   HDL 43 09/03/2022   LDLCALC 78 09/03/2022   TRIG 122 09/03/2022   CHOLHDL 3.0 10/21/2021   BP Readings from Last 3 Encounters:  07/01/23 (!) 132/99  06/15/23 (!) 157/78  06/03/23 122/68   Pulse Readings from Last 3 Encounters:  07/01/23 60  06/15/23 72  06/03/23  86     Medications Reviewed Today     Reviewed by Manuela Neptune, RPH-CPP (Pharmacist) on 07/07/23 at 1704  Med List Status: <None>   Medication Order Taking? Sig Documenting Provider Last Dose Status Informant  acetaminophen (TYLENOL) 500 MG tablet [id number]  Take 1 tablet (500 mg total) by mouth 2 (two) times daily. Alba Cory, MD  Active   albuterol (VENTOLIN HFA) 108 (90 Base) MCG/ACT inhaler [id number]  Inhale 2 puffs into the lungs every 6 (six) hours as needed for  wheezing or shortness of breath. Alba Cory, MD  Active   amLODipine-benazepril (LOTREL) 5-20 MG capsule [id number]  Take 1 capsule by mouth daily. Alba Cory, MD  Active   aspirin 81 MG tablet [id number]  Take 1 tablet by mouth daily. [provider]  Active            Med Note Laurell Josephs, [facility]   Fri Apr 18, 2021  8:46 AM)    atorvastatin (LIPITOR) 40 MG tablet [id number]  Take 1 tablet (40 mg total) by mouth daily. Alba Cory, MD  Active   Budeson-Glycopyrrol-Formoterol (BREZTRI AEROSPHERE) 160-9-4.8 MCG/ACT Sandrea Matte [id number]  Inhale 2 puffs into the lungs 2 (two) times daily. Alba Cory, MD  Active   Cholecalciferol (VITAMIN D) 2000 UNITS tablet [id number]  Take 1 tablet by mouth daily. [provider]  Active            Med Note Laurell Josephs, [facility]   Fri Apr 18, 2021  8:46 AM)    clopidogrel (PLAVIX) 75 MG tablet [id number]  Take 1 tablet (75 mg total) by mouth daily. Georgiana Spinner, NP  Active   ferrous sulfate 325 (65 FE) MG tablet [id number]  Take 1 tablet by mouth daily. [provider]  Active            Med Note Laurell Josephs, [facility]   Fri Apr 18, 2021  8:46 AM)    furosemide (LASIX) 20 MG tablet [id number]  Take 1 tablet (20 mg total) by mouth daily as needed. Alba Cory, MD  Active   gabapentin (NEURONTIN) 300 MG capsule [id number]  Take 1 capsule (300 mg total) by mouth 3 (three) times daily. Alba Cory, MD  Active   levothyroxine (SYNTHROID) 137 MCG tablet [id number]  Take 137 mcg by mouth every morning. [provider]  Active   metoprolol succinate (TOPROL-XL) 50 MG 24 hr tablet [id number]  Take 1 tablet (50 mg total) by mouth daily. Take with or immediately following a meal. Alba Cory, MD  Active   MULTIPLE VITAMINS-MINERALS ER PO [id number]  Take 1 tablet by mouth daily. [provider]  Active            Med Note Dollene Primrose   Fri Apr 18, 2021  8:46 AM)    naftifine (NAFTIN) 1 % cream [id number]  APPLY  CREAM TOPICALLY  ONCE DAILY Alba Cory, MD  Active            Med Note Angelica Ran Jun 03, 2023 11:44 AM) Needs refill  omega-3 fish oil (MAXEPA) 1000 MG CAPS capsule [id number]  Take 1 capsule by mouth daily. [provider]  Active            Med Note Laurell Josephs, [facility]   Fri Apr 18, 2021  8:46 AM)    Polyethylene Glycol 3350 POWD [id number]  Take 1 Dose by mouth as needed. [provider]  Active  Med Note Dollene Primrose   Fri Apr 18, 2021  8:46 AM)    potassium chloride SA (KLOR-CON M) 20 MEQ tablet [id number]  Take 1 tablet (20 mEq total) by mouth daily. Alba Cory, MD  Active   Semaglutide,0.25 or 0.5MG /DOS, (OZEMPIC, 0.25 OR 0.5 MG/DOSE,) 2 MG/3ML SOPN [id number] Yes Inject 0.25 mg into the skin once a week. [provider] Taking Active   triamcinolone cream (KENALOG) 0.1 % [id number]  Apply 1 application topically 2 (two) times daily. Mix with Eucerin or Nivea lotion ( at home  ) Alba Cory, MD  Active            Med Note Cherly Beach, Heide Spark Jun 03, 2023 11:44 AM) Needs refill              Assessment/Plan:   Diabetes: - Currently controlled - Counsel patient on strategies to aid with tolerability of Ozempic - Collaborating with provider, CPhT, and patient to pursue assistance from [employer] for 2025 calendar year for Ozempic 1 mg weekly strength.  COPD: - Reviewed appropriate inhaler technique. - Remind patient to contact [employer] program as needed for refills of Breztri inhaler    Hypertension: - Have reviewed long term cardiovascular and renal outcomes of uncontrolled blood pressure - Have reviewed appropriate blood pressure monitoring technique and reviewed goal blood pressure.  - Recommended to consider obtaining an upper arm blood pressure monitor through her Part D plan OTC benefit, check home blood pressure and heart rate, keep log of results and have this record to review at upcoming medical appointments. Patient to  contact provider office sooner if needed for readings outside of established parameters or symptoms   Tobacco Abuse - Provided motivational interviewing to assess tobacco use and strategies for reduction        Follow Up Plan: Clinical Pharmacist will follow up with patient by telephone on 08/02/2023 at 8:30 AM    Estelle Grumbles, PharmD, [facility] [phone]

## 2023-07-23 ENCOUNTER — Other Ambulatory Visit: Payer: Self-pay | Admitting: Pharmacist

## 2023-07-23 NOTE — Progress Notes (Signed)
   07/23/2023  Patient ID: Charlene Hardy, female   DOB: 12/18/1955, 67 y.o.   MRN: [id number]  Receive a call from patient advising that she received the Novo [employer] patient assistance application, but has questions about filling this out. - Address patient's questions regarding application and remind patient to have faxed back to CPhT Linward Foster along with a copy of her proof of income document and copy of insurance cards  Patient states that she will bring completed application and these additional documents back to office today to have faxed back to CPhT. - Will send message to CPhT to provide this update.  Estelle Grumbles, PharmD, [facility] [phone]

## 2023-07-29 ENCOUNTER — Telehealth: Payer: Self-pay

## 2023-07-29 NOTE — Telephone Encounter (Signed)
RECEIVED PT PAGES AND INCOME AND PROVIDER PAGES NOT SIGN I WILL FAXING TO PROVIDER OFFICE OF Alba Cory MD. FOR REVIEW   PLEASE BE ADVISED

## 2023-08-02 ENCOUNTER — Encounter: Payer: Self-pay | Admitting: Family Medicine

## 2023-08-02 ENCOUNTER — Other Ambulatory Visit: Payer: Self-pay | Admitting: Pharmacist

## 2023-08-02 DIAGNOSIS — E1140 Type 2 diabetes mellitus with diabetic neuropathy, unspecified: Secondary | ICD-10-CM

## 2023-08-02 MED ORDER — ONETOUCH VERIO FLEX SYSTEM W/DEVICE KIT: PACK | 0 refills | Status: AC

## 2023-08-02 MED ORDER — ONETOUCH DELICA PLUS LANCET30G MISC: 3 refills | Status: DC

## 2023-08-02 MED ORDER — ONETOUCH DELICA PLUS LANCET30G MISC: 3 refills | Status: AC

## 2023-08-02 MED ORDER — ONETOUCH VERIO VI STRP: ORAL_STRIP | 3 refills | Status: AC

## 2023-08-02 MED ORDER — ONETOUCH VERIO VI STRP: ORAL_STRIP | 3 refills | Status: DC

## 2023-08-02 NOTE — Progress Notes (Signed)
08/02/2023 Name: Charlene Hardy MRN: [id number] DOB: [birth year]  Chief Complaint  Patient presents with   Medication Management   Medication Assistance    Charlene Hardy is a 67 y.o. year old female who presented for a telephone visit.   They were referred to the pharmacist by their PCP for assistance in managing medication access.      Subjective:   Care Team: Primary Care Provider: Alba Cory, MD ; Next Scheduled Visit: 11/19/2023 Cardiologist: Debbe Odea, MD Endocrinologist: Erlene Quan, MD  Vein and Vascular Specialist: Georgiana Spinner, NP; Next Scheduled Visit: 10/01/2023  Medication Access/Adherence  Current Pharmacy:  [facility] [address] ([address]), [place] - [address] [place]) [place] [address] Phone: [phone] Fax: [phone]   Patient reports affordability concerns with their medications: Yes  Patient reports access/transportation concerns to their pharmacy: No  Patient reports adherence concerns with their medications:  No     Uses weekly pillbox to organize her medications      Diabetes:   Current medications: Ozempic 0.5 mg weekly on Saturdays (increased to current dose on 11/30)  Reports tolerating well   Medications tried in the past: Ozempic (cost)   Denies monitoring home blood sugar  Statin therapy: atorvastatin 40 mg daily   Current medication access support: Collaborating with provider, CPhT, and patient to pursue assistance from [employer] for 2025 calendar year for Ozempic 1 mg weekly strength   COPD:   Current medications:  - albuterol HFA inhaler - 2 puffs every 6 hours as needed for wheezing/shortness of breath - Breztri 160-9-4.8 mcg/act - 2 puffs into lungs twice daily   Reports tries to avoid triggers that worsen her breathing, such as cologne     Current medication access support: enrolled in patient assistance for Breztri from [employer] through  08/09/2024     Hypertension:   Current medications:  - amlodipine-benazepril 5-20 mg daily - metoprolol ER 50 mg daily - furosemide 20 mg daily as needed   Patient does not have a validated, automated, upper arm home BP cuff; denies monitoring home blood pressure. Plans to obtain BP monitor through health plan OTC benefit   Patient denies hypotensive s/sx including dizziness, lightheadedness.    Current physical activity: limited recently since retired a couple of months ago     Tobacco Abuse:   Reports currently smokes ~1.5 pack/day   Triggers: stress   Barriers: appetite suppression with smoking; concerned about gaining weight if stops   Reports has previously tried nicotine patches, but did not notice benefit   Denies interest in setting quit date at this time, but plans to work on cutting back     Objective:  Lab Results  Component Value Date   HGBA1C 6.1 (A) 04/23/2023    Lab Results  Component Value Date   CREATININE 0.81 07/01/2023   BUN 14 07/01/2023   NA 139 09/03/2022   K 4.7 09/03/2022   CL 103 09/03/2022   CO2 31 (A) 09/03/2022    Lab Results  Component Value Date   CHOL 143 09/03/2022   HDL 43 09/03/2022   LDLCALC 78 09/03/2022   TRIG 122 09/03/2022   CHOLHDL 3.0 10/21/2021    Medications Reviewed Today     Reviewed by Manuela Neptune, RPH-CPP (Pharmacist) on 08/02/23 at [phone]  Med List Status: <None>   Medication Order Taking? Sig Documenting Provider Last Dose Status Informant  acetaminophen (TYLENOL) 500 MG tablet [id number]  Take 1  tablet (500 mg total) by mouth 2 (two) times daily. Alba Cory, MD  Active   albuterol (VENTOLIN HFA) 108 (90 Base) MCG/ACT inhaler [id number]  Inhale 2 puffs into the lungs every 6 (six) hours as needed for wheezing or shortness of breath. Alba Cory, MD  Active   amLODipine-benazepril (LOTREL) 5-20 MG capsule [id number] Yes Take 1 capsule by mouth daily. Alba Cory, MD Taking Active    aspirin 81 MG tablet [id number]  Take 1 tablet by mouth daily. [provider]  Active            Med Note Laurell Josephs, [facility]   Fri Apr 18, 2021  8:46 AM)    atorvastatin (LIPITOR) 40 MG tablet [id number]  Take 1 tablet (40 mg total) by mouth daily. Alba Cory, MD  Active   Budeson-Glycopyrrol-Formoterol (BREZTRI AEROSPHERE) 160-9-4.8 MCG/ACT Sandrea Matte [id number] Yes Inhale 2 puffs into the lungs 2 (two) times daily. Alba Cory, MD Taking Active   Cholecalciferol (VITAMIN D) 2000 UNITS tablet [id number]  Take 1 tablet by mouth daily. [provider]  Active            Med Note Laurell Josephs, [facility]   Fri Apr 18, 2021  8:46 AM)    clopidogrel (PLAVIX) 75 MG tablet [id number]  Take 1 tablet (75 mg total) by mouth daily. Georgiana Spinner, NP  Active   ferrous sulfate 325 (65 FE) MG tablet [id number]  Take 1 tablet by mouth daily. [provider]  Active            Med Note Laurell Josephs, [facility]   Fri Apr 18, 2021  8:46 AM)    furosemide (LASIX) 20 MG tablet [id number] Yes Take 1 tablet (20 mg total) by mouth daily as needed. Alba Cory, MD Taking Active   gabapentin (NEURONTIN) 300 MG capsule [id number]  Take 1 capsule (300 mg total) by mouth 3 (three) times daily. Alba Cory, MD  Active   levothyroxine (SYNTHROID) 137 MCG tablet [id number] Yes Take 137 mcg by mouth every morning. [provider] Taking Active   metoprolol succinate (TOPROL-XL) 50 MG 24 hr tablet [id number] Yes Take 1 tablet (50 mg total) by mouth daily. Take with or immediately following a meal. Alba Cory, MD Taking Active   MULTIPLE VITAMINS-MINERALS ER PO [id number]  Take 1 tablet by mouth daily. [provider]  Active            Med Note Dollene Primrose   Fri Apr 18, 2021  8:46 AM)    naftifine (NAFTIN) 1 % cream [id number]  APPLY  CREAM TOPICALLY ONCE DAILY Alba Cory, MD  Active            Med Note Angelica Ran Jun 03, 2023 11:44 AM) Needs refill  omega-3 fish oil (MAXEPA)  1000 MG CAPS capsule [id number]  Take 1 capsule by mouth daily. [provider]  Active            Med Note Laurell Josephs, [facility]   Fri Apr 18, 2021  8:46 AM)    Polyethylene Glycol 3350 POWD [id number]  Take 1 Dose by mouth as needed. [provider]  Active            Med Note Laurell Josephs, [facility]   Fri Apr 18, 2021  8:46 AM)    potassium chloride SA (KLOR-CON M) 20 MEQ tablet [id number]  Take 1 tablet (20 mEq total) by mouth daily. Alba Cory, MD  Active   Semaglutide,0.25 or 0.5MG /DOS, (OZEMPIC,  0.25 OR 0.5 MG/DOSE,) 2 MG/3ML SOPN [id number]  Inject 0.5 mg into the skin once a week. [provider]  Active   triamcinolone cream (KENALOG) 0.1 % [id number]  Apply 1 application topically 2 (two) times daily. Mix with Eucerin or Nivea lotion ( at home  ) Alba Cory, MD  Active            Med Note Cherly Beach, Heide Spark Jun 03, 2023 11:44 AM) Needs refill              Assessment/Plan:   Diabetes: - Currently controlled - Have counseled patient on strategies to aid with tolerability of Ozempic - Will collaborate with PCP to send prescriptions for diabetes testing supplies to pharmacy for patient as requested  From review of [employer] Medicare website, appears OneTouch products preferred for her plan - Collaborating with provider, CPhT, and patient to pursue assistance from [employer] for 2025 calendar year for Ozempic 1 mg weekly strength.   COPD: - Reviewed appropriate inhaler technique. - Remind patient to contact AZ[employer]Me program as needed for refills of Breztri inhaler     Hypertension: - Have reviewed long term cardiovascular and renal outcomes of uncontrolled blood pressure - Have reviewed appropriate blood pressure monitoring technique and reviewed goal blood pressure.  - Recommended to consider obtaining an upper arm blood pressure monitor through her Part D plan OTC benefit, check home blood pressure and heart rate, keep log of results and have this record  to review at upcoming medical appointments. Patient to contact provider office sooner if needed for readings outside of established parameters or symptoms   Tobacco Abuse - Have provided motivational interviewing to assess tobacco use and strategies for reduction         Follow Up Plan: Clinical Pharmacist will follow up with patient by telephone on 09/15/2023 at 2:30 PM    Estelle Grumbles, PharmD, [facility] [phone]

## 2023-08-02 NOTE — Telephone Encounter (Signed)
I RECEIVED PROVIDER PAGES PAP: Application for Ozempic has been submitted to PAP Companies: [employer], via fax    PLEASE BE ADVISED

## 2023-08-02 NOTE — Patient Instructions (Signed)
Goals Addressed             This Visit's Progress    Pharmacy Goals       If you need to call Joni Reining, you can reach her at [phone].  Check your blood pressure twice weekly, and any time you have concerning symptoms like headache, chest pain, dizziness, shortness of breath, or vision changes.   Our goal is less than 130/80.  To appropriately check your blood pressure, make sure you do the following:  1) Avoid caffeine, exercise, or tobacco products for 30 minutes before checking. Empty your bladder. 2) Sit with your back supported in a flat-backed chair. Rest your arm on something flat (arm of the chair, table, etc). 3) Sit still with your feet flat on the floor, resting, for at least 5 minutes.  4) Check your blood pressure. Take 1-2 readings.  5) Write down these readings and bring with you to any provider appointments.  Bring your home blood pressure machine with you to a provider's office for accuracy comparison at least once a year.   Make sure you take your blood pressure medications before you come to any office visit, even if you were asked to fast for labs.   Estelle Grumbles, PharmD, [facility] [phone]

## 2023-08-02 NOTE — Addendum Note (Signed)
Addended by: Estelle Grumbles A on: 08/02/2023 01:30 PM   Modules accepted: Orders

## 2023-08-17 NOTE — Telephone Encounter (Signed)
 PLEASE BE ADVISED: Received notification in regards to missing information on pt application have fix it and resubmitted to company.   PAP: Application for Ozempic has been submitted to PAP Companies: [employer], via fax

## 2023-08-26 ENCOUNTER — Other Ambulatory Visit: Payer: Self-pay | Admitting: Family Medicine

## 2023-08-26 ENCOUNTER — Telehealth: Payer: Self-pay | Admitting: Family Medicine

## 2023-08-26 NOTE — Telephone Encounter (Signed)
Pt aware.

## 2023-08-26 NOTE — Telephone Encounter (Signed)
Called the patient she is asking Dr Carlynn Purl to send in a rx for her Ozempic shot. She said that Dr Carlynn Purl gave her a sample of the medication and she just needs to be able to go and get the medication. Also that was what the paper she dropped off was for . To be able to get her assistance with the cost which she said would be  zero for her but would have to be filled out by her Dr Carlynn Purl. But Sowles said that she would have to get filled out with Endo. Please give pt a call to advise any further.

## 2023-08-31 NOTE — Telephone Encounter (Signed)
PAP: Patient assistance application Ozempic for has been approved by PAP Companies: [employer] from 08/22/2023 to 08/21/2024. Medication should be delivered to PAP Delivery: Provider's office For further shipping updates, please contact [employer] at [phone] Pt ID is: [id number]

## 2023-09-02 MED ORDER — SEMAGLUTIDE (1 MG/DOSE) 4 MG/3ML ~~LOC~~ SOPN: 1.0000 mg | PEN_INJECTOR | SUBCUTANEOUS | 0 refills | Status: AC

## 2023-09-15 ENCOUNTER — Other Ambulatory Visit: Payer: Self-pay | Admitting: Pharmacist

## 2023-09-15 DIAGNOSIS — J410 Simple chronic bronchitis: Secondary | ICD-10-CM

## 2023-09-15 DIAGNOSIS — E1140 Type 2 diabetes mellitus with diabetic neuropathy, unspecified: Secondary | ICD-10-CM

## 2023-09-15 DIAGNOSIS — I10 Essential (primary) hypertension: Secondary | ICD-10-CM

## 2023-09-15 NOTE — Progress Notes (Signed)
 09/15/2023 Name: Charlene Hardy MRN: [id number] DOB: August 04, 1956  Chief Complaint  Patient presents with   Medication Management   Medication Assistance    Charlene Hardy is a 68 y.o. year old female who presented for a telephone visit.   They were referred to the pharmacist by their PCP for assistance in managing medication access.      Subjective:   Care Team: Primary Care Provider: Sowles, Krichna, MD ; Next Scheduled Visit: 11/19/2023 Cardiologist: Darliss Rogue, MD Endocrinologist: Cherilyn Debby Quivers, MD  Vein and Vascular Specialist: Delores Orvin BRAVO, NP; Next Scheduled Visit: 10/01/2023    Medication Access/Adherence  Current Pharmacy:  [facility] [address] ([address]), [place] - [address] [place] ([place]) [place] [address] Phone: [phone] Fax: [phone]   Patient reports affordability concerns with their medications: Yes  Patient reports access/transportation concerns to their pharmacy: No  Patient reports adherence concerns with their medications:  No     Uses weekly pillbox to organize her medications      Diabetes:   Current medications: Ozempic  0.5 mg weekly on Saturdays  Reports plans to pick up 1 month supply of Ozempic  1 mg weekly from pharmacy (as called in by her PCP) to start and then will continue with supply from assistance program (once received)   Medications tried in the past: Ozempic  (cost)   Denies monitoring home blood sugar   Reports Ozempic  has lost   Statin therapy: atorvastatin  40 mg daily   Current medication access support: Enrolled in patient assistance from [employer] for Ozempic  1 mg weekly strength from 08/22/2023 to 08/21/2024 per note from CPhT   COPD:   Current medications:  - albuterol  HFA inhaler - 2 puffs every 6 hours as needed for wheezing/shortness of breath - Breztri  160-9-4.8 mcg/act - 2 puffs into lungs twice daily   Reports tries to avoid triggers that  worsen her breathing, such as cologne     Current medication access support: enrolled in patient assistance for Breztri  from [employer] through 08/09/2024     Hypertension:   Current medications:  - amlodipine -benazepril  5-20 mg daily - metoprolol  ER 50 mg daily - furosemide  20 mg daily as needed   Patient does not have a validated, automated, upper arm home BP cuff; denies monitoring home blood pressure. Plans to obtain BP monitor through health plan OTC benefit   Patient denies hypotensive s/sx including dizziness, lightheadedness.    Current physical activity: limited recently since retired a couple of months ago     Tobacco Abuse:   Reports has reduced to currently smoking ~1 pack/day   Triggers: stress   Barriers: appetite suppression with smoking; concerned about gaining weight if stops   Reports has previously tried nicotine  patches, but did not notice benefit   Denies interest in setting quit date at this time, but plans to continue to work on cutting back       Objective:  Lab Results  Component Value Date   HGBA1C 6.1 (A) 04/23/2023    Lab Results  Component Value Date   CREATININE 0.81 07/01/2023   BUN 14 07/01/2023   NA 139 09/03/2022   K 4.7 09/03/2022   CL 103 09/03/2022   CO2 31 (A) 09/03/2022    Lab Results  Component Value Date   CHOL 143 09/03/2022   HDL 43 09/03/2022   LDLCALC 78 09/03/2022   TRIG 122 09/03/2022   CHOLHDL 3.0 10/21/2021   BP Readings from Last 3 Encounters:  07/01/23 (!) 132/99  06/15/23 (!) 157/78  06/03/23 122/68   Pulse Readings from Last 3 Encounters:  07/01/23 60  06/15/23 72  06/03/23 86    Medications Reviewed Today     Reviewed by Alana Sharyle LABOR, RPH-CPP (Pharmacist) on 09/15/23 at 1750  Med List Status: <None>   Medication Order Taking? Sig Documenting Provider Last Dose Status Informant  acetaminophen  (TYLENOL ) 500 MG tablet [id number] No Take 1 tablet (500 mg total) by mouth 2 (two) times daily.  Sowles, Krichna, MD 07/01/2023 Active   albuterol  (VENTOLIN  HFA) 108 (90 Base) MCG/ACT inhaler [id number] No Inhale 2 puffs into the lungs every 6 (six) hours as needed for wheezing or shortness of breath. Sowles, Krichna, MD 06/30/2023 Active   amLODipine -benazepril  (LOTREL) 5-20 MG capsule [id number] No Take 1 capsule by mouth daily. Sowles, Krichna, MD Taking Active   aspirin 81 MG tablet [id number] No Take 1 tablet by mouth daily. [provider] 07/01/2023 Active            Med Note NORVEL, [facility]   Fri Apr 18, 2021  8:46 AM)    atorvastatin  (LIPITOR) 40 MG tablet [id number] No Take 1 tablet (40 mg total) by mouth daily. Sowles, Krichna, MD 07/01/2023 Active   Blood Glucose Monitoring Suppl (ONETOUCH VERIO FLEX SYSTEM) w/Device KIT [id number]  Use to check blood sugar up to twice daily as directed Sowles, Krichna, MD  Active   Budeson-Glycopyrrol-Formoterol (BREZTRI  AEROSPHERE) 160-9-4.8 MCG/ACT AERO [id number] No Inhale 2 puffs into the lungs 2 (two) times daily. Sowles, Krichna, MD Taking Active   Cholecalciferol (VITAMIN D ) 2000 UNITS tablet [id number] No Take 1 tablet by mouth daily. [provider] 07/01/2023 Active            Med Note NORVEL, [facility]   Fri Apr 18, 2021  8:46 AM)    clopidogrel  (PLAVIX ) 75 MG tablet [id number] No Take 1 tablet (75 mg total) by mouth daily. Brown, Fallon E, NP 07/01/2023 Active   ferrous sulfate 325 (65 FE) MG tablet [id number] No Take 1 tablet by mouth daily. [provider] 07/01/2023 Active            Med Note NORVEL, [facility]   Fri Apr 18, 2021  8:46 AM)    furosemide  (LASIX ) 20 MG tablet [id number] No Take 1 tablet (20 mg total) by mouth daily as needed. Sowles, Krichna, MD Taking Active   gabapentin  (NEURONTIN ) 300 MG capsule [id number] No Take 1 capsule (300 mg total) by mouth 3 (three) times daily. Sowles, Krichna, MD 07/01/2023 Active   glucose blood (ONETOUCH VERIO) test strip [id number]  Use to check blood sugar up to twice daily  Sowles, Krichna, MD  Active   Lancets [facility] CATHRYNE PLUS [place]) MISC [id number]  Use to check blood sugar up to twice daily Sowles, Krichna, MD  Active   levothyroxine  (SYNTHROID ) 137 MCG tablet [id number] No Take 137 mcg by mouth every morning. [provider] Taking Active   metoprolol  succinate (TOPROL -XL) 50 MG 24 hr tablet [id number] No Take 1 tablet (50 mg total) by mouth daily. Take with or immediately following a meal. Sowles, Krichna, MD Taking Active   MULTIPLE VITAMINS-MINERALS ER PO [id number] No Take 1 tablet by mouth daily. [provider] 07/01/2023 Active            Med Note NORVEL, [facility]   Fri Apr 18, 2021  8:46 AM)    naftifine  (NAFTIN ) 1 % cream [id number] No APPLY  CREAM TOPICALLY ONCE DAILY Sowles, Krichna,  MD Taking Active            Med Note SALEM NENA LOISE [place] Jun 03, 2023 11:44 AM) Needs refill  omega-3 fish oil (MAXEPA) 1000 MG CAPS capsule [id number] No Take 1 capsule by mouth daily. [provider] 07/01/2023 Active            Med Note NORVEL, [facility]   Fri Apr 18, 2021  8:46 AM)    Polyethylene Glycol 3350  POWD [id number] No Take 1 Dose by mouth as needed. [provider] Past Week Active            Med Note NORVEL KIRSCH   Fri Apr 18, 2021  8:46 AM)    potassium chloride  SA (KLOR-CON  M) 20 MEQ tablet [id number] No Take 1 tablet (20 mEq total) by mouth daily. Sowles, Krichna, MD 07/01/2023 Active   Semaglutide , 1 MG/DOSE, 4 MG/3ML SOPN [id number]  Inject 1 mg as directed once a week. Sowles, Krichna, MD  Active   triamcinolone  cream (KENALOG ) 0.1 % [id number] No Apply 1 application topically 2 (two) times daily. Mix with Eucerin or Nivea lotion ( at home  ) Sowles, Krichna, MD Taking Active            Med Note SALEM, NENA LOISE [place] Jun 03, 2023 11:44 AM) Needs refill              Assessment/Plan:   Diabetes: - Currently controlled - Have counseled patient on strategies to aid with tolerability of Ozempic  - Patient  to follow up with [employer] patient assistance as needed for refills of Ozempic    COPD: - Reviewed appropriate inhaler technique. - Remind patient to contact [employer]Me program as needed for refills of Breztri  inhaler     Hypertension: - Have reviewed long term cardiovascular and renal outcomes of uncontrolled blood pressure - Have reviewed appropriate blood pressure monitoring technique and reviewed goal blood pressure.  - Recommended to consider obtaining an upper arm blood pressure monitor through her Part D plan OTC benefit, check home blood pressure and heart rate, keep log of results and have this record to review at upcoming medical appointments. Patient to contact provider office sooner if needed for readings outside of established parameters or symptoms   Tobacco Abuse - Have provided motivational interviewing to assess tobacco use and strategies for reduction       Follow Up Plan: Clinical Pharmacist will follow up with patient by telephone on 10/20/2023 at 3:00 PM    Sharyle Sia, PharmD, [facility] [phone]

## 2023-09-15 NOTE — Patient Instructions (Signed)
 Goals Addressed             This Visit's Progress    Pharmacy Goals       If you need to reach out to patient assistance programs regarding refills or to find out the status of your application, you can do so by calling:  [employer] at [phone] [employer] at [phone]  Check your blood pressure twice weekly, and any time you have concerning symptoms like headache, chest pain, dizziness, shortness of breath, or vision changes.   Our goal is less than 130/80.  To appropriately check your blood pressure, make sure you do the following:  1) Avoid caffeine, exercise, or tobacco products for 30 minutes before checking. Empty your bladder. 2) Sit with your back supported in a flat-backed chair. Rest your arm on something flat (arm of the chair, table, etc). 3) Sit still with your feet flat on the floor, resting, for at least 5 minutes.  4) Check your blood pressure. Take 1-2 readings.  5) Write down these readings and bring with you to any provider appointments.  Bring your home blood pressure machine with you to a provider's office for accuracy comparison at least once a year.   Make sure you take your blood pressure medications before you come to any office visit, even if you were asked to fast for labs.  Estelle Grumbles, PharmD, [facility] [phone]

## 2023-09-27 ENCOUNTER — Other Ambulatory Visit (INDEPENDENT_AMBULATORY_CARE_PROVIDER_SITE_OTHER): Payer: Self-pay | Admitting: Vascular Surgery

## 2023-09-27 DIAGNOSIS — I6521 Occlusion and stenosis of right carotid artery: Secondary | ICD-10-CM

## 2023-10-01 ENCOUNTER — Encounter (INDEPENDENT_AMBULATORY_CARE_PROVIDER_SITE_OTHER): Payer: Self-pay | Admitting: Nurse Practitioner

## 2023-10-01 ENCOUNTER — Ambulatory Visit (INDEPENDENT_AMBULATORY_CARE_PROVIDER_SITE_OTHER): Payer: Medicare (Managed Care)

## 2023-10-01 ENCOUNTER — Ambulatory Visit (INDEPENDENT_AMBULATORY_CARE_PROVIDER_SITE_OTHER): Payer: Medicare (Managed Care) | Admitting: Nurse Practitioner

## 2023-10-01 VITALS — BP 136/82 | HR 66 | Resp 18 | Ht 67.0 in | Wt 255.6 lb

## 2023-10-01 DIAGNOSIS — F17210 Nicotine dependence, cigarettes, uncomplicated: Secondary | ICD-10-CM

## 2023-10-01 DIAGNOSIS — I6521 Occlusion and stenosis of right carotid artery: Secondary | ICD-10-CM | POA: Diagnosis not present

## 2023-10-01 DIAGNOSIS — I10 Essential (primary) hypertension: Secondary | ICD-10-CM | POA: Diagnosis not present

## 2023-10-01 DIAGNOSIS — E785 Hyperlipidemia, unspecified: Secondary | ICD-10-CM | POA: Diagnosis not present

## 2023-10-01 DIAGNOSIS — I6523 Occlusion and stenosis of bilateral carotid arteries: Secondary | ICD-10-CM | POA: Diagnosis not present

## 2023-10-01 NOTE — Progress Notes (Signed)
Subjective:    Patient ID: Charlene Hardy, female    DOB: 1955/12/25, 68 y.o.   MRN: [id number] Chief Complaint  Patient presents with   Follow-up     3 months (09/29/2023); with carotid duplex    The patient returns today following a carotid angiogram on 07/01/2023.  This was done after carotid duplex showed that she had flow within her internal carotid artery and CTA suggested high-grade stenosis.  Angiogram ultimately revealed that she had an occlusion in the cervical carotid portion.  Her left ICA was fairly patent and responsible for cross-filling left to right.  In addition the patient has a very large right vertebral artery showing brisk intracranial flow.  She currently denies any TIA-like symptoms.  No CVA-like symptoms.  There is no amaurosis fugax.  Today noninvasive studies continue to show low velocity flow in the right ICA.  This is consistent with the stenosis noted on angiogram.  Her left shows a 40 to 59% stenosis.  She had antegrade flow in the bilateral vertebrals and normal flow hemodynamics in the bilateral subclavian arteries.    Review of Systems  Psychiatric/Behavioral:  Positive for agitation.   All other systems reviewed and are negative.      Objective:   Physical Exam Vitals reviewed.  HENT:     Head: Normocephalic.  Neck:     Vascular: No carotid bruit.  Cardiovascular:     Rate and Rhythm: Normal rate.  Pulmonary:     Effort: Pulmonary effort is normal.  Skin:    General: Skin is warm and dry.  Neurological:     Mental Status: She is alert and oriented to person, place, and time.  Psychiatric:        Mood and Affect: Mood normal.        Behavior: Behavior normal.        Thought Content: Thought content normal.        Judgment: Judgment normal.     BP 136/82   Pulse 66   Resp 18   Ht 5\' 7"  (1.702 m)   Wt 255 lb 9.6 oz (115.9 kg)   BMI 40.03 kg/m   Past Medical History:  Diagnosis Date   Arthritis    Diabetes mellitus without  complication (HCC)    Heart murmur    Hyperlipidemia    Hypertension    Thyroid disease     Social History   Socioeconomic History   Marital status: Single    Spouse name: Not on file   Number of children: 0   Years of education: Not on file   Highest education level: High school graduate  Occupational History   Not on file  Tobacco Use   Smoking status: Every Day    Current packs/day: 1.00    Average packs/day: 1 pack/day for 47.1 years (47.1 ttl pk-yrs)    Types: Cigarettes    Start date: 08/10/1976   Smokeless tobacco: Never   Tobacco comments:    cutting down, smoking half pack lately   Vaping Use   Vaping status: Never Used  Substance and Sexual Activity   Alcohol use: No    Alcohol/week: 0.0 standard drinks of alcohol   Drug use: No   Sexual activity: Not Currently  Other Topics Concern   Not on file  Social History Narrative   Not on file   Social Drivers of Health   Financial Resource Strain: Low Risk  (06/03/2023)   Overall Financial Resource Strain (CARDIA)  Difficulty of Paying Living Expenses: Not hard at all  Food Insecurity: No Food Insecurity (06/03/2023)   Hunger Vital Sign    Worried About Running Out of Food in the Last Year: Never true    Ran Out of Food in the Last Year: Never true  Transportation Needs: No Transportation Needs (06/03/2023)   PRAPARE - Administrator, Civil Service (Medical): No    Lack of Transportation (Non-Medical): No  Physical Activity: Inactive (06/03/2023)   Exercise Vital Sign    Days of Exercise per Week: 0 days    Minutes of Exercise per Session: 0 min  Stress: No Stress Concern Present (06/03/2023)   [employer] of Occupational Health - Occupational Stress Questionnaire    Feeling of Stress : Only a little  Social Connections: Moderately Isolated (06/03/2023)   Social Connection and Isolation Panel [NHANES]    Frequency of Communication with Friends and Family: More than three times a week     Frequency of Social Gatherings with Friends and Family: Once a week    Attends Religious Services: More than 4 times per year    Active Member of [employer] or Organizations: No    Attends Banker Meetings: Never    Marital Status: Never married  Intimate Partner Violence: Not At Risk (06/03/2023)   Humiliation, Afraid, Rape, and Kick questionnaire    Fear of Current or Ex-Partner: No    Emotionally Abused: No    Physically Abused: No    Sexually Abused: No    Past Surgical History:  Procedure Laterality Date   ABDOMINAL HYSTERECTOMY  08/11/1999   still has ovaries   BREAST BIOPSY Right 09/05/2018   Affirm Bx- Ribbon clip- path pending   CAROTID PTA/STENT INTERVENTION Right 07/01/2023   Procedure: CAROTID PTA/STENT INTERVENTION;  Surgeon: Annice Needy, MD;  Location: [facility] IN[facility]ASIVE [facility];  Service: Cardiovascular;  Laterality: Right;   COLONOSCOPY  08/11/2015   COLONOSCOPY WITH PROPOFOL N/A 06/10/2016   Procedure: COLONOSCOPY WITH PROPOFOL;  Surgeon: Kieth Brightly, MD;  Location: [facility] ENDOSCOPY;  Service: Endoscopy;  Laterality: N/A;   COLONOSCOPY WITH PROPOFOL N/A 06/11/2021   Procedure: COLONOSCOPY WITH PROPOFOL;  Surgeon: Earline Mayotte, MD;  Location: [facility] ENDOSCOPY;  Service: Endoscopy;  Laterality: N/A;    Family History  Problem Relation Age of Onset   Multiple sclerosis Mother    Diabetes Father    Breast cancer Sister 73   Other Sister        DDD - she had to have back surgery   Hypertension Maternal Grandmother    Thyroid disease Paternal Uncle    Breast cancer Paternal Aunt    Heart Problems Paternal Aunt     Allergies  Allergen Reactions   Hydrochlorothiazide     hyperparathyroidism   Pneumococcal Vaccine Rash   Pneumovax [Pneumococcal Polysaccharide Vaccine]        Latest Ref Rng & Units 09/03/2022   12:00 AM 11/04/2021   10:36 AM 10/28/2021    3:27 PM  CBC  WBC  9.0     9.8  12.4   Hemoglobin 12.0 - 16.0 13.4     12.8  12.6    Hematocrit 36 - 46 41     40.4  38.9   Platelets 150 - 400 K/uL 229     302  275      This result is from an external source.      CMP     Component Value Date/Time  NA 139 09/03/2022 0000   NA 133 (L) 08/18/2014 1941   K 4.7 09/03/2022 0000   K 3.9 08/18/2014 1941   CL 103 09/03/2022 0000   CL 100 08/18/2014 1941   CO2 31 (A) 09/03/2022 0000   CO2 28 08/18/2014 1941   GLUCOSE 85 10/21/2021 0957   GLUCOSE 109 (H) 08/18/2014 1941   BUN 14 07/01/2023 1147   BUN 10 09/03/2022 0000   BUN 9 08/18/2014 1941   CREATININE 0.81 07/01/2023 1147   CREATININE 0.69 10/21/2021 0957   CALCIUM 10.4 10/21/2022 0846   CALCIUM 9.7 08/18/2014 1941   PROT 6.9 10/21/2021 0957   PROT 6.8 04/25/2015 0857   PROT 7.2 08/18/2014 1941   ALBUMIN 4.0 09/03/2022 0000   ALBUMIN 4.0 04/25/2015 0857   ALBUMIN 3.5 08/18/2014 1941   AST 10 (A) 09/03/2022 0000   AST 11 (L) 08/18/2014 1941   ALT 7 09/03/2022 0000   ALT 18 08/18/2014 1941   ALKPHOS 112 09/03/2022 0000   ALKPHOS 105 08/18/2014 1941   BILITOT 0.5 10/21/2021 0957   BILITOT 0.3 04/25/2015 0857   BILITOT 0.4 08/18/2014 1941   EGFR 79 09/03/2022 0000   GFRNONAA >60 07/01/2023 1147   GFRNONAA 77 03/22/2020 1159     No results found.     Assessment & Plan:   1. Bilateral carotid artery stenosis (Primary) All patient's studies today indicate that there may be a low flow within the right ICA, previous angiogram does note that she is occluded at her cervical portion.  Based on this no further intervention is required.  Patient should remain on medical therapy including use of Plavix aspirin and statin.  Will have the patient return in 6 months with noninvasive studies or earlier if issues arise.  2. Essential (primary) hypertension Continue antihypertensive medications as already ordered, these medications have been reviewed and there are no changes at this time.  3. Dyslipidemia Continue statin as ordered and reviewed, no changes at  this time    4. Cigarette smoker Nicole Kindred has a discussion with the patient regarding her smoking.  I discussed that this will likely accelerate the worsening of her carotid disease on the left.  Patient was unwilling to stop at this time.    Current Outpatient Medications on File Prior to Visit  Medication Sig Dispense Refill   acetaminophen (TYLENOL) 500 MG tablet Take 1 tablet (500 mg total) by mouth 2 (two) times daily. 30 tablet 0   albuterol (VENTOLIN HFA) 108 (90 Base) MCG/ACT inhaler Inhale 2 puffs into the lungs every 6 (six) hours as needed for wheezing or shortness of breath. 8 g 0   amLODipine-benazepril (LOTREL) 5-20 MG capsule Take 1 capsule by mouth daily. 90 capsule 1   aspirin 81 MG tablet Take 1 tablet by mouth daily.     atorvastatin (LIPITOR) 40 MG tablet Take 1 tablet (40 mg total) by mouth daily. 90 tablet 1   Blood Glucose Monitoring Suppl (ONETOUCH VERIO FLEX SYSTEM) w/Device KIT Use to check blood sugar up to twice daily as directed 1 kit 0   Budeson-Glycopyrrol-Formoterol (BREZTRI AEROSPHERE) 160-9-4.8 MCG/ACT AERO Inhale 2 puffs into the lungs 2 (two) times daily. 32.1 g 1   Cholecalciferol (VITAMIN D) 2000 UNITS tablet Take 1 tablet by mouth daily.     clopidogrel (PLAVIX) 75 MG tablet Take 1 tablet (75 mg total) by mouth daily. 30 tablet 6   ferrous sulfate 325 (65 FE) MG tablet Take 1 tablet by mouth daily.  furosemide (LASIX) 20 MG tablet Take 1 tablet (20 mg total) by mouth daily as needed. 90 tablet 1   gabapentin (NEURONTIN) 300 MG capsule Take 1 capsule (300 mg total) by mouth 3 (three) times daily. 270 capsule 1   glucose blood (ONETOUCH VERIO) test strip Use to check blood sugar up to twice daily 200 each 3   Lancets (ONETOUCH DELICA PLUS LANCET30G) MISC Use to check blood sugar up to twice daily 200 each 3   levothyroxine (SYNTHROID) 137 MCG tablet Take 137 mcg by mouth every morning.     metoprolol succinate (TOPROL-XL) 50 MG 24 hr tablet Take 1 tablet  (50 mg total) by mouth daily. Take with or immediately following a meal. 90 tablet 1   MULTIPLE VITAMINS-MINERALS ER PO Take 1 tablet by mouth daily.     naftifine (NAFTIN) 1 % cream APPLY  CREAM TOPICALLY ONCE DAILY 90 g 0   omega-3 fish oil (MAXEPA) 1000 MG CAPS capsule Take 1 capsule by mouth daily.     Polyethylene Glycol 3350 POWD Take 1 Dose by mouth as needed.     potassium chloride SA (KLOR-CON M) 20 MEQ tablet Take 1 tablet (20 mEq total) by mouth daily. 90 tablet 1   Semaglutide, 1 MG/DOSE, 4 MG/3ML SOPN Inject 1 mg as directed once a week. 9 mL 0   triamcinolone cream (KENALOG) 0.1 % Apply 1 application topically 2 (two) times daily. Mix with Eucerin or Nivea lotion ( at home  ) 453.6 g 0   No current facility-administered medications on file prior to visit.    There are no Patient Instructions on file for this visit. No follow-ups on file.   Georgiana Spinner, NP

## 2023-10-05 ENCOUNTER — Telehealth: Payer: Self-pay

## 2023-10-05 NOTE — Telephone Encounter (Signed)
 [employer] SHIPMENT FOR BREZTRI HAS BEEN SHIP ON 10/04/2023  PLEASE BE ADVISED WILL BE SHIP TO PT

## 2023-10-15 ENCOUNTER — Ambulatory Visit
Admission: RE | Admit: 2023-10-15 | Discharge: 2023-10-15 | Disposition: A | Discharge status: Home / Self Care | Payer: Medicare (Managed Care) | Source: Ambulatory Visit | Attending: Family Medicine | Admitting: Family Medicine

## 2023-10-15 DIAGNOSIS — Z0000 Encounter for general adult medical examination without abnormal findings: Secondary | ICD-10-CM

## 2023-10-15 DIAGNOSIS — Z1231 Encounter for screening mammogram for malignant neoplasm of breast: Secondary | ICD-10-CM | POA: Insufficient documentation

## 2023-10-20 ENCOUNTER — Other Ambulatory Visit: Payer: Self-pay | Admitting: Pharmacist

## 2023-10-20 DIAGNOSIS — E1140 Type 2 diabetes mellitus with diabetic neuropathy, unspecified: Secondary | ICD-10-CM

## 2023-10-20 DIAGNOSIS — J410 Simple chronic bronchitis: Secondary | ICD-10-CM

## 2023-10-20 DIAGNOSIS — I10 Essential (primary) hypertension: Secondary | ICD-10-CM

## 2023-10-20 NOTE — Patient Instructions (Signed)
 Goals Addressed             This Visit's Progress    Pharmacy Goals       If you need to reach out to patient assistance programs regarding refills or to find out the status of your application, you can do so by calling:  [employer] at [phone] [employer] at [phone]  Check your blood pressure twice weekly, and any time you have concerning symptoms like headache, chest pain, dizziness, shortness of breath, or vision changes.   Our goal is less than 130/80.  To appropriately check your blood pressure, make sure you do the following:  1) Avoid caffeine, exercise, or tobacco products for 30 minutes before checking. Empty your bladder. 2) Sit with your back supported in a flat-backed chair. Rest your arm on something flat (arm of the chair, table, etc). 3) Sit still with your feet flat on the floor, resting, for at least 5 minutes.  4) Check your blood pressure. Take 1-2 readings.  5) Write down these readings and bring with you to any provider appointments.  Bring your home blood pressure machine with you to a provider's office for accuracy comparison at least once a year.   Make sure you take your blood pressure medications before you come to any office visit, even if you were asked to fast for labs.  Estelle Grumbles, PharmD, [facility] [phone]

## 2023-10-20 NOTE — Progress Notes (Signed)
 10/20/2023 Name: Charlene Hardy MRN: [id number] DOB: 03-01-1956  Chief Complaint  Patient presents with   Medication Management   Medication Assistance    Charlene Hardy is a 68 y.o. year old female who presented for a telephone visit.   They were referred to the pharmacist by their PCP for assistance in managing medication access.      Subjective:   Care Team: Primary Care Provider: Alba Cory, MD ; Next Scheduled Visit: 11/19/2023 Cardiologist: Debbe Odea, MD Endocrinologist: Erlene Quan, MD; Next Scheduled Lab Visit: 12/15/2023 Vein and Vascular Specialist: Georgiana Spinner, NP    Medication Access/Adherence  Current Pharmacy:  [facility] [address]), [place] - [address] [place]) [place] [address] Phone: [phone] Fax: [phone]   Patient reports affordability concerns with their medications: No Patient reports access/transportation concerns to their pharmacy: No  Patient reports adherence concerns with their medications:  No     Uses weekly pillbox to organize her medications      Diabetes:   Current medications: Ozempic 1 mg weekly on Saturdays (reports increased to current dose in early February)  Reports tolerating well   Medications tried in the past: Ozempic (cost)   Denies monitoring home blood sugar   Reports working on weight loss; States has noticed a change in the fit of her clothing since Ozempic dose increase and recalls when last checked, weight was ~251 lbs   Statin therapy: atorvastatin 40 mg daily   Current medication access support: Enrolled in patient assistance from [employer] for Ozempic from 08/22/2023 to 08/21/2024 per note from CPhT - Reports has >3 month supply remaining from assistance program    COPD:   Current medications:  - albuterol HFA inhaler - 2 puffs every 6 hours as needed for wheezing/shortness of breath - Breztri 160-9-4.8 mcg/act -  2 puffs into lungs twice daily   Reports tries to avoid triggers that worsen her breathing, such as cologne     Current medication access support: enrolled in patient assistance for Breztri from [employer] through 08/09/2024     Hypertension:   Current medications:  - amlodipine-benazepril 5-20 mg daily - metoprolol ER 50 mg daily - furosemide 20 mg daily as needed   Patient has a validated, automated, upper arm home BP cuff - Recalls last checked last week, reading ~130/70    Patient denies hypotensive s/sx including dizziness, lightheadedness.    Current physical activity: limited recently since retired a couple of months ago     Tobacco Abuse:   Reports has reduced to currently smoking ~1 pack/day   Triggers: stress   Barriers: appetite suppression with smoking; concerned about gaining weight if stops   Reports has previously tried nicotine patches, but did not notice benefit   Denies interest in setting quit date at this time, but plans to continue to work on cutting back     Objective:  Lab Results  Component Value Date   HGBA1C 6.1 (A) 04/23/2023    Lab Results  Component Value Date   CREATININE 0.81 07/01/2023   BUN 14 07/01/2023   NA 139 09/03/2022   K 4.7 09/03/2022   CL 103 09/03/2022   CO2 31 (A) 09/03/2022    Lab Results  Component Value Date   CHOL 143 09/03/2022   HDL 43 09/03/2022   LDLCALC 78 09/03/2022   TRIG 122 09/03/2022   CHOLHDL 3.0 10/21/2021   BP Readings from Last 3 Encounters:  10/01/23  136/82  07/01/23 (!) 132/99  06/15/23 (!) 157/78   Pulse Readings from Last 3 Encounters:  10/01/23 66  07/01/23 60  06/15/23 72     Current Outpatient Medications on File Prior to Visit  Medication Sig Dispense Refill   acetaminophen (TYLENOL) 500 MG tablet Take 1 tablet (500 mg total) by mouth 2 (two) times daily. 30 tablet 0   albuterol (VENTOLIN HFA) 108 (90 Base) MCG/ACT inhaler Inhale 2 puffs into the lungs every 6 (six) hours as  needed for wheezing or shortness of breath. 8 g 0   amLODipine-benazepril (LOTREL) 5-20 MG capsule Take 1 capsule by mouth daily. 90 capsule 1   aspirin 81 MG tablet Take 1 tablet by mouth daily.     atorvastatin (LIPITOR) 40 MG tablet Take 1 tablet (40 mg total) by mouth daily. 90 tablet 1   Blood Glucose Monitoring Suppl (ONETOUCH VERIO FLEX SYSTEM) w/Device KIT Use to check blood sugar up to twice daily as directed 1 kit 0   Budeson-Glycopyrrol-Formoterol (BREZTRI AEROSPHERE) 160-9-4.8 MCG/ACT AERO Inhale 2 puffs into the lungs 2 (two) times daily. 32.1 g 1   Cholecalciferol (VITAMIN D) 2000 UNITS tablet Take 1 tablet by mouth daily.     clopidogrel (PLAVIX) 75 MG tablet Take 1 tablet (75 mg total) by mouth daily. 30 tablet 6   ferrous sulfate 325 (65 FE) MG tablet Take 1 tablet by mouth daily.     furosemide (LASIX) 20 MG tablet Take 1 tablet (20 mg total) by mouth daily as needed. 90 tablet 1   gabapentin (NEURONTIN) 300 MG capsule Take 1 capsule (300 mg total) by mouth 3 (three) times daily. 270 capsule 1   glucose blood (ONETOUCH VERIO) test strip Use to check blood sugar up to twice daily 200 each 3   Lancets (ONETOUCH DELICA PLUS LANCET30G) MISC Use to check blood sugar up to twice daily 200 each 3   levothyroxine (SYNTHROID) 137 MCG tablet Take 137 mcg by mouth every morning.     metoprolol succinate (TOPROL-XL) 50 MG 24 hr tablet Take 1 tablet (50 mg total) by mouth daily. Take with or immediately following a meal. 90 tablet 1   MULTIPLE VITAMINS-MINERALS ER PO Take 1 tablet by mouth daily.     naftifine (NAFTIN) 1 % cream APPLY  CREAM TOPICALLY ONCE DAILY 90 g 0   omega-3 fish oil (MAXEPA) 1000 MG CAPS capsule Take 1 capsule by mouth daily.     Polyethylene Glycol 3350 POWD Take 1 Dose by mouth as needed.     potassium chloride SA (KLOR-CON M) 20 MEQ tablet Take 1 tablet (20 mEq total) by mouth daily. 90 tablet 1   Semaglutide, 1 MG/DOSE, 4 MG/3ML SOPN Inject 1 mg as directed once a  week. 9 mL 0   triamcinolone cream (KENALOG) 0.1 % Apply 1 application topically 2 (two) times daily. Mix with Eucerin or Nivea lotion ( at home  ) 453.6 g 0   No current facility-administered medications on file prior to visit.       Assessment/Plan:   Diabetes: - Currently controlled - Have counseled patient on strategies to aid with tolerability of Ozempic - Patient to follow up with [employer] patient assistance as needed for refills of Ozempic   COPD: - Reviewed appropriate inhaler technique. - Remind patient to contact [employer] program as needed for refills of Breztri inhaler     Hypertension: - Have reviewed long term cardiovascular and renal outcomes of uncontrolled blood pressure -  Have reviewed appropriate blood pressure monitoring technique and reviewed goal blood pressure.  - Patient to check home blood pressure and heart rate, keep log of results and have this record to review at upcoming medical appointments. Patient to contact provider office sooner if needed for readings outside of established parameters or symptoms   Tobacco Abuse - Have provided motivational interviewing to assess tobacco use and strategies for reduction       Follow Up Plan: Clinical Pharmacist will follow up with patient by telephone on 12/22/2023 at 3:00 PM    Estelle Grumbles, PharmD, [facility] [phone]

## 2023-11-01 ENCOUNTER — Other Ambulatory Visit (INDEPENDENT_AMBULATORY_CARE_PROVIDER_SITE_OTHER): Payer: Self-pay | Admitting: Nurse Practitioner

## 2023-11-01 ENCOUNTER — Other Ambulatory Visit: Payer: Self-pay | Admitting: Family Medicine

## 2023-11-01 DIAGNOSIS — R600 Localized edema: Secondary | ICD-10-CM

## 2023-11-01 DIAGNOSIS — I10 Essential (primary) hypertension: Secondary | ICD-10-CM

## 2023-11-01 DIAGNOSIS — E1169 Type 2 diabetes mellitus with other specified complication: Secondary | ICD-10-CM

## 2023-11-09 ENCOUNTER — Other Ambulatory Visit: Payer: Self-pay | Admitting: Family Medicine

## 2023-11-09 DIAGNOSIS — I10 Essential (primary) hypertension: Secondary | ICD-10-CM

## 2023-11-19 ENCOUNTER — Ambulatory Visit: Payer: Medicare (Managed Care) | Admitting: Family Medicine

## 2023-11-19 ENCOUNTER — Encounter: Payer: Self-pay | Admitting: Family Medicine

## 2023-11-19 VITALS — BP 134/80 | HR 82 | Resp 16 | Ht 67.0 in | Wt 248.7 lb

## 2023-11-19 DIAGNOSIS — E1169 Type 2 diabetes mellitus with other specified complication: Secondary | ICD-10-CM | POA: Diagnosis not present

## 2023-11-19 DIAGNOSIS — I6523 Occlusion and stenosis of bilateral carotid arteries: Secondary | ICD-10-CM | POA: Diagnosis not present

## 2023-11-19 DIAGNOSIS — E559 Vitamin D deficiency, unspecified: Secondary | ICD-10-CM | POA: Diagnosis not present

## 2023-11-19 DIAGNOSIS — E210 Primary hyperparathyroidism: Secondary | ICD-10-CM

## 2023-11-19 DIAGNOSIS — I10 Essential (primary) hypertension: Secondary | ICD-10-CM | POA: Diagnosis not present

## 2023-11-19 DIAGNOSIS — E039 Hypothyroidism, unspecified: Secondary | ICD-10-CM | POA: Diagnosis not present

## 2023-11-19 DIAGNOSIS — E785 Hyperlipidemia, unspecified: Secondary | ICD-10-CM

## 2023-11-19 DIAGNOSIS — L858 Other specified epidermal thickening: Secondary | ICD-10-CM

## 2023-11-19 DIAGNOSIS — E1140 Type 2 diabetes mellitus with diabetic neuropathy, unspecified: Secondary | ICD-10-CM | POA: Diagnosis not present

## 2023-11-19 DIAGNOSIS — J438 Other emphysema: Secondary | ICD-10-CM

## 2023-11-19 DIAGNOSIS — R600 Localized edema: Secondary | ICD-10-CM | POA: Insufficient documentation

## 2023-11-19 DIAGNOSIS — B353 Tinea pedis: Secondary | ICD-10-CM

## 2023-11-19 LAB — POCT GLYCOSYLATED HEMOGLOBIN (HGB A1C): Hemoglobin A1C: 5.4 % (ref 4.0–5.6)

## 2023-11-19 MED ORDER — ATORVASTATIN CALCIUM 40 MG PO TABS: 40.0000 mg | ORAL_TABLET | Freq: Every day | ORAL | 0 refills | Status: DC

## 2023-11-19 MED ORDER — AMLODIPINE BESY-BENAZEPRIL HCL 5-20 MG PO CAPS: 1.0000 | ORAL_CAPSULE | Freq: Every day | ORAL | 0 refills | Status: DC

## 2023-11-19 MED ORDER — METOPROLOL SUCCINATE ER 50 MG PO TB24: 50.0000 mg | ORAL_TABLET | Freq: Every day | ORAL | 1 refills | Status: DC

## 2023-11-19 MED ORDER — TRIAMCINOLONE ACETONIDE 0.1 % EX CREA: 1.0000 | TOPICAL_CREAM | Freq: Two times a day (BID) | CUTANEOUS | 0 refills | Status: AC

## 2023-11-19 MED ORDER — GABAPENTIN 300 MG PO CAPS: 300.0000 mg | ORAL_CAPSULE | Freq: Three times a day (TID) | ORAL | 1 refills | Status: DC

## 2023-11-19 MED ORDER — NAFTIFINE HCL 1 % EX CREA: TOPICAL_CREAM | CUTANEOUS | 0 refills | Status: AC

## 2023-11-19 MED ORDER — FUROSEMIDE 20 MG PO TABS: 20.0000 mg | ORAL_TABLET | Freq: Every day | ORAL | 0 refills | Status: DC | PRN

## 2023-11-19 MED ORDER — POTASSIUM CHLORIDE CRYS ER 20 MEQ PO TBCR: 20.0000 meq | EXTENDED_RELEASE_TABLET | Freq: Every day | ORAL | 1 refills | Status: DC

## 2023-11-19 NOTE — Progress Notes (Signed)
 Name: Charlene Hardy   MRN: [id number]    DOB: [birth year]   Date:11/19/2023       Progress Note  Subjective  Chief Complaint  Chief Complaint  Patient presents with   Medical Management of Chronic Issues   HPI   DMII: She has peripheral neuropathy , doing better on Gabapentin but still has some tingling and numbness on her feet - right worse than left   She denies polyphagia, but she has polydipsia ( likely secondary to  hyperparathyroidism )  she also has nocturia, she has dyslipidemia and HTN.  She takes statin therapy, ARB and also back on Ozempic since on Medicare and getting medication through assistance program, A1C is at goal. She is losing weight.    Morbid obesity: BMI is over 35  with co-morbidities such as DM, HTN and dyslipidemia. She is back on Ozempic and weight is down from 275 lbs in 04/2023 to 248.7 lbs today    Bilateral knee arthralgias: she states doing well today.  She is off Meloxicam because of decrease in kidney function and is taking Tylenol and Gabapentin. Right knee is worse than left    Emphysema septal: : She is still smoking and not ready to quit  She takes Breo daily, CT lung is up to date. She has a daily cough but not wheezing or sob    HTN: Taking lotrel our or Metoprolol  and furosemide , bp is at goal, no chest pain or palpitation    Dyslipidemia: she is taking statin therapy and we will recheck labs   Hypothyroidism: she is taking 150 mcg daily now since last TSH was high, labs done at Dr. Joaquin Bend office. She denies dysphagia . Weight is down. No change in bowel movements    Hyperparathyroidism: seeing Endocrinologist - Dr. Gershon Crane ,. She sates cramps improved. Had a bone density done 03/2022 and still has osteopenia but low FRAX score  She takes otc vitamin D . Last pth was 30  She denies cramps or GERD symptoms    Carotid atherosclerosis: seen by vascular surgeon had a cath done 118/2024   1.  Cervical right carotid artery was occluded with no  intracranial flow through the right carotid system. Right vertebral artery was large and without stenosis Left cervical carotid artery had mild disease in the 20 to 25% range Intracranial flow showed complete cross-filling from left to right and both the anterior and middle cerebral arteries consistent with chronic occlusion    Continue medical management and needs to stop smoking   Patient Active Problem List   Diagnosis Date Noted   Type 2 diabetes mellitus with diabetic neuropathy, unspecified (HCC) 08/02/2023   Lymphedema 07/24/2020   Chronic venous insufficiency 07/24/2020   Morbid obesity (HCC) 09/13/2018   Mild concentric left ventricular hypertrophy (LVH) 05/31/2018   Carotid stenosis 05/31/2018   Primary hyperparathyroidism (HCC) 02/28/2016   Arthralgia of both knees 10/23/2015   Chronic bronchitis (HCC) 10/23/2015   Osteopenia 10/23/2015   Left ankle pain 04/25/2015   Constipation 04/25/2015   Allergic rhinitis 01/20/2015   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/2015   Diabetic sensorimotor neuropathy (HCC) 01/20/2015   Dyslipidemia 01/20/2015   Essential (primary) hypertension 01/20/2015   H/O iron deficiency anemia 01/20/2015   Hypertensive retinopathy 01/20/2015   Adult hypothyroidism 01/20/2015   Background retinopathy due to secondary diabetes (HCC) 01/20/2015   Tinea pedis 01/20/2015   Vitamin D deficiency 02/26/2010   Cigarette smoker 03/06/2008    Past Surgical History:  Procedure Laterality Date  ABDOMINAL HYSTERECTOMY  08/11/1999   still has ovaries   BREAST BIOPSY Right 09/05/2018   Affirm Bx- Ribbon clip- path pending   CAROTID PTA/STENT INTERVENTION Right 07/01/2023   Procedure: CAROTID PTA/STENT INTERVENTION;  Surgeon: Annice Needy, MD;  Location: [facility] INVASIVE [facility];  Service: Cardiovascular;  Laterality: Right;   COLONOSCOPY  08/11/2015   COLONOSCOPY WITH PROPOFOL N/A 06/10/2016   Procedure: COLONOSCOPY WITH PROPOFOL;  Surgeon: Kieth Brightly, MD;   Location: [facility] ENDOSCOPY;  Service: Endoscopy;  Laterality: N/A;   COLONOSCOPY WITH PROPOFOL N/A 06/11/2021   Procedure: COLONOSCOPY WITH PROPOFOL;  Surgeon: Earline Mayotte, MD;  Location: [facility] ENDOSCOPY;  Service: Endoscopy;  Laterality: N/A;    Family History  Problem Relation Age of Onset   Multiple sclerosis Mother    Diabetes Father    Breast cancer Sister 17   Other Sister        DDD - she had to have back surgery   Hypertension Maternal Grandmother    Thyroid disease Paternal Uncle    Breast cancer Paternal Aunt    Heart Problems Paternal Aunt     Social History   Tobacco Use   Smoking status: Every Day    Current packs/day: 1.00    Average packs/day: 1 pack/day for 47.3 years (47.3 ttl pk-yrs)    Types: Cigarettes    Start date: 08/10/1976   Smokeless tobacco: Never   Tobacco comments:    cutting down, smoking half pack lately   Substance Use Topics   Alcohol use: No    Alcohol/week: 0.0 standard drinks of alcohol     Current Outpatient Medications:    acetaminophen (TYLENOL) 500 MG tablet, Take 1 tablet (500 mg total) by mouth 2 (two) times daily., Disp: 30 tablet, Rfl: 0   albuterol (VENTOLIN HFA) 108 (90 Base) MCG/ACT inhaler, Inhale 2 puffs into the lungs every 6 (six) hours as needed for wheezing or shortness of breath., Disp: 8 g, Rfl: 0   amLODipine-benazepril (LOTREL) 5-20 MG capsule, Take 1 capsule by mouth once daily, Disp: 90 capsule, Rfl: 0   aspirin 81 MG tablet, Take 1 tablet by mouth daily., Disp: , Rfl:    atorvastatin (LIPITOR) 40 MG tablet, Take 1 tablet by mouth once daily, Disp: 90 tablet, Rfl: 0   Blood Glucose Monitoring Suppl (ONETOUCH VERIO FLEX SYSTEM) w/Device KIT, Use to check blood sugar up to twice daily as directed, Disp: 1 kit, Rfl: 0   Budeson-Glycopyrrol-Formoterol (BREZTRI AEROSPHERE) 160-9-4.8 MCG/ACT AERO, Inhale 2 puffs into the lungs 2 (two) times daily., Disp: 32.1 g, Rfl: 1   Cholecalciferol (VITAMIN D) 2000 UNITS tablet,  Take 1 tablet by mouth daily., Disp: , Rfl:    clopidogrel (PLAVIX) 75 MG tablet, Take 1 tablet by mouth once daily, Disp: 90 tablet, Rfl: 3   ferrous sulfate 325 (65 FE) MG tablet, Take 1 tablet by mouth daily., Disp: , Rfl:    furosemide (LASIX) 20 MG tablet, TAKE 1 TABLET BY MOUTH ONCE DAILY AS NEEDED, Disp: 90 tablet, Rfl: 0   gabapentin (NEURONTIN) 300 MG capsule, Take 1 capsule (300 mg total) by mouth 3 (three) times daily., Disp: 270 capsule, Rfl: 1   glucose blood (ONETOUCH VERIO) test strip, Use to check blood sugar up to twice daily, Disp: 200 each, Rfl: 3   Lancets (ONETOUCH DELICA PLUS LANCET30G) MISC, Use to check blood sugar up to twice daily, Disp: 200 each, Rfl: 3   levothyroxine (SYNTHROID) 137 MCG tablet, Take 137 mcg  by mouth every morning., Disp: , Rfl:    metoprolol succinate (TOPROL-XL) 50 MG 24 hr tablet, TAKE 1 TABLET BY MOUTH ONCE DAILY WITH OR IMMEDIATELY FOLLOWING A MEAL, Disp: 30 tablet, Rfl: 0   MULTIPLE VITAMINS-MINERALS ER PO, Take 1 tablet by mouth daily., Disp: , Rfl:    naftifine (NAFTIN) 1 % cream, APPLY  CREAM TOPICALLY ONCE DAILY, Disp: 90 g, Rfl: 0   omega-3 fish oil (MAXEPA) 1000 MG CAPS capsule, Take 1 capsule by mouth daily., Disp: , Rfl:    Polyethylene Glycol 3350 POWD, Take 1 Dose by mouth as needed., Disp: , Rfl:    potassium chloride SA (KLOR-CON M) 20 MEQ tablet, Take 1 tablet (20 mEq total) by mouth daily., Disp: 90 tablet, Rfl: 1   Semaglutide, 1 MG/DOSE, 4 MG/3ML SOPN, Inject 1 mg as directed once a week., Disp: 9 mL, Rfl: 0   triamcinolone cream (KENALOG) 0.1 %, Apply 1 application topically 2 (two) times daily. Mix with Eucerin or Nivea lotion ( at home  ), Disp: 453.6 g, Rfl: 0  Allergies  Allergen Reactions   Hydrochlorothiazide     hyperparathyroidism   Pneumococcal Vaccine Rash   Pneumovax [Pneumococcal Polysaccharide Vaccine]     I personally reviewed active problem list, medication list, allergies with the patient/caregiver  today.   ROS  Ten systems reviewed and is negative except as mentioned in HPI    Objective Physical Exam Constitutional: Patient appears well-developed and well-nourished. Obese  No distress.  HEENT: head atraumatic, normocephalic, pupils equal and reactive to light, neck supple Cardiovascular: Normal rate, regular rhythm and normal heart sounds.  No murmur heard. Trace  BLE edema. Pulmonary/Chest: Effort normal and breath sounds normal. No respiratory distress. Abdominal: Soft.  There is no tenderness. Psychiatric: Patient has a normal mood and affect. behavior is normal. Judgment and thought content normal.   Vitals:   11/19/23 0851  BP: 134/80  Pulse: 82  Resp: 16  Weight: 248 lb 11.2 oz (112.8 kg)  Height: 5\' 7"  (1.702 m)    Body mass index is 38.95 kg/m.  Recent Results (from the past 2160 hours)  POCT glycosylated hemoglobin (Hb A1C)     Status: None   Collection Time: 11/19/23  8:56 AM  Result Value Ref Range   Hemoglobin A1C 5.4 4.0 - 5.6 %   HbA1c POC (<> result, manual entry)     HbA1c, POC (prediabetic range)     HbA1c, POC (controlled diabetic range)      Diabetic Foot Exam:     PHQ2/9:    11/19/2023    8:44 AM 06/03/2023   11:45 AM 04/23/2023    9:50 AM 10/21/2022    8:01 AM 04/21/2022    9:07 AM  Depression screen PHQ 2/9  Decreased Interest 0 0 0 0 0  Down, Depressed, Hopeless 0 0 0 0 0  PHQ - 2 Score 0 0 0 0 0  Altered sleeping 0 0 0 0 0  Tired, decreased energy 0 0 0 0 0  Change in appetite 0 0 0 0 0  Feeling bad or failure about yourself  0 0 0 0 0  Trouble concentrating 0 0 0 0 0  Moving slowly or fidgety/restless 0 0 0 0 0  Suicidal thoughts 0 0 0 0 0  PHQ-9 Score 0 0 0 0 0  Difficult doing work/chores Not difficult at all  Not difficult at all      phq 9 is negative  Fall Risk:  11/19/2023    8:44 AM 06/03/2023   11:53 AM 04/23/2023    9:50 AM 10/21/2022    8:01 AM 04/21/2022    9:07 AM  Fall Risk   Falls in the past year? 0 0  0 0 0  Number falls in past yr: 0  0 0 0  Injury with Fall? 0  0 0 0  Risk for fall due to : No Fall Risks No Fall Risks No Fall Risks No Fall Risks No Fall Risks  Follow up Falls prevention discussed;Education provided;Falls evaluation completed Falls prevention discussed;Education provided;Falls evaluation completed Falls prevention discussed;Education provided;Falls evaluation completed Falls prevention discussed Falls prevention discussed     Assessment & Plan  1. Type 2 diabetes, controlled, with neuropathy (HCC) (Primary)  - POCT glycosylated hemoglobin (Hb A1C) - Microalbumin / creatinine urine ratio - gabapentin (NEURONTIN) 300 MG capsule; Take 1 capsule (300 mg total) by mouth 3 (three) times daily.  Dispense: 270 capsule; Refill: 1  2. Bilateral carotid artery stenosis  - Lipid panel  3. Adult hypothyroidism  - TSH  4. Paraseptal emphysema (HCC)  On Breo   5. Bilateral edema of lower extremity  - furosemide (LASIX) 20 MG tablet; Take 1 tablet (20 mg total) by mouth daily as needed.  Dispense: 90 tablet; Refill: 0 - potassium chloride SA (KLOR-CON M) 20 MEQ tablet; Take 1 tablet (20 mEq total) by mouth daily.  Dispense: 90 tablet; Refill: 1  6. Primary hyperparathyroidism (HCC)  - Parathyroid hormone, intact (no Ca)  7. Morbid obesity (HCC)  Discussed with the patient the risk posed by an increased BMI. Discussed importance of portion control, calorie counting and at least 150 minutes of physical activity weekly. Avoid sweet beverages and drink more water. Eat at least 6 servings of fruit and vegetables daily    8. Essential (primary) hypertension  - CBC with Differential/Platelet - Comprehensive metabolic panel with GFR - amLODipine-benazepril (LOTREL) 5-20 MG capsule; Take 1 capsule by mouth daily.  Dispense: 90 capsule; Refill: 0 - metoprolol succinate (TOPROL-XL) 50 MG 24 hr tablet; Take 1 tablet (50 mg total) by mouth daily. Take with or immediately  following a meal.  Dispense: 90 tablet; Refill: 1  9. Vitamin D deficiency  - VITAMIN D 25 Hydroxy (Vit-D Deficiency, Fractures)  10. Dyslipidemia associated with type 2 diabetes mellitus (HCC)  - atorvastatin (LIPITOR) 40 MG tablet; Take 1 tablet (40 mg total) by mouth daily.  Dispense: 90 tablet; Refill: 0  11. Tinea pedis of both feet  - naftifine (NAFTIN) 1 % cream; APPLY  CREAM TOPICALLY ONCE DAILY  Dispense: 90 g; Refill: 0  12. Keratosis pilaris  - triamcinolone cream (KENALOG) 0.1 %; Apply 1 Application topically 2 (two) times daily. Mix with Eucerin or Nivea lotion ( at home  )  Dispense: 453.6 g; Refill: 0

## 2023-11-20 LAB — LIPID PANEL
Cholesterol: 97 mg/dL (ref <200)
HDL: 33 mg/dL — ABNORMAL LOW (ref >=50)
LDL Cholesterol (Calc): 44 mg/dL
Non-HDL Cholesterol (Calc): 64 mg/dL (ref <130)
Total CHOL/HDL Ratio: 2.9 (calc) (ref <5.0)
Triglycerides: 118 mg/dL (ref <150)

## 2023-11-20 LAB — CBC WITH DIFFERENTIAL/PLATELET
Absolute Lymphocytes: 1947 {cells}/uL (ref 850–3900)
Absolute Monocytes: 628 {cells}/uL (ref 200–950)
Basophils Absolute: 36 {cells}/uL (ref 0–200)
Basophils Relative: 0.4 %
Eosinophils Absolute: 309 {cells}/uL (ref 15–500)
Eosinophils Relative: 3.4 %
HCT: 42.7 % (ref 35.0–45.0)
Hemoglobin: 13.4 g/dL (ref 11.7–15.5)
MCH: 26.1 pg — ABNORMAL LOW (ref 27.0–33.0)
MCHC: 31.4 g/dL — ABNORMAL LOW (ref 32.0–36.0)
MCV: 83.1 fL (ref 80.0–100.0)
MPV: 11.3 fL (ref 7.5–12.5)
Monocytes Relative: 6.9 %
Neutro Abs: 6179 {cells}/uL (ref 1500–7800)
Neutrophils Relative %: 67.9 %
Platelets: 297 10*3/uL (ref 140–400)
RBC: 5.14 10*6/uL — ABNORMAL HIGH (ref 3.80–5.10)
RDW: 13 % (ref 11.0–15.0)
Total Lymphocyte: 21.4 %
WBC: 9.1 10*3/uL (ref 3.8–10.8)

## 2023-11-20 LAB — COMPREHENSIVE METABOLIC PANEL WITH GFR
AG Ratio: 1.5 (calc) (ref 1.0–2.5)
ALT: 7 U/L (ref 6–29)
AST: 10 U/L (ref 10–35)
Albumin: 4.1 g/dL (ref 3.6–5.1)
Alkaline phosphatase (APISO): 102 U/L (ref 37–153)
BUN: 9 mg/dL (ref 7–25)
CO2: 31 mmol/L (ref 20–32)
Calcium: 10.9 mg/dL — ABNORMAL HIGH (ref 8.6–10.4)
Chloride: 104 mmol/L (ref 98–110)
Creat: 0.69 mg/dL (ref 0.50–1.05)
Globulin: 2.8 g/dL (ref 1.9–3.7)
Glucose, Bld: 69 mg/dL (ref 65–99)
Potassium: 4.4 mmol/L (ref 3.5–5.3)
Sodium: 138 mmol/L (ref 135–146)
Total Bilirubin: 0.4 mg/dL (ref 0.2–1.2)
Total Protein: 6.9 g/dL (ref 6.1–8.1)
eGFR: 95 mL/min/{1.73_m2} (ref >=60)

## 2023-11-20 LAB — MICROALBUMIN / CREATININE URINE RATIO
Creatinine, Urine: 64 mg/dL (ref 20–275)
Microalb Creat Ratio: 8 mg/g{creat} (ref <30)
Microalb, Ur: 0.5 mg/dL

## 2023-11-20 LAB — PARATHYROID HORMONE, INTACT (NO CA): PTH: 70 pg/mL (ref 16–77)

## 2023-11-20 LAB — VITAMIN D 25 HYDROXY (VIT D DEFICIENCY, FRACTURES): Vit D, 25-Hydroxy: 43 ng/mL (ref 30–100)

## 2023-11-20 LAB — TSH: TSH: 0.71 m[IU]/L (ref 0.40–4.50)

## 2023-12-02 ENCOUNTER — Telehealth: Payer: Self-pay

## 2023-12-02 DIAGNOSIS — J410 Simple chronic bronchitis: Secondary | ICD-10-CM

## 2023-12-02 NOTE — Telephone Encounter (Signed)
 Received a request from [employer] for new rx for Breztri  to be fax to [facility] AT [phone]

## 2023-12-03 ENCOUNTER — Other Ambulatory Visit: Payer: Self-pay | Admitting: Family Medicine

## 2023-12-03 DIAGNOSIS — J410 Simple chronic bronchitis: Secondary | ICD-10-CM

## 2023-12-03 MED ORDER — BREZTRI AEROSPHERE 160-9-4.8 MCG/ACT IN AERO: 2.0000 | INHALATION_SPRAY | Freq: Two times a day (BID) | RESPIRATORY_TRACT | 3 refills | Status: AC

## 2023-12-03 NOTE — Addendum Note (Signed)
 Addended by: RENTERIA-GARCIA, Kathia Covington on: 12/03/2023 10:15 AM   Modules accepted: Orders

## 2023-12-03 NOTE — Addendum Note (Signed)
 Addended by: Arthur Lash A on: 12/03/2023 09:04 AM   Modules accepted: Orders

## 2023-12-15 DIAGNOSIS — E039 Hypothyroidism, unspecified: Secondary | ICD-10-CM | POA: Diagnosis not present

## 2023-12-22 ENCOUNTER — Other Ambulatory Visit: Payer: Medicare (Managed Care) | Admitting: Pharmacist

## 2023-12-22 DIAGNOSIS — J410 Simple chronic bronchitis: Secondary | ICD-10-CM

## 2023-12-22 DIAGNOSIS — I10 Essential (primary) hypertension: Secondary | ICD-10-CM

## 2023-12-22 DIAGNOSIS — E1140 Type 2 diabetes mellitus with diabetic neuropathy, unspecified: Secondary | ICD-10-CM

## 2023-12-22 NOTE — Patient Instructions (Signed)
 Goals Addressed             This Visit's Progress    Pharmacy Goals       If you need to reach out to patient assistance programs regarding refills or to find out the status of your application, you can do so by calling:  [employer] at [phone] [employer] at [phone]  Check your blood pressure twice weekly, and any time you have concerning symptoms like headache, chest pain, dizziness, shortness of breath, or vision changes.   Our goal is less than 130/80.  To appropriately check your blood pressure, make sure you do the following:  1) Avoid caffeine, exercise, or tobacco products for 30 minutes before checking. Empty your bladder. 2) Sit with your back supported in a flat-backed chair. Rest your arm on something flat (arm of the chair, table, etc). 3) Sit still with your feet flat on the floor, resting, for at least 5 minutes.  4) Check your blood pressure. Take 1-2 readings.  5) Write down these readings and bring with you to any provider appointments.  Bring your home blood pressure machine with you to a provider's office for accuracy comparison at least once a year.   Make sure you take your blood pressure medications before you come to any office visit, even if you were asked to fast for labs.  Estelle Grumbles, PharmD, [facility] [phone]

## 2023-12-22 NOTE — Progress Notes (Signed)
 12/22/2023 Name: Charlene Hardy MRN: [id number] DOB: 1955-10-20  Chief Complaint  Patient presents with   Medication Management   Medication Assistance    Kattaleya Liner is a 68 y.o. year old female who presented for a telephone visit.   They were referred to the pharmacist by their PCP for assistance in managing medication access.      Subjective:   Care Team: Primary Care Provider: Sowles, Krichna, MD ; Next Scheduled Visit: 03/20/2024 Cardiologist: Constancia Delton, MD Endocrinologist: Tyron Gallon, MD; Next Scheduled Lab Visit: 05/04/2024 Vein and Vascular Specialist: Brown, Fallon E, NP  Medication Access/Adherence  Current Pharmacy:  [facility] [address]), [place] - [address] [place]) [place] [address] Phone: [phone] Fax: [phone]  [facility] - [place], [place] - [address] [place] [address] Phone: [phone] Fax: [phone]   Patient reports affordability concerns with their medications: No Patient reports access/transportation concerns to their pharmacy: No  Patient reports adherence concerns with their medications:  No     Uses weekly pillbox to organize her medications      Diabetes:   Current medications: Ozempic  1 mg weekly on Saturdays (reports increased to current dose in early February)             Reports tolerating well   Medications tried in the past: Ozempic  (cost)   Denies checking home blood sugar recently   Reports working on weight loss; States has noticed a change in the fit of her clothing since Ozempic  dose increase and recalls when last checked, weight was ~248 lbs  Reports has cut back on drinking soda   Statin therapy: atorvastatin  40 mg daily  Current physical activity: limited to movement around her home   Current medication access support: Enrolled in patient assistance from [employer] for Ozempic  from 08/22/2023 to  08/21/2024 per note from CPhT     COPD:   Current medications:  - albuterol  HFA inhaler - 2 puffs every 6 hours as needed for wheezing/shortness of breath - Breztri  160-9-4.8 mcg/act - 2 puffs into lungs twice daily   Reports tries to avoid triggers that worsen her breathing, such as cologne     Current medication access support: enrolled in patient assistance for Breztri  from [employer] through 08/09/2024     Hypertension:   Current medications:  - amlodipine -benazepril  5-20 mg daily - metoprolol  ER 50 mg daily - furosemide  20 mg daily as needed   Patient has a validated, automated, upper arm home BP cuff - Recalls last checked last week, reading ~110/70    Patient denies hypotensive s/sx including dizziness, lightheadedness.    Current physical activity: limited to movement around her home   Tobacco Abuse:  Currently smoking ~1 pack/day  Triggers: stress   Reports has previously tried nicotine  patches, but did not notice benefit     Objective:  Lab Results  Component Value Date   HGBA1C 5.4 11/19/2023    Lab Results  Component Value Date   CREATININE 0.69 11/19/2023   BUN 9 11/19/2023   NA 138 11/19/2023   K 4.4 11/19/2023   CL 104 11/19/2023   CO2 31 11/19/2023    Lab Results  Component Value Date   CHOL 97 11/19/2023   HDL 33 (L) 11/19/2023   LDLCALC 44 11/19/2023   TRIG 118 11/19/2023   CHOLHDL 2.9 11/19/2023   BP Readings from Last 3 Encounters:  11/19/23 134/80  10/01/23 136/82  07/01/23 (!) 132/99   Pulse Readings from Last 3 Encounters:  11/19/23 82  10/01/23 66  07/01/23 60     Medications Reviewed Today     Reviewed by Ardis Becton, RPH-CPP (Pharmacist) on 12/22/23 at 1507  Med List Status: <None>   Medication Order Taking? Sig Documenting Provider Last Dose Status Informant  acetaminophen  (TYLENOL ) 500 MG tablet [id number]  Take 1 tablet (500 mg total) by mouth 2 (two) times daily. Sowles, Krichna, MD  Active   albuterol   (VENTOLIN  HFA) 108 [phone] Base) MCG/ACT inhaler [id number]  Inhale 2 puffs into the lungs every 6 (six) hours as needed for wheezing or shortness of breath. Sowles, Krichna, MD  Active   amLODipine -benazepril  (LOTREL) 5-20 MG capsule [id number]  Take 1 capsule by mouth daily. Sowles, Krichna, MD  Active   aspirin 81 MG tablet [id number]  Take 1 tablet by mouth daily. [provider]  Active            Med Note Tommas Fragmin, [facility]   Fri Apr 18, 2021  8:46 AM)    atorvastatin  (LIPITOR) 40 MG tablet [id number]  Take 1 tablet (40 mg total) by mouth daily. Sowles, Krichna, MD  Active   Blood Glucose Monitoring Suppl (ONETOUCH VERIO FLEX SYSTEM) w/Device Suzanne Erps [id number]  Use to check blood sugar up to twice daily as directed Sowles, Krichna, MD  Active   budeson-glycopyrrolate-formoterol (BREZTRI  AEROSPHERE) 160-9-4.8 MCG/ACT AERO inhaler [id number]  Inhale 2 puffs into the lungs 2 (two) times daily. Sowles, Krichna, MD  Active   Cholecalciferol (VITAMIN D ) 2000 UNITS tablet [id number]  Take 1 tablet by mouth daily. [provider]  Active            Med Note Tommas Fragmin, [facility]   Fri Apr 18, 2021  8:46 AM)    clopidogrel  (PLAVIX ) 75 MG tablet [id number]  Take 1 tablet by mouth once daily Brown, Fallon E, NP  Active   ferrous sulfate 325 (65 FE) MG tablet [id number]  Take 1 tablet by mouth daily. [provider]  Active            Med Note Tommas Fragmin, [facility]   Fri Apr 18, 2021  8:46 AM)    furosemide  (LASIX ) 20 MG tablet [id number]  Take 1 tablet (20 mg total) by mouth daily as needed. Sowles, Krichna, MD  Active   gabapentin  (NEURONTIN ) 300 MG capsule [id number]  Take 1 capsule (300 mg total) by mouth 3 (three) times daily. Sowles, Krichna, MD  Active   glucose blood [facility] VERIO) test strip [id number]  Use to check blood sugar up to twice daily Sowles, Krichna, MD  Active   Lancets [facility] Jewelene Morton PLUS [place]) MISC [id number]  Use to check blood sugar up to twice daily Sowles, Krichna, MD  Active    metoprolol  succinate (TOPROL -XL) 50 MG 24 hr tablet [id number]  Take 1 tablet (50 mg total) by mouth daily. Take with or immediately following a meal. Sowles, Krichna, MD  Active   MULTIPLE VITAMINS-MINERALS ER PO [id number]  Take 1 tablet by mouth daily. [provider]  Active            Med Note Tommas Fragmin, [facility]   Fri Apr 18, 2021  8:46 AM)    naftifine  (NAFTIN ) 1 % cream [id number]  APPLY  CREAM TOPICALLY ONCE DAILY Sowles, Krichna, MD  Active   omega-3 fish oil (MAXEPA) 1000 MG CAPS capsule [id number]  Take 1 capsule by mouth daily. [provider]  Active            Med Note Elissa Guise   Fri Apr 18, 2021  8:46 AM)    Polyethylene Glycol 3350  POWD [id number]  Take 1 Dose by mouth as needed. [provider]  Active            Med Note Tommas Fragmin, [facility]   Fri Apr 18, 2021  8:46 AM)    potassium chloride  SA (KLOR-CON  M) 20 MEQ tablet [id number]  Take 1 tablet (20 mEq total) by mouth daily. Sowles, Krichna, MD  Active   Semaglutide , 1 MG/DOSE, 4 MG/3ML SOPN [id number] Yes Inject 1 mg as directed once a week. Sowles, Krichna, MD Taking Active   triamcinolone  cream (KENALOG ) 0.1 % [id number]  Apply 1 Application topically 2 (two) times daily. Mix with Eucerin or Nivea lotion ( at home  ) Sowles, Krichna, MD  Active               Assessment/Plan:   Diabetes: - Currently controlled - Have counseled patient on strategies to aid with tolerability of Ozempic  - Patient to follow up with [employer] patient assistance as needed for refills of Ozempic    COPD: - Reviewed appropriate inhaler technique. - Remind patient to contact [employer] program as needed for refills of Breztri  inhaler     Hypertension: - Have reviewed long term cardiovascular and renal outcomes of uncontrolled blood pressure - Have reviewed appropriate blood pressure monitoring technique and reviewed goal blood pressure.  - Patient to check home blood pressure and heart rate, keep log of results and  have this record to review at upcoming medical appointments. Patient to contact provider office sooner if needed for readings outside of established parameters or symptoms   Tobacco Abuse - Provided motivational interviewing to assess tobacco use and strategies for reduction - Provided information on 1 800 QUIT NOW support program - - start nicotine  patch 21 mg daily. Counseled on proper placement and potential side effects, including mild itching/redness at the location site, headache, trouble sleeping and/or vivid dreams. Advised to remove patch at night if development of trouble sleeping.  - Patch Schedule for >10 cigarettes daily: Apply one 21 mg patch daily for 6 weeks. Then, reduce to one 14 mg patch daily for another 2 weeks, if able. Then, reduce to one 7 mg patch for another 2 weeks, if able.  - start nicotine  gum 4 mg as needed. Counseled on chew and park method and potential side effects, including nausea, hiccups, cough, and heartburn. Advised to minimize the amount of nicotine  swallowed to reduce incidence of GI side effects    Follow Up Plan: Clinical Pharmacist will follow up with patient by telephone on 05/17/2024 at 3:00 PM    Arthur Lash, PharmD, [facility] [phone]

## 2023-12-29 ENCOUNTER — Other Ambulatory Visit: Payer: Self-pay | Admitting: Family Medicine

## 2024-02-25 ENCOUNTER — Other Ambulatory Visit: Payer: Self-pay | Admitting: Pharmacist

## 2024-02-25 DIAGNOSIS — E1140 Type 2 diabetes mellitus with diabetic neuropathy, unspecified: Secondary | ICD-10-CM

## 2024-02-25 NOTE — Progress Notes (Signed)
   02/25/2024  Patient ID: Charlene Hardy, female   DOB: Dec 08, 1955, 68 y.o.   MRN: [id number]  Patient enrolled in Ozempic  patient assistance program from [employer].  As of 7/25, Ozempic  will no longer be eligible for automatic refill from [employer]. To request refills after this date, program must receive a refill request form from office up to 30 days prior to refill being due.  Outreach to patient today and provide this update. Advise patient to monitor her supply of Ozempic  at home and when has only a 1 month supply remaining, to contact Shasta Spear: Phone: [phone] to request CPhT start refill form with PCP to be sent to program.  Sharyle Sia, PharmD, [facility] [phone]

## 2024-02-25 NOTE — Patient Instructions (Signed)
 As of 7/25, Ozempic  will no longer be automatically refilled from [employer] patient assistance program. To request refills after this date, program must receive a refill request form from office up to 30 days prior to refill being due.  Please monitor your supply of Ozempic  at home and when you have only a 1 month supply remaining, contact Shasta Spear at [phone] to request she start refill process for you.  Thank you!  Sharyle Sia, PharmD, [facility] Clinical Pharmacist [facility] [phone]

## 2024-03-08 ENCOUNTER — Telehealth: Payer: Self-pay

## 2024-03-08 NOTE — Telephone Encounter (Signed)
 Received a refill reorder on Novo Nordisk (Ozempic ),fill and fax reorder form to provider office to sign and date and fax to [employer] along the letter to [phone].

## 2024-03-09 NOTE — Telephone Encounter (Signed)
 Received provider portion refill resquest form back from provider today ,fax it to [employer] today.

## 2024-03-20 ENCOUNTER — Ambulatory Visit: Payer: Medicare (Managed Care) | Admitting: Family Medicine

## 2024-03-20 ENCOUNTER — Encounter: Payer: Self-pay | Admitting: Family Medicine

## 2024-03-20 VITALS — BP 116/72 | HR 97 | Resp 16 | Ht 67.0 in | Wt 229.5 lb

## 2024-03-20 DIAGNOSIS — E039 Hypothyroidism, unspecified: Secondary | ICD-10-CM

## 2024-03-20 DIAGNOSIS — E1140 Type 2 diabetes mellitus with diabetic neuropathy, unspecified: Secondary | ICD-10-CM | POA: Diagnosis not present

## 2024-03-20 DIAGNOSIS — E785 Hyperlipidemia, unspecified: Secondary | ICD-10-CM

## 2024-03-20 DIAGNOSIS — E1169 Type 2 diabetes mellitus with other specified complication: Secondary | ICD-10-CM

## 2024-03-20 DIAGNOSIS — J438 Other emphysema: Secondary | ICD-10-CM | POA: Diagnosis not present

## 2024-03-20 DIAGNOSIS — I10 Essential (primary) hypertension: Secondary | ICD-10-CM

## 2024-03-20 DIAGNOSIS — E210 Primary hyperparathyroidism: Secondary | ICD-10-CM

## 2024-03-20 DIAGNOSIS — R600 Localized edema: Secondary | ICD-10-CM

## 2024-03-20 DIAGNOSIS — I6523 Occlusion and stenosis of bilateral carotid arteries: Secondary | ICD-10-CM

## 2024-03-20 DIAGNOSIS — Z720 Tobacco use: Secondary | ICD-10-CM

## 2024-03-20 DIAGNOSIS — I700 Atherosclerosis of aorta: Secondary | ICD-10-CM

## 2024-03-20 LAB — POCT GLYCOSYLATED HEMOGLOBIN (HGB A1C): Hemoglobin A1C: 5.4 % (ref 4.0–5.6)

## 2024-03-20 MED ORDER — ATORVASTATIN CALCIUM 40 MG PO TABS: 40.0000 mg | ORAL_TABLET | Freq: Every day | ORAL | 1 refills | Status: AC

## 2024-03-20 MED ORDER — METOPROLOL SUCCINATE ER 50 MG PO TB24: 50.0000 mg | ORAL_TABLET | Freq: Every day | ORAL | 1 refills | Status: AC

## 2024-03-20 MED ORDER — VARENICLINE TARTRATE 0.5 MG PO TABS: 0.5000 mg | ORAL_TABLET | Freq: Two times a day (BID) | ORAL | 0 refills | Status: DC

## 2024-03-20 MED ORDER — AMLODIPINE BESY-BENAZEPRIL HCL 5-20 MG PO CAPS: 1.0000 | ORAL_CAPSULE | Freq: Every day | ORAL | 1 refills | Status: AC

## 2024-03-20 MED ORDER — FUROSEMIDE 20 MG PO TABS
20.0000 mg | ORAL_TABLET | Freq: Every day | ORAL | 1 refills | Status: AC | PRN
Start: 2024-03-20 — End: ?

## 2024-03-20 MED ORDER — POTASSIUM CHLORIDE CRYS ER 20 MEQ PO TBCR: 20.0000 meq | EXTENDED_RELEASE_TABLET | Freq: Every day | ORAL | 1 refills | Status: AC

## 2024-03-20 MED ORDER — GABAPENTIN 300 MG PO CAPS: 300.0000 mg | ORAL_CAPSULE | Freq: Three times a day (TID) | ORAL | 1 refills | Status: AC

## 2024-03-20 MED ORDER — VARENICLINE TARTRATE 1 MG PO TABS: 1.0000 mg | ORAL_TABLET | Freq: Two times a day (BID) | ORAL | 0 refills | Status: DC

## 2024-03-20 NOTE — Progress Notes (Signed)
 Name: Charlene Hardy   MRN: [id number]    DOB: 08/16/1955   Date:03/20/2024       Progress Note  Subjective  Chief Complaint  Chief Complaint  Patient presents with   Medical Management of Chronic Issues    Pt did not bring atorvastatin  or plavix  in so unsure if she is taking them. Req Eye exam from [facility] and [facility]   Discussed the use of AI scribe software for clinical note transcription with the patient, who gave verbal consent to proceed.  History of Present Illness Charlene Hardy is a 68 year old female who presents for a regular follow-up visit.  She has  primary hyperparathyroidism with previously recorded parathyroid  hormone (PTH) levels of 106, decreasing to 89, 80, and 70. Her calcium  levels were slightly elevated at 10.9 in April. She is not taking any calcium  supplements, and her vitamin D  levels are normal. No symptoms such as kidney stones, reflux, or muscle cramps are present.  She has adult hypothyroidism with thyroid  levels at goal in April but too low in May, indicating excessive thyroid  medication. She is currently taking 137 mcg of thyroid  medication daily, however looks like last rx filled based on Epic was 150 mcg. She has an upcoming appointment in September for further evaluation. No symptoms of hyperthyroidism are reported.  Her type 2 diabetes is well controlled with an A1c of 5.4. She is taking Ozempic  1 mg weekly for diabetes management. She also has diabetic neuropathy, for which she takes gabapentin  300 mg three times a day, which helps with symptoms of tingling and numbness. She also has a history of diabetic retinopathy but we need to obtain the most current records   She has dyslipidemia and is taking atorvastatin . Her LDL cholesterol is 44, but her HDL cholesterol is low at 33.  She experiences arthritis-related joint pain, which she manages with Tylenol  500 mg, taking up to six tablets a day as needed. The pain can shift between her knees and  shoulders.  She has chronic venous insufficiency and lymphedema in her legs, for which she takes Lasix  as needed.   She also has paraseptal emphysema and uses Breztri  for symptom management. She reports coughing when hot but no other significant respiratory symptoms.  She smokes about a pack a day and is considering quitting.  She has a history of atherosclerosis and sees a vascular doctor for plaque in her carotids. She takes Plavix  as recommended by her vascular doctor. No stroke symptoms or headaches.  She reports frequent urination, which she attributes to taking furosemide . She is also working on [employer], having lost nearly 20 pounds since starting Ozempic  in November. She walks about 20 minutes every other day and is trying to control portion sizes.    Patient Active Problem List   Diagnosis Date Noted   Paraseptal emphysema (HCC) 11/19/2023   Bilateral edema of lower extremity 11/19/2023   Type 2 diabetes, controlled, with neuropathy (HCC) 08/02/2023   Lymphedema 07/24/2020   Chronic venous insufficiency 07/24/2020   Morbid obesity (HCC) 09/13/2018   Mild concentric left ventricular hypertrophy (LVH) 05/31/2018   Carotid stenosis 05/31/2018   Primary hyperparathyroidism (HCC) 02/28/2016   Arthralgia of both knees 10/23/2015   Chronic bronchitis (HCC) 10/23/2015   Osteopenia 10/23/2015   Left ankle pain 04/25/2015   Constipation 04/25/2015   Allergic rhinitis 01/20/2015   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/2015   Diabetic sensorimotor neuropathy (HCC) 01/20/2015   Dyslipidemia 01/20/2015   Essential (primary) hypertension 01/20/2015  H/O iron deficiency anemia 01/20/2015   Hypertensive retinopathy 01/20/2015   Adult hypothyroidism 01/20/2015   Background retinopathy due to secondary diabetes (HCC) 01/20/2015   Tinea pedis 01/20/2015   Vitamin D  deficiency 02/26/2010   Cigarette smoker 03/06/2008    Past Surgical History:  Procedure Laterality Date   ABDOMINAL  HYSTERECTOMY  08/11/1999   still has ovaries   BREAST BIOPSY Right 09/05/2018   Affirm Bx- Ribbon clip- path pending   CAROTID PTA/STENT INTERVENTION Right 07/01/2023   Procedure: CAROTID PTA/STENT INTERVENTION;  Surgeon: Marea Selinda RAMAN, MD;  Location: [facility];  Service: Cardiovascular;  Laterality: Right;   COLONOSCOPY  08/11/2015   COLONOSCOPY WITH PROPOFOL  N/A 06/10/2016   Procedure: COLONOSCOPY WITH PROPOFOL ;  Surgeon: Louanne KANDICE Muse, MD;  Location: [facility] END[facility];  Service: Endoscopy;  Laterality: N/A;   COLONOSCOPY WITH PROPOFOL  N/A 06/11/2021   Procedure: COLONOSCOPY WITH PROPOFOL ;  Surgeon: Dessa Reyes ORN, MD;  Location: [facility] ENDOSCOPY;  Service: Endoscopy;  Laterality: N/A;    Family History  Problem Relation Age of Onset   Multiple sclerosis Mother    Diabetes Father    Breast cancer Sister 53   Other Sister        DDD - she had to have back surgery   Hypertension Maternal Grandmother    Thyroid  disease Paternal Uncle    Breast cancer Paternal Aunt    Heart Problems Paternal Aunt     Social History   Tobacco Use   Smoking status: Every Day    Current packs/day: 1.00    Average packs/day: 1 pack/day for 47.6 years (47.6 ttl pk-yrs)    Types: Cigarettes    Start date: 08/10/1976   Smokeless tobacco: Never   Tobacco comments:    cutting down, smoking half pack lately   Substance Use Topics   Alcohol use: No    Alcohol/week: 0.0 standard drinks of alcohol     Current Outpatient Medications:    acetaminophen  (TYLENOL ) 500 MG tablet, Take 1 tablet (500 mg total) by mouth 2 (two) times daily., Disp: 30 tablet, Rfl: 0   albuterol  (VENTOLIN  HFA) 108 (90 Base) MCG/ACT inhaler, INHALE 2 PUFFS BY MOUTH EVERY 6 HOURS AS NEEDED FOR WHEEZING FOR SHORTNESS OF BREATH, Disp: 9 g, Rfl: 0   amLODipine -benazepril  (LOTREL) 5-20 MG capsule, Take 1 capsule by mouth daily., Disp: 90 capsule, Rfl: 0   aspirin 81 MG tablet, Take 1 tablet by mouth daily., Disp: , Rfl:     Blood Glucose Monitoring Suppl (ONETOUCH VERIO FLEX SYSTEM) w/Device KIT, Use to check blood sugar up to twice daily as directed, Disp: 1 kit, Rfl: 0   budeson-glycopyrrolate-formoterol (BREZTRI  AEROSPHERE) 160-9-4.8 MCG/ACT AERO inhaler, Inhale 2 puffs into the lungs 2 (two) times daily., Disp: 32.1 g, Rfl: 3   Cholecalciferol (VITAMIN D ) 2000 UNITS tablet, Take 1 tablet by mouth daily., Disp: , Rfl:    ferrous sulfate 325 (65 FE) MG tablet, Take 1 tablet by mouth daily., Disp: , Rfl:    furosemide  (LASIX ) 20 MG tablet, Take 1 tablet (20 mg total) by mouth daily as needed., Disp: 90 tablet, Rfl: 0   gabapentin  (NEURONTIN ) 300 MG capsule, Take 1 capsule (300 mg total) by mouth 3 (three) times daily., Disp: 270 capsule, Rfl: 1   glucose blood (ONETOUCH VERIO) test strip, Use to check blood sugar up to twice daily, Disp: 200 each, Rfl: 3   Lancets (ONETOUCH DELICA PLUS LANCET30G) MISC, Use to check blood sugar up to twice daily,  Disp: 200 each, Rfl: 3   levothyroxine  (SYNTHROID ) 150 MCG tablet, Take 150 mcg by mouth every morning., Disp: , Rfl:    metoprolol  succinate (TOPROL -XL) 50 MG 24 hr tablet, Take 1 tablet (50 mg total) by mouth daily. Take with or immediately following a meal., Disp: 90 tablet, Rfl: 1   MULTIPLE VITAMINS-MINERALS ER PO, Take 1 tablet by mouth daily., Disp: , Rfl:    naftifine  (NAFTIN ) 1 % cream, APPLY  CREAM TOPICALLY ONCE DAILY, Disp: 90 g, Rfl: 0   omega-3 fish oil (MAXEPA) 1000 MG CAPS capsule, Take 1 capsule by mouth daily., Disp: , Rfl:    Polyethylene Glycol 3350  POWD, Take 1 Dose by mouth as needed., Disp: , Rfl:    potassium chloride  SA (KLOR-CON  M) 20 MEQ tablet, Take 1 tablet (20 mEq total) by mouth daily., Disp: 90 tablet, Rfl: 1   Semaglutide , 1 MG/DOSE, 4 MG/3ML SOPN, Inject 1 mg as directed once a week., Disp: 9 mL, Rfl: 0   triamcinolone  cream (KENALOG ) 0.1 %, Apply 1 Application topically 2 (two) times daily. Mix with Eucerin or Nivea lotion ( at home  ),  Disp: 453.6 g, Rfl: 0   atorvastatin  (LIPITOR) 40 MG tablet, Take 1 tablet (40 mg total) by mouth daily., Disp: 90 tablet, Rfl: 0   clopidogrel  (PLAVIX ) 75 MG tablet, Take 1 tablet by mouth once daily, Disp: 90 tablet, Rfl: 3  Allergies  Allergen Reactions   Hydrochlorothiazide     hyperparathyroidism   Pneumococcal Vaccine Rash   Pneumovax [Pneumococcal Polysaccharide Vaccine]     I personally reviewed active problem list, medication list, allergies, family history with the patient/caregiver today.   ROS  Ten systems reviewed and is negative except as mentioned in HPI    Objective Physical Exam VITALS: BP- 116/72 MEASUREMENTS: Weight- 229.5, BMI- 35.94. CONSTITUTIONAL: Patient appears well-developed and well-nourished.  No distress. HEENT: Head atraumatic, normocephalic, neck supple. CARDIOVASCULAR: Normal rate, regular rhythm and normal heart sounds.  No murmur heard. No BLE edema. PULMONARY: Effort normal and breath sounds normal. No respiratory distress. ABDOMINAL: There is no tenderness or distention. MUSCULOSKELETAL: Normal gait. Without gross motor or sensory deficit. PSYCHIATRIC: Patient has a normal mood and affect. behavior is normal. Judgment and thought content normal.  Vitals:   03/20/24 1040  BP: 116/72  Pulse: 97  Resp: 16  SpO2: 97%  Weight: 229 lb 8 oz (104.1 kg)  Height: 5' 7 (1.702 m)    Body mass index is 35.94 kg/m.  Recent Results (from the past 2160 hours)  POCT glycosylated hemoglobin (Hb A1C)     Status: None   Collection Time: 03/20/24 10:44 AM  Result Value Ref Range   Hemoglobin A1C 5.4 4.0 - 5.6 %   HbA1c POC (<> result, manual entry)     HbA1c, POC (prediabetic range)     HbA1c, POC (controlled diabetic range)      Diabetic Foot Exam:     PHQ2/9:    03/20/2024   10:29 AM 11/19/2023    8:44 AM 06/03/2023   11:45 AM 04/23/2023    9:50 AM 10/21/2022    8:01 AM  Depression screen PHQ 2/9  Decreased Interest 0 0 0 0 0  Down,  Depressed, Hopeless 0 0 0 0 0  PHQ - 2 Score 0 0 0 0 0  Altered sleeping  0 0 0 0  Tired, decreased energy  0 0 0 0  Change in appetite  0 0 0 0  Feeling bad  or failure about yourself   0 0 0 0  Trouble concentrating  0 0 0 0  Moving slowly or fidgety/restless  0 0 0 0  Suicidal thoughts  0 0 0 0  PHQ-9 Score  0 0 0 0  Difficult doing work/chores  Not difficult at all  Not difficult at all     phq 9 is negative  Fall Risk:    03/20/2024   10:29 AM 11/19/2023    8:44 AM 06/03/2023   11:53 AM 04/23/2023    9:50 AM 10/21/2022    8:01 AM  Fall Risk   Falls in the past year? 0 0 0 0 0  Number falls in past yr: 0 0  0 0  Injury with Fall? 0 0  0 0  Risk for fall due to : No Fall Risks No Fall Risks No Fall Risks No Fall Risks No Fall Risks  Follow up Falls evaluation completed Falls prevention discussed;Education provided;Falls evaluation completed Falls prevention discussed;Education provided;Falls evaluation completed Falls prevention discussed;Education provided;Falls evaluation completed Falls prevention discussed      Assessment & Plan Primary hyperparathyroidism Monitored by Dr. Cherilyn. PTH improved from 106 to 70. Calcium  slightly elevated at 10.9. - Avoid calcium  supplements.  Hypothyroidism TSH fluctuated, indicating overmedication. Current dose 137 mcg, discrepancy with 150 mcg at home. No hyperthyroid symptoms reported. - Verify current thyroid  medication dose. - Ensure follow-up with Dr. Cherilyn in September. - Monitor for hyperthyroid symptoms.  Type 2 diabetes mellitus with diabetic neuropathy and background retinopathy Diabetes well-controlled, A1c 5.4. Neuropathy managed with gabapentin . Background retinopathy present, no recent eye exam results. - Continue Ozempic  1 mg weekly. - Continue gabapentin  300 mg three times daily. - Ensure regular eye exams.  Dyslipidemia Managed with atorvastatin . LDL controlled at 44, HDL low at 33. Encouraged lifestyle  changes to improve HDL. - Continue atorvastatin . - Increase physical activity. - Consume more fish and tree nuts.  Obesity BMI 35.94. Lost nearly 20 pounds since starting Ozempic . Encouraged increased walking and portion control. - Increase daily walking to 20 minutes. - Practice portion control. - Continue Ozempic .  Nicotine  dependence, cigarettes Smoking one pack per day. Discussed Chantix  and nicotine  patches. Emphasized healthier habits. - Prescribe Chantix , starting with 0.5 mg twice daily, then 1 mg twice daily. - Consider nicotine  patches. - Encourage gradual reduction in cigarette consumption. - Replace smoking with healthier habits.  Atherosclerosis of aorta and carotid arteries Managed with Plavix  and atorvastatin . No stroke or headache symptoms reported. - Continue Plavix . - Continue atorvastatin .  Chronic venous insufficiency and lymphedema of lower extremities Managed with Lasix . Symptoms well-managed. - Continue Lasix  as needed.  Edema of lower extremities Managed with Lasix . Symptoms well-managed. - Continue Lasix  as needed.  Paraseptal emphysema Managed with Breztri . No morning cough, occasional cough when hot. Emphasized smoking cessation. - Continue Breztri . - Encourage smoking cessation.  Hypertension Well-controlled, BP 116/72. - Continue current antihypertensive regimen.  Osteoarthritis, multiple sites Pain in multiple joints, intermittent and migratory. - Use Tylenol  up to 3 grams per day for pain management.

## 2024-03-20 NOTE — Progress Notes (Signed)
 error

## 2024-03-24 ENCOUNTER — Telehealth: Payer: Self-pay

## 2024-03-24 NOTE — Telephone Encounter (Signed)
 Copied from CRM [id number]. Topic: Clinical - Prescription Issue >> Mar 24, 2024  9:22 AM Avram MATSU wrote: Reason for CRM: Patient is calling because her provider prescribed her medication to help her stop smoking and stated the medication is too expensive. She would like something more affordable. Please call if you have any questions [phone] (M)  varenicline  (CHANTIX ) 0.5 MG tablet [[id number]] varenicline  (CHANTIX  CONTINUING MONTH PAK) 1 MG tablet [[id number]]

## 2024-03-30 ENCOUNTER — Other Ambulatory Visit (INDEPENDENT_AMBULATORY_CARE_PROVIDER_SITE_OTHER): Payer: Self-pay | Admitting: Vascular Surgery

## 2024-03-30 DIAGNOSIS — I6523 Occlusion and stenosis of bilateral carotid arteries: Secondary | ICD-10-CM

## 2024-04-04 ENCOUNTER — Ambulatory Visit (INDEPENDENT_AMBULATORY_CARE_PROVIDER_SITE_OTHER): Payer: Medicare (Managed Care) | Admitting: Vascular Surgery

## 2024-04-04 ENCOUNTER — Ambulatory Visit (INDEPENDENT_AMBULATORY_CARE_PROVIDER_SITE_OTHER): Payer: Medicare (Managed Care)

## 2024-04-04 ENCOUNTER — Encounter (INDEPENDENT_AMBULATORY_CARE_PROVIDER_SITE_OTHER): Payer: Self-pay | Admitting: Vascular Surgery

## 2024-04-04 VITALS — BP 106/71 | HR 66 | Ht 67.0 in | Wt 231.0 lb

## 2024-04-04 DIAGNOSIS — I6523 Occlusion and stenosis of bilateral carotid arteries: Secondary | ICD-10-CM

## 2024-04-04 DIAGNOSIS — I10 Essential (primary) hypertension: Secondary | ICD-10-CM | POA: Diagnosis not present

## 2024-04-04 DIAGNOSIS — E785 Hyperlipidemia, unspecified: Secondary | ICD-10-CM

## 2024-04-04 DIAGNOSIS — E1140 Type 2 diabetes mellitus with diabetic neuropathy, unspecified: Secondary | ICD-10-CM | POA: Diagnosis not present

## 2024-04-04 NOTE — Progress Notes (Signed)
 MRN : [id number]  Charlene Hardy is a 68 y.o. (11-02-1955) female who presents with chief complaint of  Chief Complaint  Patient presents with   Carotid    6 month follow up   .  History of Present Illness: Patient returns today in follow up of her carotid disease.  She is doing well and denies any focal neurologic symptoms. Specifically, the patient denies amaurosis fugax, speech or swallowing difficulties, or arm or leg weakness or numbness. Carotid duplex today shows the known occlusion of the right carotid artery.  Her velocities are just into the 40 to 59% range on the left which which may be somewhat falsely elevated due to compensatory flow.  Current Outpatient Medications  Medication Sig Dispense Refill   acetaminophen  (TYLENOL ) 500 MG tablet Take 1 tablet (500 mg total) by mouth 2 (two) times daily. 30 tablet 0   albuterol  (VENTOLIN  HFA) 108 (90 Base) MCG/ACT inhaler INHALE 2 PUFFS BY MOUTH EVERY 6 HOURS AS NEEDED FOR WHEEZING FOR SHORTNESS OF BREATH 9 g 0   amLODipine -benazepril  (LOTREL) 5-20 MG capsule Take 1 capsule by mouth daily. 90 capsule 1   aspirin 81 MG tablet Take 1 tablet by mouth daily.     atorvastatin  (LIPITOR) 40 MG tablet Take 1 tablet (40 mg total) by mouth daily. 90 tablet 1   Blood Glucose Monitoring Suppl (ONETOUCH VERIO FLEX SYSTEM) w/Device KIT Use to check blood sugar up to twice daily as directed 1 kit 0   budeson-glycopyrrolate-formoterol (BREZTRI  AEROSPHERE) 160-9-4.8 MCG/ACT AERO inhaler Inhale 2 puffs into the lungs 2 (two) times daily. 32.1 g 3   Cholecalciferol (VITAMIN D ) 2000 UNITS tablet Take 1 tablet by mouth daily.     clopidogrel  (PLAVIX ) 75 MG tablet Take 1 tablet by mouth once daily 90 tablet 3   ferrous sulfate 325 (65 FE) MG tablet Take 1 tablet by mouth daily.     furosemide  (LASIX ) 20 MG tablet Take 1 tablet (20 mg total) by mouth daily as needed. 90 tablet 1   gabapentin  (NEURONTIN ) 300 MG capsule Take 1 capsule (300 mg total) by mouth  3 (three) times daily. 270 capsule 1   glucose blood (ONETOUCH VERIO) test strip Use to check blood sugar up to twice daily 200 each 3   Lancets (ONETOUCH DELICA PLUS LANCET30G) MISC Use to check blood sugar up to twice daily 200 each 3   levothyroxine  (SYNTHROID ) 150 MCG tablet Take 150 mcg by mouth every morning.     metoprolol  succinate (TOPROL -XL) 50 MG 24 hr tablet Take 1 tablet (50 mg total) by mouth daily. Take with or immediately following a meal. 90 tablet 1   MULTIPLE VITAMINS-MINERALS ER PO Take 1 tablet by mouth daily.     naftifine  (NAFTIN ) 1 % cream APPLY  CREAM TOPICALLY ONCE DAILY 90 g 0   omega-3 fish oil (MAXEPA) 1000 MG CAPS capsule Take 1 capsule by mouth daily.     Polyethylene Glycol 3350  POWD Take 1 Dose by mouth as needed.     potassium chloride  SA (KLOR-CON  M) 20 MEQ tablet Take 1 tablet (20 mEq total) by mouth daily. 90 tablet 1   Semaglutide , 1 MG/DOSE, 4 MG/3ML SOPN Inject 1 mg as directed once a week. 9 mL 0   triamcinolone  cream (KENALOG ) 0.1 % Apply 1 Application topically 2 (two) times daily. Mix with Eucerin or Nivea lotion ( at home  ) 453.6 g 0   varenicline  (CHANTIX  CONTINUING MONTH PAK) 1 MG tablet Take  1 tablet (1 mg total) by mouth 2 (two) times daily. 180 tablet 0   varenicline  (CHANTIX ) 0.5 MG tablet Take 1 tablet (0.5 mg total) by mouth 2 (two) times daily. 60 tablet 0   No current facility-administered medications for this visit.    Past Medical History:  Diagnosis Date   Arthritis    Diabetes mellitus without complication (HCC)    Heart murmur    Hyperlipidemia    Hypertension    Thyroid  disease     Past Surgical History:  Procedure Laterality Date   ABDOMINAL HYSTERECTOMY  08/11/1999   still has ovaries   BREAST BIOPSY Right 09/05/2018   Affirm Bx- Ribbon clip- path pending   CAROTID PTA/STENT INTERVENTION Right 07/01/2023   Procedure: CAROTID PTA/STENT INTERVENTION;  Surgeon: Marea Selinda RAMAN, MD;  Location: [facility];  Service:  Cardiovascular;  Laterality: Right;   COLONOSCOPY  08/11/2015   COLONOSCOPY WITH PROPOFOL  N/A 06/10/2016   Procedure: COLONOSCOPY WITH PROPOFOL ;  Surgeon: Louanne KANDICE Muse, MD;  Location: [facility];  Service: Endoscopy;  Laterality: N/A;   COLONOSCOPY WITH PROPOFOL  N/A 06/11/2021   Procedure: COLONOSCOPY WITH PROPOFOL ;  Surgeon: Dessa Reyes ORN, MD;  Location: [facility];  Service: Endoscopy;  Laterality: N/A;     Social History   Tobacco Use   Smoking status: Every Day    Current packs/day: 1.00    Average packs/day: 1 pack/day for 47.6 years (47.6 ttl pk-yrs)    Types: Cigarettes    Start date: 08/10/1976   Smokeless tobacco: Never   Tobacco comments:    cutting down, smoking half pack lately   Vaping Use   Vaping status: Never Used  Substance Use Topics   Alcohol use: No    Alcohol/week: 0.0 standard drinks of alcohol   Drug use: No      Family History  Problem Relation Age of Onset   Multiple sclerosis Mother    Diabetes Father    Breast cancer Sister 67   Other Sister        DDD - she had to have back surgery   Hypertension Maternal Grandmother    Thyroid  disease Paternal Uncle    Breast cancer Paternal Aunt    Heart Problems Paternal Aunt     Allergies  Allergen Reactions   Hydrochlorothiazide     hyperparathyroidism   Pneumococcal Vaccine Rash   Pneumovax [Pneumococcal Polysaccharide Vaccine]      REVIEW OF SYSTEMS (Negative unless checked)  Constitutional: [] Weight loss  [] Fever  [] Chills Cardiac: [] Chest pain   [] Chest pressure   [] Palpitations   [] Shortness of breath when laying flat   [] Shortness of breath at rest   [] Shortness of breath with exertion. Vascular:  [] Pain in legs with walking   [] Pain in legs at rest   [] Pain in legs when laying flat   [] Claudication   [] Pain in feet when walking  [] Pain in feet at rest  [] Pain in feet when laying flat   [] History of DVT   [] Phlebitis   [] Swelling in legs   [] Varicose veins   [] Non-healing  ulcers Pulmonary:   [] Uses home oxygen   [] Productive cough   [] Hemoptysis   [] Wheeze  [] COPD   [] Asthma Neurologic:  [] Dizziness  [] Blackouts   [] Seizures   [] History of stroke   [] History of TIA  [] Aphasia   [] Temporary blindness   [] Dysphagia   [] Weakness or numbness in arms   [] Weakness or numbness in legs Musculoskeletal:  [x] Arthritis   [] Joint swelling   []   Joint pain   [] Low back pain Hematologic:  [] Easy bruising  [] Easy bleeding   [] Hypercoagulable state   [] Anemic   Gastrointestinal:  [] Blood in stool   [] Vomiting blood  [] Gastroesophageal reflux/heartburn   [] Abdominal pain Genitourinary:  [] Chronic kidney disease   [] Difficult urination  [] Frequent urination  [] Burning with urination   [] Hematuria Skin:  [] Rashes   [] Ulcers   [] Wounds Psychological:  [] History of anxiety   []  History of major depression.  Physical Examination  BP 106/71   Pulse 66   Ht 5' 7 (1.702 m)   Wt 231 lb (104.8 kg)   BMI 36.18 kg/m  Gen:  WD/WN, NAD Head: [place]/AT, No temporalis wasting. Ear/Nose/Throat: Hearing grossly intact, nares w/o erythema or drainage Eyes: Conjunctiva clear. Sclera non-icteric Neck: Supple.  Trachea midline Pulmonary:  Good air movement, no use of accessory muscles.  Cardiac: RRR, no JVD Vascular:  Vessel Right Left  Radial Palpable Palpable           Musculoskeletal: M/S 5/5 throughout.  No deformity or atrophy. No edema. Neurologic: Sensation grossly intact in extremities.  Symmetrical.  Speech is fluent.  Psychiatric: Judgment intact, Mood & affect appropriate for pt's clinical situation. Dermatologic: No rashes or ulcers noted.  No cellulitis or open wounds.      Labs Recent Results (from the past 2160 hours)  POCT glycosylated hemoglobin (Hb A1C)     Status: None   Collection Time: 03/20/24 10:44 AM  Result Value Ref Range   Hemoglobin A1C 5.4 4.0 - 5.6 %   HbA1c POC (<> result, manual entry)     HbA1c, POC (prediabetic range)     HbA1c, POC (controlled  diabetic range)      Radiology No results found.  Assessment/Plan  Essential (primary) hypertension blood pressure control important in reducing the progression of atherosclerotic disease. On appropriate oral medications.   Type 2 diabetes, controlled, with neuropathy (HCC) blood glucose control important in reducing the progression of atherosclerotic disease. Also, involved in wound healing. On appropriate medications.   Dyslipidemia lipid control important in reducing the progression of atherosclerotic disease. Continue statin therapy   Carotid stenosis Carotid duplex today shows the known occlusion of the right carotid artery.  Her velocities are just into the 40 to 59% range on the left which which may be somewhat falsely elevated due to compensatory flow.  No role for intervention.  Continue current medical regimen which includes aspirin and statin agent.  Recheck in 6 months.    Selinda Gu, MD  04/04/2024 12:56 PM    This note was created with Dragon medical transcription system.  Any errors from dictation are purely unintentional

## 2024-04-04 NOTE — Assessment & Plan Note (Signed)
 Carotid duplex today shows the known occlusion of the right carotid artery.  Her velocities are just into the 40 to 59% range on the left which which may be somewhat falsely elevated due to compensatory flow.  No role for intervention.  Continue current medical regimen which includes aspirin and statin agent.  Recheck in 6 months.

## 2024-04-04 NOTE — Assessment & Plan Note (Signed)
 blood pressure control important in reducing the progression of atherosclerotic disease. On appropriate oral medications.

## 2024-04-04 NOTE — Assessment & Plan Note (Signed)
 lipid control important in reducing the progression of atherosclerotic disease. Continue statin therapy

## 2024-04-04 NOTE — Assessment & Plan Note (Signed)
 blood glucose control important in reducing the progression of atherosclerotic disease. Also, involved in wound healing. On appropriate medications.

## 2024-04-27 DIAGNOSIS — E039 Hypothyroidism, unspecified: Secondary | ICD-10-CM | POA: Diagnosis not present

## 2024-04-27 DIAGNOSIS — M8588 Other specified disorders of bone density and structure, other site: Secondary | ICD-10-CM | POA: Diagnosis not present

## 2024-04-27 DIAGNOSIS — E213 Hyperparathyroidism, unspecified: Secondary | ICD-10-CM | POA: Diagnosis not present

## 2024-05-04 DIAGNOSIS — Z794 Long term (current) use of insulin: Secondary | ICD-10-CM | POA: Diagnosis not present

## 2024-05-04 DIAGNOSIS — E119 Type 2 diabetes mellitus without complications: Secondary | ICD-10-CM | POA: Diagnosis not present

## 2024-05-04 DIAGNOSIS — E559 Vitamin D deficiency, unspecified: Secondary | ICD-10-CM | POA: Diagnosis not present

## 2024-05-04 DIAGNOSIS — E039 Hypothyroidism, unspecified: Secondary | ICD-10-CM | POA: Diagnosis not present

## 2024-05-04 DIAGNOSIS — E213 Hyperparathyroidism, unspecified: Secondary | ICD-10-CM | POA: Diagnosis not present

## 2024-05-04 NOTE — Progress Notes (Addendum)
 HPI:  Charlene Hardy is a 68 y.o. female who presents for follow up hypercalcemia due to hyperparathyroidism, hypothyroidism and osteopenia.  I last saw her in 9/24.  At that time, I adjusted her thyroid  hormone several times.   She does not take any calcium  supplements.  She takes a multivitamin, but it has no calcium  in it.  She does take vitamin D  daily, but is unsure of the dose.  She stays hydrated.  She has not had any fractures or kidney stones.  She is concerned about her calcium /parathyroid .  She is currently on 150 mcg LT4 daily. She feels well overall.  She has no anterior neck pain or swelling.  She is concerned about her thyroid .     ROS:  No chest pain.  No shortness of breath.  Medical History: Past Medical History:  Diagnosis Date   Acute rheumatic fever    Arthritis    Bronchitis    Cardiac murmur    Diabetes (CMS/HHS-HCC)    Hyperlipidemia    Hypertension    Hypothyroid    Obese     Surgical History: Past Surgical History:  Procedure Laterality Date   HYSTERECTOMY VAGINAL  2000   COLONOSCOPY  2007   COLONOSCOPY  2017   Dr Dellie   BREAST EXCISIONAL BIOPSY Right 09/05/2018   COLONOSCOPY  06/11/2021   Entire examined colon is normal/PHx CP/Repeat 23yrs/JWB    Social History:  reports that she has been smoking cigarettes. She started smoking about 47 years ago. She has a 23.9 pack-year smoking history. She has never used smokeless tobacco. She reports that she does not drink alcohol and does not use drugs.  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  Family History: family history includes Breast cancer in her paternal aunt and sister; Diabetes in her father and paternal grandmother; High blood pressure (Hypertension) in her maternal grandmother; Multiple sclerosis in her mother; No Known Problems in her brother, brother, maternal grandfather, and paternal grandfather.  Medications: Current Outpatient  Medications  Medication Sig Dispense Refill   acetaminophen  (TYLENOL ) 500 MG tablet Take 500 mg by mouth every 6 (six) hours as needed for Pain.     amLODIPine -benazepril  (LOTREL) 5-40 mg capsule Take 1 capsule by mouth once daily.     aspirin 81 MG EC tablet Take 81 mg by mouth once daily.     atorvastatin  (LIPITOR) 40 MG tablet Take 40 mg by mouth once daily     bisacodyL (DULCOLAX) 5 mg EC tablet Take two tablets morning and two tablets afternoon day prior to Miralax  prep. 4 tablet 0   cholecalciferol (VITAMIN D3) 2,000 unit tablet Take 2,000 Units by mouth once daily.     ferrous sulfate 325 (65 FE) MG tablet Take 325 mg by mouth daily with breakfast.     FUROsemide  (LASIX ) 20 MG tablet Take 20 mg by mouth once daily.     gabapentin  (NEURONTIN ) 300 MG capsule Take 300 mg by mouth 3 (three) times daily.     ipratropium-albuterol  (COMBIVENT  RESPIMAT) 20-100 mcg/actuation inhaler Inhale 1 inhalation into the lungs 4 (four) times daily as needed     levothyroxine  (SYNTHROID ) 150 MCG tablet Take 1 tablet (150 mcg total) by mouth once daily Take on an empty stomach with a glass of water at least 30-60 minutes before breakfast. 90 tablet 4   metoprolol  succinate (TOPROL -XL) 200 MG XL tablet Take 200 mg by mouth once daily  multivitamin tablet Take 1 tablet by mouth once daily.     naftifine  (NAFTIN ) 1 % cream Apply 1 g topically 2 (two) times daily.     omega-3 fatty acids/fish oil 340-1,000 mg capsule Take 1 capsule by mouth once daily.     polyethylene glycol (MIRALAX ) powder Take 17 g by mouth once daily as needed for Constipation Mix in 4-8ounces of fluid prior to taking.     polyethylene glycol (MIRALAX ) powder One bottle for colonoscopy prep. Use as directed. 255 g 0   potassium chloride  (K-DUR,KLOR-CON ) 20 MEQ ER tablet Take 20 mEq by mouth once daily.     semaglutide  (OZEMPIC ) 1 mg/dose (4 mg/3 mL) pen injector Inject 1 mg subcutaneously once a week     TRELEGY  ELLIPTA 100-62.5-25 mcg inhaler Inhale 1 inhalation into the lungs once daily     semaglutide  0.25 mg or 0.5 mg(2 mg/1.5 mL) PnIj Inject 0.5 mg subcutaneously (Patient not taking: Reported on 05/04/2024)     No current facility-administered medications for this visit.    Allergies: Allergies  Allergen Reactions   Hydrochlorothiazide Other (See Comments)   Pneumococcal Vaccine Rash    Physical Exam: Vitals:   05/04/24 0906  BP: 130/82  Pulse: 78  SpO2: 99%  Weight: (!) 103.9 kg (229 lb)  Height: 170.2 cm (5' 7)     Body mass index is 35.87 kg/m. Gen:  WDWN in NAD   Physical exam otherwise deferred due to coronavirus precautions.  Labs: 09/11/15: Bone density with T-scores in the LS = -1.4 (-1.6 in 1/15), left femoral neck = 0 (+0.6 in 1/15), left proximal femur = +0.5 (+0.5 in 1/15), distal radius = +0.2 (+0.3).  Fracture risk scores of 0% and 2.3%. 09/16/15: 24-hour urinary calcium  =198 mg, 24-hour urinary creatinine =1064 mg.  Calcium  = 10.1.  PTH = 42 12/04/15: TSH = 0.49.  Ionized calcium  = 6.1 (4.5-5.6).  Calcium  = 11.0.  K/Cr= 4.5/0.7. 02/27/16: PTH = 53.  Calcium  = 10.6.  Vitamin D  = 44.2.  K/Cr= 4.6/0.7 09/17/13: PTH = 43.  Calcium  = 10.6.  K/Cr= 4.7/0.7.  Vitamin D  = 40 05/28/17: TSH = 31.41.  Calcium  = 10.8.  K/Cr=4.6/0.7 08/24/17: TSH = 7.78.  A1c = 5.9. 10/20/17:  K/Cr=4.6/0.7.  Ca=10.6.  TSH=0.332.  10/27/2017:  BMD with T scores in LS=-1.7 (-1.4 in 1/15), Prox femur=-0.1 (+0.5 in 1/15), Left Femoral Neck=-0.1 (0.0 in 1/15), distal radius=-0.6 (+0.2 in 1/15).  Osteopenia with frax scores 0.1 and 4.4.  11/16/2018: PTH =55.  Ca=10.7.  K/Cr=4.6/0.7.  UDY=9.156.  D =40.8. 07/05/2019: TSH = 0.23.   09/13/2019 (labs she brought from [facility]): Ca = 10.7.  K/Cr = 4.4/0.8.  LFTs nl, except alk phos = 128.  TSH = 5.81.  A1c = 6.1.  10/06/2019: TSH = 3.27.  A1c = 5.7.  03/05/2020: Bone density with T-scores in the lumbar spine = -0.8 (-1.7 in 3/19), left femoral neck = -0.8 (-0.1 in  3/19), left proximal femur = -0.2 (-0.1 in 3/19, distal radius = -0.8 (-0.6 in 3/19).  FRAX scores = 0.4/4.7.  09/03/2020: K/Cr = 4.3/0.7.  TSH = 2.0.   10/16/2020: A1c = 5.6.   12/10/2020: PTH = 69 (15-65).  Calcium  = 10.5.  Vitamin D  = 32.6.  K/Cr= 4.3/0.7  10/21/2021:  PTH = 89 (16-77).  Ca = 10.4.  K/Cr = 4.5/0.69.  LFTs nl.  TSH = 2.14 03/23/2022:  Bone density with T scoers in LS=-0.5 (-0.8 in 7/21), LFN=-1.1 (-0.8 in 7/21),  LPF=-0.8 (-0.2 in 7/21), DR=-1.2.  Frax=0.7/5.4.    09/03/2022:  TSH = 22.62. 10/21/2022:  Ca = 10.4.  PTH = 80 (16-77).  TSH = 12.48.  D = 68 05/06/2023:  TSH=83.892.  08/16/2023: TSH = 16.093. 12/15/2023: TSH = 0.202. 04/27/2024: PTH =62.  Calcium  = 11.2.  K/Cr= 4.7/0.7.  TSH = 0.2. 04/27/2024: Bone density with T-scores in the lumbar spine = -0.5 (-0.5 in 8/23), left femoral neck = -0.9 (-1.1 in 8/23), left proximal femur = -1.3 (-0.8 in 8/23), distal radius = -2.0 (-1.2 in 8/23).  FRAX = 0.7/5.5.  Osteopenia with interval decrease in bone intensity  Assessment/Plan: 1.  Hyperparathyroidism.  We have previously reviewed hyperparathyroidism, including indications for surgery.  She has not met any indications for surgery.  In 9/25 her PTH was nl but her Ca was upper nl.  I will just continue to follow.    2.  Hypothyroidism.  Her TSH has been elevated in the past.  Her TSH in 9/25 was suppressed on 150 mcg LT4 daily. Therefore, I will have her take a whole pill of 150 mcg of LT4 Monday-Saturday with a half tab on Sundays.    3.  Osteopenia.  Her BMD from 9/25 showed worsening as compared to 8/23 but she is still in the osteopenia range with low risk of frx.  I will continue to follow.  Will consider further work up for parathyroidectomy. I encouraged her to start exercising and to remain off Ca supplements.   4. Obesity/Diabetes. Per PCP.  Her PCP has her on Ozempic  and she has lost 42 pounds since last visit.  5.  She will return to clinic in 12 months.  She has labs  scheduled with her PCP in December. I requested that she have her TSH checked, and I will review the results to adjust her dose as necessary. She will also have labs ahead of next visit with me.   07/19/24:  TSH=6.37.  Will have nurse confirm hasn't missed any.  If not, go back to whole pill daily.  This note is partially prepared by Earla Daria Messier, Scribe, in the presence of and acting as the scribe of Dr. Debby Breaker , MD.    [facility], MD

## 2024-05-09 ENCOUNTER — Telehealth: Payer: Self-pay

## 2024-05-09 NOTE — Telephone Encounter (Signed)
 Gave pt a call to let her know re-enrollment is  starting next month,pt ask to mail out AZ&ME(Breztri ) PAP and to go ahead and do Novo Nordisk when reenrollemt is due.

## 2024-05-11 DIAGNOSIS — Z0100 Encounter for examination of eyes and vision without abnormal findings: Secondary | ICD-10-CM | POA: Diagnosis not present

## 2024-05-11 DIAGNOSIS — H5203 Hypermetropia, bilateral: Secondary | ICD-10-CM | POA: Diagnosis not present

## 2024-05-12 ENCOUNTER — Other Ambulatory Visit: Payer: Self-pay | Admitting: Pharmacist

## 2024-05-12 DIAGNOSIS — F17210 Nicotine dependence, cigarettes, uncomplicated: Secondary | ICD-10-CM

## 2024-05-12 DIAGNOSIS — E1140 Type 2 diabetes mellitus with diabetic neuropathy, unspecified: Secondary | ICD-10-CM

## 2024-05-12 NOTE — Progress Notes (Signed)
   05/12/2024  Patient ID: Charlene Hardy, female   DOB: 03/30/1956, 68 y.o.   MRN: [id number]  Patient enrolled in Ozempic  patient assistance program from [employer] through 08/09/2024  [employer]ovo [employer] has announced that they will no longer offer Ozempic  to Medicare beneficiaries through their Patient Assistance Program in 2026.   Outreach to patient today and provide this update.   Based on reported income, patient believes that she may now meet income requirement for Extra Help subsidy from Social Security - Patient does not have necessary financial information with her today; will plan to complete Extra Help application next week  Counsel patient that Medicare's Annual Enrollment Period for 2026 starts 05/24/2024. Encourage patient to review deductibles formulary coverage and copay amounts (including for Ozempic ) between plans. Also share with patient the contact information for the [facility] Information Program ([place] [facility]) that can help with reviewing these Medicare plans   Today patient also asks about alternative options to varenicline  (Chantix ) as this is not affordable for her through her Medicare insurance plan.  - From review of [employer] Medicare website, varenicline  is a tier 4 medication through patient's Medicare plan - Patient is interested in trying nicotine  patches instead. Discuss that these may be purchased over the counter, including by using over the counter benefit  Tobacco Abuse:  Currently smoking ~ 1 pack/day  Motivation: concern for impact on her health - impact on veins, lungs (COPD) and rest of her body  Barriers: breaking habits; smoking after meal  Strategies: doing crossword puzzles, snacking on carrots    - Provided motivational interviewing to assess tobacco use and strategies for reduction - Start nicotine  patch 21 mg daily. Counseled on proper placement and potential side effects, including mild itching/redness at the location site,  headache, trouble sleeping and/or vivid dreams. Advised to remove patch at night if development of trouble sleeping.  Patch Schedule for >10 cigarettes daily: Apply one 21 mg patch daily for 6 weeks. Then, reduce to one 14 mg patch daily for another 2 weeks, if able. Then, reduce to one 7 mg patch for another 2 weeks, if able.  - May also consider using nicotine  gum 4 mg as needed. Counseled on chew and park method and potential side effects, including nausea, hiccups, cough, and heartburn. Advised to minimize the amount of nicotine  swallowed to reduce incidence of GI side effects   Follow Up Plan: Clinical Pharmacist will follow up with patient by telephone on 05/17/2024 at 3:00 PM    Sharyle Sia, PharmD, [facility] [phone]

## 2024-05-12 NOTE — Patient Instructions (Signed)
 Novo Nordisk, the maker of Ozempic  and Rybelsus , has announced that they will no longer offer these medications to Medicare beneficiaries through their Patient Assistance Program in 2026.   This means that if you currently receive Ozempic  or Rybelsus  at no cost through the program, starting in January 2026 you'll need to use your insurance to continue on these medicines.    Choosing a Medicare Plan  With Medicare's Annual Enrollment Period starting on October 15th, we encourage you to review formulary coverage and copay amounts for Ozempic  or Rybelsus , as costs can vary widely between plans. Starting on Wednesday, October 1st you can compare your Medicare plan options by going to the Plan Finder tool at [employer].gov ( TeleconferenceOnDemand.fr ).   There are Medicare Specialists through the [employer] [facility] Insurance Information Program ([place]) that can help you shop for Medicare plans.   [place] [facility] toll free number: [phone] (Monday - Friday 8 am - 5 pm)  There are representatives located in each county:   Ignacio [facility] [address]     [place]  [address] [phone] Call for an appointment with [facility]      Maximum Out-of-Pocket and Prescription Payment Plan  In 2026, the maximum yearly out-of-pocket cost for medications is $2,100 for all Medicare beneficiaries. This means you will not have to pay more than $2,100 in total for all prescription medications filled on your Medicare plan in 2026. You may also choose to enroll in a Medicare Prescription Payment Plan through your chosen 2026 Medicare plan. This lets you spread out your prescription drug costs over the year instead of laying a large amount all at once at the pharmacy.   Medicare Extra Help  Some patients may qualify for a program called Medicare Extra Help that could help lower the cost of prescriptions on your chosen Medicare plan. If you meet the below income  criteria, please visit [email or web address] to apply, or respond to this message and one of our pharmacy team members can call you to help apply.    Your Household Size Annual income limit Resource Limit*  Individual [id number] $17,600  Married Couple [id number] $35,130   *Resources include:   -  Money in a checking, savings, or retirement account   -  Stocks   -  Bonds  Medicare Does NOT consider the following in your resource limit:   -  Your home   -  One car   -  Burial plot   -  Up to $1,500 for burial expenses if you have put that money aside   -  Furniture   Sharyle Sia, PharmD, [facility] [phone]

## 2024-05-17 ENCOUNTER — Other Ambulatory Visit: Payer: Medicare (Managed Care) | Admitting: Pharmacist

## 2024-05-17 DIAGNOSIS — E1140 Type 2 diabetes mellitus with diabetic neuropathy, unspecified: Secondary | ICD-10-CM

## 2024-05-17 NOTE — Progress Notes (Signed)
   05/17/2024  Patient ID: Charlene Hardy, female   DOB: [birth year], 68 y.o.   MRN: [id number]  Outreach to patient today as requested to assist with completing Extra Help subsidy application. - Assist patient with completing this application online today as requested  Counsel to expect response regarding approval or denial from Social Security via mail within the next 4 to 6 weeks after submitting application online. Advise that even if Charlene Hardy is denied, to retain denial letter.  Reports that she contacted [facility] Information Program and has an appointment scheduled with [facility] for 06/07/2024  Follow Up Plan: Schedule follow up for patient to speak with Clinical Pharmacist Peyton Ferries by telephone on 06/26/2024 at 10:00 AM       Sharyle Sia, PharmD, [facility] [phone]

## 2024-05-17 NOTE — Patient Instructions (Signed)
 Please watch for a response from Social Security by mail within the next 4 to 6 weeks to let you know if this application is approved or denied. For either outcome, please keep this letter for your records.  Thank you!  Sharyle Sia, PharmD, [facility] [phone]

## 2024-05-29 ENCOUNTER — Other Ambulatory Visit (HOSPITAL_COMMUNITY): Payer: Self-pay

## 2024-05-29 NOTE — Telephone Encounter (Signed)
 Received pt portion pap AZ&ME Doreen today

## 2024-05-31 NOTE — Telephone Encounter (Signed)
 Pt call back pt has been pre-approved for re-enrollment on AZ&ME Farxiga,pt call to get AZ&ME number and will be calling AZ&ME today.

## 2024-06-02 NOTE — Telephone Encounter (Signed)
 Mrs. Lepp call back ,she call AZ&ME to give consent to AZ&ME Doreen she has been approved thru 08/09/2025.

## 2024-06-05 NOTE — Telephone Encounter (Signed)
 Approval letter received via Onbase, uploaded to pt's media.

## 2024-06-21 ENCOUNTER — Ambulatory Visit: Payer: Medicare (Managed Care) | Attending: Cardiology | Admitting: Cardiology

## 2024-06-21 ENCOUNTER — Encounter: Payer: Self-pay | Admitting: Cardiology

## 2024-06-21 VITALS — BP 144/76 | HR 73 | Resp 98 | Ht 67.0 in | Wt 228.6 lb

## 2024-06-21 DIAGNOSIS — I499 Cardiac arrhythmia, unspecified: Secondary | ICD-10-CM

## 2024-06-21 DIAGNOSIS — I10 Essential (primary) hypertension: Secondary | ICD-10-CM

## 2024-06-21 DIAGNOSIS — F17200 Nicotine dependence, unspecified, uncomplicated: Secondary | ICD-10-CM | POA: Diagnosis not present

## 2024-06-21 NOTE — Progress Notes (Signed)
 Cardiology Office Note:    Date:  06/21/2024   ID:  Lenward Bull, DOB 17-Feb-1956, MRN [id number]  PCP:  Sowles, Krichna, MD  [facility] Cardiologist:  None  [facility] Electrophysiologist:  None   Referring MD: Glenard Mire, MD    Chief Complaint  Patient presents with   Follow-up    12 month follow up visit. Patient is doing well on today. Meds reviewed.     History of Present Illness:    Charlene Hardy is a 68 y.o. female with a hx of hypertension, hyperlipidemia, morbid obesity, moderate left carotid artery stenosis, current smoker x40+ years who presents for follow-up.    Doing okay, denies chest pain or shortness of breath, denies palpitations.  BP usually well-controlled at home, has not taken BP meds today.  Taking Ozempic  which has helped with weight loss.  Also trying to quit smoking.  Feels well, has no concerns today.  Prior notes echocardiogram on 04/2018 showed normal systolic function, EF 55 to 60%, impaired relaxation, mild MR, mild LVH.    Past Medical History:  Diagnosis Date   Arthritis    Diabetes mellitus without complication (HCC)    Heart murmur    Hyperlipidemia    Hypertension    Thyroid  disease     Past Surgical History:  Procedure Laterality Date   ABDOMINAL HYSTERECTOMY  08/11/1999   still has ovaries   BREAST BIOPSY Right 09/05/2018   Affirm Bx- Ribbon clip- path pending   CAROTID PTA/STENT INTERVENTION Right 07/01/2023   Procedure: CAROTID PTA/STENT INTERVENTION;  Surgeon: Marea Selinda RAMAN, MD;  Location: [facility] INVASIVE CV LAB;  Service: Cardiovascular;  Laterality: Right;   COLONOSCOPY  08/11/2015   COLONOSCOPY WITH PROPOFOL  N/A 06/10/2016   Procedure: COLONOSCOPY WITH PROPOFOL ;  Surgeon: Louanne KANDICE Muse, MD;  Location: [facility] ENDOSCOPY;  Service: Endoscopy;  Laterality: N/A;   COLONOSCOPY WITH PROPOFOL  N/A 06/11/2021   Procedure: COLONOSCOPY WITH PROPOFOL ;  Surgeon: Dessa Reyes ORN, MD;  Location: [facility] ENDOSCOPY;  Service:  Endoscopy;  Laterality: N/A;    Current Medications: Current Meds  Medication Sig   acetaminophen  (TYLENOL ) 500 MG tablet Take 1 tablet (500 mg total) by mouth 2 (two) times daily.   albuterol  (VENTOLIN  HFA) 108 (90 Base) MCG/ACT inhaler INHALE 2 PUFFS BY MOUTH EVERY 6 HOURS AS NEEDED FOR WHEEZING FOR SHORTNESS OF BREATH   amLODipine -benazepril  (LOTREL) 5-20 MG capsule Take 1 capsule by mouth daily.   aspirin 81 MG tablet Take 1 tablet by mouth daily.   atorvastatin  (LIPITOR) 40 MG tablet Take 1 tablet (40 mg total) by mouth daily.   Blood Glucose Monitoring Suppl (ONETOUCH VERIO FLEX SYSTEM) w/Device KIT Use to check blood sugar up to twice daily as directed   budeson-glycopyrrolate-formoterol (BREZTRI  AEROSPHERE) 160-9-4.8 MCG/ACT AERO inhaler Inhale 2 puffs into the lungs 2 (two) times daily.   Cholecalciferol (VITAMIN D ) 2000 UNITS tablet Take 1 tablet by mouth daily.   clopidogrel  (PLAVIX ) 75 MG tablet Take 1 tablet by mouth once daily   ferrous sulfate 325 (65 FE) MG tablet Take 1 tablet by mouth daily.   furosemide  (LASIX ) 20 MG tablet Take 1 tablet (20 mg total) by mouth daily as needed.   gabapentin  (NEURONTIN ) 300 MG capsule Take 1 capsule (300 mg total) by mouth 3 (three) times daily.   glucose blood (ONETOUCH VERIO) test strip Use to check blood sugar up to twice daily   Lancets (ONETOUCH DELICA PLUS LANCET30G) MISC Use to check blood sugar up to twice daily  levothyroxine  (SYNTHROID ) 150 MCG tablet Take 150 mcg by mouth every morning.   metoprolol  succinate (TOPROL -XL) 50 MG 24 hr tablet Take 1 tablet (50 mg total) by mouth daily. Take with or immediately following a meal.   MULTIPLE VITAMINS-MINERALS ER PO Take 1 tablet by mouth daily.   naftifine  (NAFTIN ) 1 % cream APPLY  CREAM TOPICALLY ONCE DAILY   omega-3 fish oil (MAXEPA) 1000 MG CAPS capsule Take 1 capsule by mouth daily.   Polyethylene Glycol 3350  POWD Take 1 Dose by mouth as needed.   potassium chloride  SA (KLOR-CON   M) 20 MEQ tablet Take 1 tablet (20 mEq total) by mouth daily.   Semaglutide , 1 MG/DOSE, 4 MG/3ML SOPN Inject 1 mg as directed once a week.   triamcinolone  cream (KENALOG ) 0.1 % Apply 1 Application topically 2 (two) times daily. Mix with Eucerin or Nivea lotion ( at home  )     Allergies:   Hydrochlorothiazide, Pneumococcal vaccine, and Pneumovax [pneumococcal polysaccharide vaccine]   Social History   Socioeconomic History   Marital status: Single    Spouse name: Not on file   Number of children: 0   Years of education: Not on file   Highest education level: High school graduate  Occupational History   Not on file  Tobacco Use   Smoking status: Every Day    Current packs/day: 1.00    Average packs/day: 1 pack/day for 47.9 years (47.9 ttl pk-yrs)    Types: Cigarettes    Start date: 08/10/1976   Smokeless tobacco: Never   Tobacco comments:    cutting down, smoking half pack lately   Vaping Use   Vaping status: Never Used  Substance and Sexual Activity   Alcohol use: No    Alcohol/week: 0.0 standard drinks of alcohol   Drug use: No   Sexual activity: Not Currently  Other Topics Concern   Not on file  Social History Narrative   Not on file   Social Drivers of Health   Financial Resource Strain: Low Risk  (06/03/2023)   Overall Financial Resource Strain (CARDIA)    Difficulty of Paying Living Expenses: Not hard at all  Food Insecurity: No Food Insecurity (06/03/2023)   Hunger Vital Sign    Worried About Running Out of Food in the Last Year: Never true    Ran Out of Food in the Last Year: Never true  Transportation Needs: No Transportation Needs (06/03/2023)   PRAPARE - Administrator, Civil Service (Medical): No    Lack of Transportation (Non-Medical): No  Physical Activity: Inactive (06/03/2023)   Exercise Vital Sign    Days of Exercise per Week: 0 days    Minutes of Exercise per Session: 0 min  Stress: No Stress Concern Present (06/03/2023)   [employer] - Occupational Stress Questionnaire    Feeling of Stress : Only a little  Social Connections: Moderately Isolated (06/03/2023)   Social Connection and Isolation Panel    Frequency of Communication with Friends and Family: More than three times a week    Frequency of Social Gatherings with Friends and Family: Once a week    Attends Religious Services: More than 4 times per year    Active Member of [employer] or Organizations: No    Attends Banker Meetings: Never    Marital Status: Never married     Family History: The patient's family history includes Breast cancer in her paternal aunt; Breast cancer (age of onset: 47)  in her sister; Diabetes in her father; Heart Problems in her paternal aunt; Hypertension in her maternal grandmother; Multiple sclerosis in her mother; Other in her sister; Thyroid  disease in her paternal uncle.  ROS:   Please see the history of present illness.     All other systems reviewed and are negative.  EKGs/Labs/Other Studies Reviewed:    The following studies were reviewed today:   EKG Interpretation Date/Time:  Wednesday June 21 2024 09:01:01 EST Ventricular Rate:  73 PR Interval:  142 QRS Duration:  76 QT Interval:  366 QTC Calculation: 403 R Axis:   -2  Text Interpretation: Normal sinus rhythm Cannot rule out Anterior infarct Confirmed by Darliss Rogue ([id number]) on 06/21/2024 9:16:14 AM    Recent Labs: 11/19/2023: ALT 7; BUN 9; Creat 0.69; Hemoglobin 13.4; Platelets 297; Potassium 4.4; Sodium 138; TSH 0.71  Recent Lipid Panel    Component Value Date/Time   CHOL 97 11/19/2023 0958   CHOL 150 04/25/2015 0857   TRIG 118 11/19/2023 0958   HDL 33 (L) 11/19/2023 0958   HDL 41 04/25/2015 0857   CHOLHDL 2.9 11/19/2023 0958   VLDL 26 11/02/2016 1013   LDLCALC 44 11/19/2023 0958    Physical Exam:    VS:  BP (!) 144/76   Pulse 73   Resp (!) 98   Ht 5' 7 (1.702 m)   Wt 228 lb 9.6 oz (103.7 kg)    BMI 35.80 kg/m     Wt Readings from Last 3 Encounters:  06/21/24 228 lb 9.6 oz (103.7 kg)  04/04/24 231 lb (104.8 kg)  03/20/24 229 lb 8 oz (104.1 kg)     GEN:  Well nourished, well developed in no acute distress HEENT: Normal NECK: No JVD; No carotid bruits CARDIAC: Regular rate and rhythm RESPIRATORY: Rhonchorous breath sounds bilaterally ABDOMEN: Soft, non-tender, non-distended MUSCULOSKELETAL:  No edema; No deformity  SKIN: Warm and dry NEUROLOGIC:  Alert and oriented x 3 PSYCHIATRIC:  Normal affect   ASSESSMENT:    1. Irregular heart beats   2. Primary hypertension   3. Current smoker    PLAN:    In order of problems listed above:  Irregular heartbeat  occasional PACs on EKG, symptoms controlled with beta-blocker. continue Toprol -XL 50 mg daily. Hypertension, BP elevated today, usually controlled.  Has not taken BP meds this a.m.  Continue amlodipine -benazepril  5-20 mg daily, Toprol  XL 50 mg daily Current smoker, smoking again cessation..  Follow-up as needed.   Medication Adjustments/Labs and Tests Ordered: Current medicines are reviewed at length with the patient today.  Concerns regarding medicines are outlined above.  Orders Placed This Encounter  Procedures   EKG 12-Lead   No orders of the defined types were placed in this encounter.   Patient Instructions  Medication Instructions:  Your physician recommends that you continue on your current medications as directed. Please refer to the Current Medication list given to you today.   *If you need a refill on your cardiac medications before your next appointment, please call your pharmacy*  Lab Work: No labs ordered today  If you have labs (blood work) drawn today and your tests are completely normal, you will receive your results only by: MyChart Message (if you have MyChart) OR A paper copy in the mail If you have any lab test that is abnormal or we need to change your treatment, we will call you to  review the results.  Testing/Procedures: No test ordered today   Follow-Up: At [facility], you and your health needs are our priority.  As part of our continuing mission to provide you with exceptional heart care, our providers are all part of one team.  This team includes your primary Cardiologist (physician) and Advanced Practice Providers or APPs (Physician Assistants and Nurse Practitioners) who all work together to provide you with the care you need, when you need it.  Your next appointment:   As needed            Signed, Redell Cave, MD  06/21/2024 10:02 AM    [facility]

## 2024-06-21 NOTE — Patient Instructions (Signed)

## 2024-06-22 NOTE — Progress Notes (Signed)
 Pharmacy Quality Measure Review  This patient is appearing on a report for being at risk of failing the adherence measure for cholesterol (statin) and hypertension (ACEi/ARB) medications this calendar year.   Medication: atorvastatin  Last fill date: 02/24/24 for 90 day supply  Medication: amlodipine /benazepril   Last fill date: 02/24/24 for 90 day supply  Contacted pharmacy to facilitate refills.  Hayli Milligan E. Marsh, PharmD, CPP Clinical Pharmacist [facility] [phone]

## 2024-06-26 ENCOUNTER — Other Ambulatory Visit (HOSPITAL_COMMUNITY): Payer: Self-pay

## 2024-06-26 ENCOUNTER — Ambulatory Visit (INDEPENDENT_AMBULATORY_CARE_PROVIDER_SITE_OTHER): Payer: Medicare (Managed Care)

## 2024-06-26 DIAGNOSIS — E1140 Type 2 diabetes mellitus with diabetic neuropathy, unspecified: Secondary | ICD-10-CM | POA: Diagnosis not present

## 2024-06-26 DIAGNOSIS — Z7985 Long-term (current) use of injectable non-insulin antidiabetic drugs: Secondary | ICD-10-CM

## 2024-06-26 NOTE — Progress Notes (Signed)
 S:     Reason for visit: ?  Charlene Hardy is a 68 y.o. female with a history of diabetes (type 2), who presents today for a follow up diabetes Telephone pharmacotherapy visit.? Pertinent PMH also includes HTN, HLD, obesity, hyperparathyroidism, osteopenia.   Care Team: Primary Care Provider: Sowles, Krichna, MD  Patient was enrolled in Medicare Extra Help on 05/17/24. She reports she has not yet received a letter in the mail regarding the status of this application.   Current diabetes medications include: Ozempic  1 mg weekly Previous diabetes medications include: metformin , Trulicity  Current hypertension medications include: amlodipine /benazepril  5/20 mg daily, metoprolol  succinate 50 mg daily, furosemide  20 mg daily prn Current hyperlipidemia medications include: atorvastatin  40 mg daily  Patient reports adherence to taking all medications as prescribed.   Have you been experiencing any side effects to the medications prescribed? no Do you have any problems obtaining medications due to transportation or finances? no Insurance coverage: [employer] Medicare  Current medication access support:  Breztri  via [place] until 08/09/25  Patient denies hypoglycemic events.  Reported home fasting blood sugars: not checking   Tobacco Abuse:  Tobacco Use History: Number of cigarettes per day 1.5 ppd Does wake at night to smoke Triggers include boredom   Patient reports Chantix  was too expensive on insurance. She would like ot discuss smoking cessation after the new year DM Prevention:  Statin: Taking; moderate intensity.?  ACE/ARB: yes; benazepril  Last urinary albumin/creatinine ratio:  Lab Results  Component Value Date   MICRALBCREAT 8 11/19/2023   MICRALBCREAT 2 10/21/2022   MICRALBCREAT 104 (H) 10/21/2021   MICRALBCREAT NOTE 02/24/2018   Last eye exam:  Lab Results  Component Value Date   HMDIABEYEEXA No Retinopathy 01/28/2021   Lab Results  Component Value Date    HMDIABEYEEXA No Retinopathy 01/28/2021   Last foot exam: No foot exam found Tobacco Use:  Tobacco Use: High Risk (06/21/2024)   Patient History    Smoking Tobacco Use: Every Day    Smokeless Tobacco Use: Never    Passive Exposure: Not on file   O:  Vitals:  Wt Readings from Last 3 Encounters:  06/21/24 228 lb 9.6 oz (103.7 kg)  04/04/24 231 lb (104.8 kg)  03/20/24 229 lb 8 oz (104.1 kg)   BP Readings from Last 3 Encounters:  06/21/24 (!) 144/76  04/04/24 106/71  03/20/24 116/72   Pulse Readings from Last 3 Encounters:  06/21/24 73  04/04/24 66  03/20/24 97     Labs:?  Lab Results  Component Value Date   HGBA1C 5.4 03/20/2024   HGBA1C 5.4 11/19/2023   HGBA1C 6.1 (A) 04/23/2023   GLUCOSE 69 11/19/2023   MICRALBCREAT 8 11/19/2023   MICRALBCREAT 2 10/21/2022   MICRALBCREAT 104 (H) 10/21/2021   CREATININE 0.69 11/19/2023   CREATININE 0.81 07/01/2023   CREATININE 0.80 06/02/2023    Lab Results  Component Value Date   CHOL 97 11/19/2023   LDLCALC 44 11/19/2023   LDLCALC 78 09/03/2022   LDLCALC 50 10/21/2021   HDL 33 (L) 11/19/2023   TRIG 118 11/19/2023   TRIG 122 09/03/2022   TRIG 128 10/21/2021   ALT 7 11/19/2023   ALT 7 09/03/2022   AST 10 11/19/2023   AST 10 (A) 09/03/2022      Chemistry      Component Value Date/Time   NA 138 11/19/2023 0958   NA 139 09/03/2022 0000   NA 133 (L) 08/18/2014 1941   K 4.4 11/19/2023 9041  K 3.9 08/18/2014 1941   CL 104 11/19/2023 0958   CL 100 08/18/2014 1941   CO2 31 11/19/2023 0958   CO2 28 08/18/2014 1941   BUN 9 11/19/2023 0958   BUN 10 09/03/2022 0000   BUN 9 08/18/2014 1941   CREATININE 0.69 11/19/2023 0958   GLU 80 09/03/2022 0000      Component Value Date/Time   CALCIUM  10.9 (H) 11/19/2023 0958   CALCIUM  9.7 08/18/2014 1941   ALKPHOS 112 09/03/2022 0000   ALKPHOS 105 08/18/2014 1941   AST 10 11/19/2023 0958   AST 11 (L) 08/18/2014 1941   ALT 7 11/19/2023 0958   ALT 18 08/18/2014 1941   BILITOT  0.4 11/19/2023 0958   BILITOT 0.3 04/25/2015 0857   BILITOT 0.4 08/18/2014 1941       The ASCVD Risk score (Arnett DK, et al., 2019) failed to calculate for the following reasons:   The valid total cholesterol range is 130 to 320 mg/dL  Lab Results  Component Value Date   MICRALBCREAT 8 11/19/2023   MICRALBCREAT 2 10/21/2022   MICRALBCREAT 104 (H) 10/21/2021   MICRALBCREAT NOTE 02/24/2018    A/P: Diabetes currently controlled with a most recent A1c of 5.4% on 03/20/24. Patient is able to verbalize appropriate hypoglycemia management plan. Medication adherence appears appropriate. Currently awaiting letter regarding Medicare Extra Help approval/denial. May have to discuss alternate therapy in the new year if patient gets denied for LIS. Patient has ~3 months of Ozempic  at home currently.  -Continued GLP-1 Ozempic  (semaglutide ) 1 mg weekly  -Extensively discussed pathophysiology of diabetes, recommended lifestyle interventions, dietary effects on blood sugar control.  -Counseled on s/sx of and management of hypoglycemia.  -Provided contact information for SSI that assists with Medicare Extra Help to check on status of enrollment  ASCVD risk - secondary prevention in patient with diabetes. Last LDL is 44 mg/dL, at goal of <29 mg/dL.  -Continued atorvastatin  40 mg daily.   Tobacco Abuse - Currently uncontrolled at 1.5 ppd. Reports cost barriers to Chantix , but would like to hold off on tobacco cessation until January.  - Provided motivational interviewing to assess tobacco use and strategies for reduction - Discussed reducing tobacco use by 2 cigarettes every couple days.    Patient verbalized understanding of treatment plan. Total time patient counseling 45 minutes.  Follow-up:  Pharmacist on 08/22/24 PCP clinic visit on 07/19/24  Peyton CHARLENA Ferries, PharmD, CPP Clinical Pharmacist [facility] [phone]

## 2024-06-26 NOTE — Telephone Encounter (Signed)
 Correction pt has been approved for Breztri  [place]

## 2024-07-19 ENCOUNTER — Encounter: Payer: Self-pay | Admitting: Family Medicine

## 2024-07-19 ENCOUNTER — Other Ambulatory Visit: Payer: Self-pay

## 2024-07-19 ENCOUNTER — Ambulatory Visit: Payer: Medicare (Managed Care) | Admitting: Family Medicine

## 2024-07-19 VITALS — BP 130/80 | HR 68 | Resp 16 | Ht 67.0 in | Wt 226.0 lb

## 2024-07-19 DIAGNOSIS — E039 Hypothyroidism, unspecified: Secondary | ICD-10-CM

## 2024-07-19 DIAGNOSIS — Z23 Encounter for immunization: Secondary | ICD-10-CM

## 2024-07-19 DIAGNOSIS — J438 Other emphysema: Secondary | ICD-10-CM

## 2024-07-19 DIAGNOSIS — E210 Primary hyperparathyroidism: Secondary | ICD-10-CM

## 2024-07-19 DIAGNOSIS — J410 Simple chronic bronchitis: Secondary | ICD-10-CM

## 2024-07-19 DIAGNOSIS — M25562 Pain in left knee: Secondary | ICD-10-CM

## 2024-07-19 DIAGNOSIS — M25561 Pain in right knee: Secondary | ICD-10-CM

## 2024-07-19 DIAGNOSIS — G8929 Other chronic pain: Secondary | ICD-10-CM

## 2024-07-19 DIAGNOSIS — Z720 Tobacco use: Secondary | ICD-10-CM

## 2024-07-19 DIAGNOSIS — I152 Hypertension secondary to endocrine disorders: Secondary | ICD-10-CM

## 2024-07-19 DIAGNOSIS — F17210 Nicotine dependence, cigarettes, uncomplicated: Secondary | ICD-10-CM

## 2024-07-19 DIAGNOSIS — E1140 Type 2 diabetes mellitus with diabetic neuropathy, unspecified: Secondary | ICD-10-CM

## 2024-07-19 DIAGNOSIS — E1159 Type 2 diabetes mellitus with other circulatory complications: Secondary | ICD-10-CM | POA: Diagnosis not present

## 2024-07-19 DIAGNOSIS — Z122 Encounter for screening for malignant neoplasm of respiratory organs: Secondary | ICD-10-CM

## 2024-07-19 DIAGNOSIS — Z87891 Personal history of nicotine dependence: Secondary | ICD-10-CM

## 2024-07-19 LAB — POCT GLYCOSYLATED HEMOGLOBIN (HGB A1C): Hemoglobin A1C: 4.8 % (ref 4.0–5.6)

## 2024-07-19 NOTE — Progress Notes (Signed)
 Name: Charlene Hardy   MRN: [id number]    DOB: 01/17/1956   Date:07/19/2024       Progress Note  Subjective  Chief Complaint  Chief Complaint  Patient presents with   Medical Management of Chronic Issues   Discussed the use of AI scribe software for clinical note transcription with the patient, who gave verbal consent to proceed.  History of Present Illness Charlene Hardy is a 68 year old female with type 2 diabetes and morbid obesity who presents for a regular follow-up visit.  She has experienced significant weight loss of 71 pounds, attributed to the use of Ozempic , which she takes at a dose of 1 mg weekly, weight is now stabilizing with only a few pounds of weight loss since last visit with me in August. She reports no symptoms of hypoglycemia such as shakiness or excessive hunger.  She has diabetic neuropathy, managed with gabapentin  300 mg four times a day. Her symptoms have improved, with pain occurring only when her feet get cold. Her dry skin has also improved, and there is no maceration between her toes.  She has a history of diabetic retinopathy but is unsure of the current status as she awaits the report from her recent eye exam. She does not regularly check her blood sugar at home but has no symptoms of hypoglycemia.  She has hypothyroidism and takes levothyroxine  150 mcg daily, with a modified dose on Sundays. She is monitored for hyperparathyroidism and osteopenia by Endo, avoiding calcium  supplements. Per patient Dr. Cherilyn recently adjusted dose of her medications and asked me to repeat her TSH today   She has paraseptal emphysema and simple chronic bronchitis, using Breztri  and albuterol  inhalers. No current breathing issues such as shortness of breath or wheezing. She smokes about a pack and a half of cigarettes daily but plans to quit on January 1st 2026. She could not afford Chantix  and is thinking about getting nicotine  lozenges versus otc patches  She has  hypertension associated with diabetes, managed with metoprolol  50 mg, amlodipine , and benazepril  5/20 mg. She also takes Lasix  for fluid management. No chest pain or palpitations.  She has moderate left carotid artery stenosis and is on aspirin, a statin, and Plavix  for management. No recent chest pain or palpitations.  She experiences chronic bilateral knee pain attributed to arthritis. The pain moves from one knee to the other and is not associated with swelling.    Patient Active Problem List   Diagnosis Date Noted   Paraseptal emphysema (HCC) 11/19/2023   Bilateral edema of lower extremity 11/19/2023   Type 2 diabetes, controlled, with neuropathy (HCC) 08/02/2023   Lymphedema 07/24/2020   Chronic venous insufficiency 07/24/2020   Morbid obesity (HCC) 09/13/2018   Mild concentric left ventricular hypertrophy (LVH) 05/31/2018   Carotid stenosis 05/31/2018   Primary hyperparathyroidism 02/28/2016   Arthralgia of both knees 10/23/2015   Chronic bronchitis (HCC) 10/23/2015   Osteopenia 10/23/2015   Left ankle pain 04/25/2015   Constipation 04/25/2015   Allergic rhinitis 01/20/2015   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/2015   Diabetic sensorimotor neuropathy (HCC) 01/20/2015   Dyslipidemia 01/20/2015   Essential (primary) hypertension 01/20/2015   H/O iron deficiency anemia 01/20/2015   Hypertensive retinopathy 01/20/2015   Adult hypothyroidism 01/20/2015   Background retinopathy due to secondary diabetes (HCC) 01/20/2015   Tinea pedis 01/20/2015   Vitamin D  deficiency 02/26/2010   Cigarette smoker 03/06/2008    Past Surgical History:  Procedure Laterality Date   ABDOMINAL HYSTERECTOMY  08/11/1999  still has ovaries   BREAST BIOPSY Right 09/05/2018   Affirm Bx- Ribbon clip- path pending   CAROTID PTA/STENT INTERVENTION Right 07/01/2023   Procedure: CAROTID PTA/STENT INTERVENTION;  Surgeon: Marea Selinda RAMAN, MD;  Location: [facility];  Service: Cardiovascular;  Laterality:  Right;   COLONOSCOPY  08/11/2015   COLONOSCOPY WITH PROPOFOL  N/A 06/10/2016   Procedure: COLONOSCOPY WITH PROPOFOL ;  Surgeon: Louanne KANDICE Muse, MD;  Location: [facility];  Service: Endoscopy;  Laterality: N/A;   COLONOSCOPY WITH PROPOFOL  N/A 06/11/2021   Procedure: COLONOSCOPY WITH PROPOFOL ;  Surgeon: Dessa Reyes ORN, MD;  Location: [facility];  Service: Endoscopy;  Laterality: N/A;    Family History  Problem Relation Age of Onset   Multiple sclerosis Mother    Diabetes Father    Breast cancer Sister 20   Other Sister        DDD - she had to have back surgery   Hypertension Maternal Grandmother    Thyroid  disease Paternal Uncle    Breast cancer Paternal Aunt    Heart Problems Paternal Aunt     Social History   Tobacco Use   Smoking status: Every Day    Current packs/day: 1.00    Average packs/day: 1 pack/day for 47.9 years (47.9 ttl pk-yrs)    Types: Cigarettes    Start date: 08/10/1976   Smokeless tobacco: Never   Tobacco comments:    cutting down, smoking half pack lately   Substance Use Topics   Alcohol use: No    Alcohol/week: 0.0 standard drinks of alcohol     Current Outpatient Medications:    acetaminophen  (TYLENOL ) 500 MG tablet, Take 1 tablet (500 mg total) by mouth 2 (two) times daily., Disp: 30 tablet, Rfl: 0   albuterol  (VENTOLIN  HFA) 108 (90 Base) MCG/ACT inhaler, INHALE 2 PUFFS BY MOUTH EVERY 6 HOURS AS NEEDED FOR WHEEZING FOR SHORTNESS OF BREATH, Disp: 9 g, Rfl: 0   amLODipine -benazepril  (LOTREL) 5-20 MG capsule, Take 1 capsule by mouth daily., Disp: 90 capsule, Rfl: 1   aspirin 81 MG tablet, Take 1 tablet by mouth daily., Disp: , Rfl:    atorvastatin  (LIPITOR) 40 MG tablet, Take 1 tablet (40 mg total) by mouth daily., Disp: 90 tablet, Rfl: 1   Blood Glucose Monitoring Suppl (ONETOUCH VERIO FLEX SYSTEM) w/Device KIT, Use to check blood sugar up to twice daily as directed, Disp: 1 kit, Rfl: 0   budeson-glycopyrrolate-formoterol (BREZTRI   AEROSPHERE) 160-9-4.8 MCG/ACT AERO inhaler, Inhale 2 puffs into the lungs 2 (two) times daily., Disp: 32.1 g, Rfl: 3   Cholecalciferol (VITAMIN D ) 2000 UNITS tablet, Take 1 tablet by mouth daily., Disp: , Rfl:    clopidogrel  (PLAVIX ) 75 MG tablet, Take 1 tablet by mouth once daily, Disp: 90 tablet, Rfl: 3   ferrous sulfate 325 (65 FE) MG tablet, Take 1 tablet by mouth daily., Disp: , Rfl:    furosemide  (LASIX ) 20 MG tablet, Take 1 tablet (20 mg total) by mouth daily as needed., Disp: 90 tablet, Rfl: 1   gabapentin  (NEURONTIN ) 300 MG capsule, Take 1 capsule (300 mg total) by mouth 3 (three) times daily., Disp: 270 capsule, Rfl: 1   glucose blood (ONETOUCH VERIO) test strip, Use to check blood sugar up to twice daily, Disp: 200 each, Rfl: 3   Lancets (ONETOUCH DELICA PLUS LANCET30G) MISC, Use to check blood sugar up to twice daily, Disp: 200 each, Rfl: 3   levothyroxine  (SYNTHROID ) 150 MCG tablet, Take 150 mcg by mouth every morning.,  Disp: , Rfl:    metoprolol  succinate (TOPROL -XL) 50 MG 24 hr tablet, Take 1 tablet (50 mg total) by mouth daily. Take with or immediately following a meal., Disp: 90 tablet, Rfl: 1   naftifine  (NAFTIN ) 1 % cream, APPLY  CREAM TOPICALLY ONCE DAILY, Disp: 90 g, Rfl: 0   omega-3 fish oil (MAXEPA) 1000 MG CAPS capsule, Take 1 capsule by mouth daily., Disp: , Rfl:    Polyethylene Glycol 3350  POWD, Take 1 Dose by mouth as needed., Disp: , Rfl:    potassium chloride  SA (KLOR-CON  M) 20 MEQ tablet, Take 1 tablet (20 mEq total) by mouth daily., Disp: 90 tablet, Rfl: 1   Semaglutide , 1 MG/DOSE, 4 MG/3ML SOPN, Inject 1 mg as directed once a week., Disp: 9 mL, Rfl: 0   triamcinolone  cream (KENALOG ) 0.1 %, Apply 1 Application topically 2 (two) times daily. Mix with Eucerin or Nivea lotion ( at home  ), Disp: 453.6 g, Rfl: 0   MULTIPLE VITAMINS-MINERALS ER PO, Take 1 tablet by mouth daily. (Patient not taking: Reported on 07/19/2024), Disp: , Rfl:    varenicline  (CHANTIX  CONTINUING  MONTH PAK) 1 MG tablet, Take 1 tablet (1 mg total) by mouth 2 (two) times daily. (Patient not taking: Reported on 07/19/2024), Disp: 180 tablet, Rfl: 0   varenicline  (CHANTIX ) 0.5 MG tablet, Take 1 tablet (0.5 mg total) by mouth 2 (two) times daily. (Patient not taking: Reported on 07/19/2024), Disp: 60 tablet, Rfl: 0  Allergies  Allergen Reactions   Hydrochlorothiazide     hyperparathyroidism   Pneumococcal Vaccine Rash   Pneumovax [Pneumococcal Polysaccharide Vaccine]     I personally reviewed active problem list, medication list, allergies, family history with the patient/caregiver today.   ROS  Ten systems reviewed and is negative except as mentioned in HPI    Objective Physical Exam VITALS: BP- 130/80 MEASUREMENTS: Weight- 226. CONSTITUTIONAL: Patient appears well-developed and well-nourished. No distress. HEENT: Head atraumatic, normocephalic, neck supple. CARDIOVASCULAR: Normal rate, regular rhythm, and normal heart sounds. No murmur heard. No BLE edema. Feet dry with improved condition. Thick toenails and dry skin, improved. PULMONARY: Effort normal and breath sounds normal. No respiratory distress. ABDOMINAL: There is no tenderness or distention. MUSCULOSKELETAL: Normal gait. Without gross motor or sensory deficit. PSYCHIATRIC: Patient has a normal mood and affect. Behavior is normal. Judgment and thought content normal.  Vitals:   07/19/24 1013  BP: 130/80  Pulse: 68  Resp: 16  Weight: 226 lb (102.5 kg)  Height: 5' 7 (1.702 m)    Body mass index is 35.4 kg/m.  Recent Results (from the past 2160 hours)  POCT glycosylated hemoglobin (Hb A1C)     Status: None   Collection Time: 07/19/24 10:25 AM  Result Value Ref Range   Hemoglobin A1C 4.8 4.0 - 5.6 %   HbA1c POC (<> result, manual entry)     HbA1c, POC (prediabetic range)     HbA1c, POC (controlled diabetic range)      Diabetic Foot Exam:  Diabetic foot exam was performed with the following findings:    Normal sensation of 10g monofilament Intact posterior tibialis and dorsalis pedis pulses Thick toenails and dry skin      PHQ2/9:    07/19/2024   10:06 AM 03/20/2024   10:29 AM 11/19/2023    8:44 AM 06/03/2023   11:45 AM 04/23/2023    9:50 AM  Depression screen PHQ 2/9  Decreased Interest 0 0 0 0 0  Down, Depressed, Hopeless 0 0 0  0 0  PHQ - 2 Score 0 0 0 0 0  Altered sleeping   0 0 0  Tired, decreased energy   0 0 0  Change in appetite   0 0 0  Feeling bad or failure about yourself    0 0 0  Trouble concentrating   0 0 0  Moving slowly or fidgety/restless   0 0 0  Suicidal thoughts   0 0 0  PHQ-9 Score   0  0  0   Difficult doing work/chores   Not difficult at all  Not difficult at all     Data saved with a previous flowsheet row definition    phq 9 is negative  Fall Risk:    07/19/2024   10:06 AM 03/20/2024   10:29 AM 11/19/2023    8:44 AM 06/03/2023   11:53 AM 04/23/2023    9:50 AM  Fall Risk   Falls in the past year? 0 0 0 0 0  Number falls in past yr: 0 0 0  0  Injury with Fall? 0 0  0   0   Risk for fall due to : No Fall Risks No Fall Risks No Fall Risks No Fall Risks No Fall Risks  Follow up Falls evaluation completed Falls evaluation completed Falls prevention discussed;Education provided;Falls evaluation completed Falls prevention discussed;Education provided;Falls evaluation completed Falls prevention discussed;Education provided;Falls evaluation completed     Data saved with a previous flowsheet row definition      Assessment & Plan Type 2 diabetes mellitus complicated by diabetic neuropathy and hypertension Diabetes well-controlled with A1c of 4.8. Neuropathy managed with gabapentin . Hypertension controlled at 130/80 mmHg. - Continue Ozempic  1 mg weekly. - Continue gabapentin  300 mg four times a day. - Continue metoprolol , amlodipine , benazepril , and furosemide . - Checked thyroid  function today and sent results to Dr. Cherilyn.  Morbid  obesity Weight reduced from 229 lbs to 226 lbs with Ozempic . BMI remains over 35. - Continue Ozempic  1 mg weekly. - Encouraged continued weight loss and balanced diet with adequate protein.  Paraseptal emphysema and chronic bronchitis Well-managed with inhalers. Smoking cessation encouraged. - Continue breztri  and albuterol  inhalers as needed. - Encouraged smoking cessation.  Adult hypothyroidism Managed with levothyroxine . Thyroid  function monitored by Dr. Cherilyn. - Continue levothyroxine  150 mcg daily, with 75 mcg on Sundays. - Checked thyroid  function today and sent results to Dr. Cherilyn.  Primary hyperparathyroidism Monitored conservatively due to previous surgical indications. Calcium  levels stable. - Continue monitoring calcium  levels and thyroid  function.  Osteopenia  Monitored by Dr. Cherilyn. Advised against calcium  supplements. - Continue monitoring bone density. - Avoid calcium  supplements.  Bilateral carotid artery stenosis Moderate left stenosis managed with aspirin, statin, and Plavix . No severe symptoms. - Continue aspirin, statin, and Plavix . - Continue monitoring carotid artery stenosis.  Paroxysmal atrial contractions Managed with metoprolol . No severe symptoms. - Continue metoprolol .  Chronic bilateral knee pain Intermittent pain likely due to arthritis. No surgical intervention needed. - Continue current management without surgical intervention.  Tobacco use Smokes 1.5 packs/day. Considering quitting. Previous Chantix  use unsuccessful due to cost. - Encouraged smoking cessation. - Consider nicotine  replacement therapies such as patches, lozenges, or gum.  General health maintenance Due for lung cancer screening CT scan. - Ordered lung cancer screening CT scan.

## 2024-07-20 LAB — TSH: TSH: 6.37 m[IU]/L — ABNORMAL HIGH (ref 0.40–4.50)

## 2024-07-21 ENCOUNTER — Ambulatory Visit: Payer: Self-pay | Admitting: Family Medicine

## 2024-08-08 ENCOUNTER — Ambulatory Visit (INDEPENDENT_AMBULATORY_CARE_PROVIDER_SITE_OTHER): Payer: Medicare (Managed Care)

## 2024-08-08 VITALS — Ht 67.0 in | Wt 228.0 lb

## 2024-08-08 DIAGNOSIS — Z0000 Encounter for general adult medical examination without abnormal findings: Secondary | ICD-10-CM

## 2024-08-08 NOTE — Progress Notes (Signed)
 "  Chief Complaint  Patient presents with   Medicare Wellness     Subjective:   Charlene Hardy is a 68 y.o. female who presents for a Medicare Annual Wellness Visit.  Visit info / Clinical Intake: Medicare Wellness Visit Type:: Initial Annual Wellness Visit Persons participating in visit and providing information:: patient Medicare Wellness Visit Mode:: Telephone If telephone:: video declined Since this visit was completed virtually, some vitals may be partially provided or unavailable. Missing vitals are due to the limitations of the virtual format.: Documented vitals are patient reported If Telephone or Video please confirm:: I connected with patient using audio/video enable telemedicine. I verified patient identity with two identifiers, discussed telehealth limitations, and patient agreed to proceed. Patient Location:: home Provider Location:: home office Interpreter Needed?: No Pre-visit prep was completed: yes AWV questionnaire completed by patient prior to visit?: no Living arrangements:: (!) lives alone Patient's Overall Health Status Rating: good Typical amount of pain: some Does pain affect daily life?: no Are you currently prescribed opioids?: no  Dietary Habits and Nutritional Risks How many meals a day?: 2 Eats fruit and vegetables daily?: yes Most meals are obtained by: preparing own meals In the last 2 weeks, have you had any of the following?: none Diabetic:: (!) yes Any non-healing wounds?: no How often do you check your BS?: 0 Would you like to be referred to a Nutritionist or for Diabetic Management? : no  Functional Status Activities of Daily Living (to include ambulation/medication): Independent Ambulation: Independent Medication Administration: Independent Home Management (perform basic housework or laundry): Independent Manage your own finances?: yes Primary transportation is: driving Concerns about vision?: no *vision screening is required for  WTM* Concerns about hearing?: no  Fall Screening Falls in the past year?: 0 Number of falls in past year: 0 Was there an injury with Fall?: 0 Fall Risk Category Calculator: 0 Patient Fall Risk Level: Low Fall Risk  Fall Risk Patient at Risk for Falls Due to: Medication side effect Fall risk Follow up: Falls prevention discussed; Falls evaluation completed  Home and Transportation Safety: All rugs have non-skid backing?: N/A, no rugs All stairs or steps have railings?: yes Grab bars in the bathtub or shower?: (!) no Have non-skid surface in bathtub or shower?: yes Good home lighting?: yes Regular seat belt use?: yes Hospital stays in the last year:: no  Cognitive Assessment Difficulty concentrating, remembering, or making decisions? : no Will 6CIT or Mini Cog be Completed: yes What year is it?: 0 points What month is it?: 0 points Give patient an address phrase to remember (5 components): [address] About what time is it?: 0 points Count backwards from 20 to 1: 0 points Say the months of the year in reverse: 4 points Repeat the address phrase from earlier: 0 points 6 CIT Score: 4 points  Advance Directives (For Healthcare) Does Patient Have a Medical Advance Directive?: No Would patient like information on creating a medical advance directive?: No - Patient declined  Reviewed/Updated  Reviewed/Updated: Reviewed All (Medical, Surgical, Family, Medications, Allergies, Care Teams, Patient Goals)    Allergies (verified) Hydrochlorothiazide, Pneumococcal vaccine, and Pneumovax [pneumococcal polysaccharide vaccine]   Current Medications (verified) Outpatient Encounter Medications as of 08/08/2024  Medication Sig   acetaminophen  (TYLENOL ) 500 MG tablet Take 1 tablet (500 mg total) by mouth 2 (two) times daily.   albuterol  (VENTOLIN  HFA) 108 (90 Base) MCG/ACT inhaler INHALE 2 PUFFS BY MOUTH EVERY 6 HOURS AS NEEDED FOR WHEEZING FOR SHORTNESS OF  BREATH    amLODipine -benazepril  (LOTREL) 5-20 MG capsule Take 1 capsule by mouth daily.   aspirin 81 MG tablet Take 1 tablet by mouth daily.   atorvastatin  (LIPITOR) 40 MG tablet Take 1 tablet (40 mg total) by mouth daily.   Blood Glucose Monitoring Suppl (ONETOUCH VERIO FLEX SYSTEM) w/Device KIT Use to check blood sugar up to twice daily as directed   budeson-glycopyrrolate-formoterol (BREZTRI  AEROSPHERE) 160-9-4.8 MCG/ACT AERO inhaler Inhale 2 puffs into the lungs 2 (two) times daily.   Cholecalciferol (VITAMIN D ) 2000 UNITS tablet Take 1 tablet by mouth daily.   clopidogrel  (PLAVIX ) 75 MG tablet Take 1 tablet by mouth once daily   ferrous sulfate 325 (65 FE) MG tablet Take 1 tablet by mouth daily.   furosemide  (LASIX ) 20 MG tablet Take 1 tablet (20 mg total) by mouth daily as needed.   gabapentin  (NEURONTIN ) 300 MG capsule Take 1 capsule (300 mg total) by mouth 3 (three) times daily.   glucose blood (ONETOUCH VERIO) test strip Use to check blood sugar up to twice daily   Lancets (ONETOUCH DELICA PLUS LANCET30G) MISC Use to check blood sugar up to twice daily   levothyroxine  (SYNTHROID ) 150 MCG tablet Take 150 mcg by mouth every morning. Monday through Sat and half on Sundays   metoprolol  succinate (TOPROL -XL) 50 MG 24 hr tablet Take 1 tablet (50 mg total) by mouth daily. Take with or immediately following a meal.   naftifine  (NAFTIN ) 1 % cream APPLY  CREAM TOPICALLY ONCE DAILY   omega-3 fish oil (MAXEPA) 1000 MG CAPS capsule Take 1 capsule by mouth daily.   Polyethylene Glycol 3350  POWD Take 1 Dose by mouth as needed.   potassium chloride  SA (KLOR-CON  M) 20 MEQ tablet Take 1 tablet (20 mEq total) by mouth daily.   Semaglutide , 1 MG/DOSE, 4 MG/3ML SOPN Inject 1 mg as directed once a week.   triamcinolone  cream (KENALOG ) 0.1 % Apply 1 Application topically 2 (two) times daily. Mix with Eucerin or Nivea lotion ( at home  )   No facility-administered encounter medications on file as of 08/08/2024.     History: Past Medical History:  Diagnosis Date   Arthritis    Diabetes mellitus without complication (HCC)    Heart murmur    Hyperlipidemia    Hypertension    Thyroid  disease    Past Surgical History:  Procedure Laterality Date   ABDOMINAL HYSTERECTOMY  08/11/1999   still has ovaries   BREAST BIOPSY Right 09/05/2018   Affirm Bx- Ribbon clip- path pending   CAROTID PTA/STENT INTERVENTION Right 07/01/2023   Procedure: CAROTID PTA/STENT INTERVENTION;  Surgeon: Marea Selinda RAMAN, MD;  Location: [facility];  Service: Cardiovascular;  Laterality: Right;   COLONOSCOPY  08/11/2015   COLONOSCOPY WITH PROPOFOL  N/A 06/10/2016   Procedure: COLONOSCOPY WITH PROPOFOL ;  Surgeon: Louanne KANDICE Muse, MD;  Location: [facility] ENDOSCOPY;  Service: Endoscopy;  Laterality: N/A;   COLONOSCOPY WITH PROPOFOL  N/A 06/11/2021   Procedure: COLONOSCOPY WITH PROPOFOL ;  Surgeon: Dessa Reyes ORN, MD;  Location: [facility] ENDOSCOPY;  Service: Endoscopy;  Laterality: N/A;   Family History  Problem Relation Age of Onset   Multiple sclerosis Mother    Diabetes Father    Breast cancer Sister 14   Other Sister        DDD - she had to have back surgery   Hypertension Maternal Grandmother    Thyroid  disease Paternal Uncle    Breast cancer Paternal Aunt    Heart Problems Paternal  Aunt    Social History   Occupational History   Not on file  Tobacco Use   Smoking status: Every Day    Current packs/day: 1.00    Average packs/day: 1 pack/day for 48.0 years (48.0 ttl pk-yrs)    Types: Cigarettes    Start date: 08/10/1976   Smokeless tobacco: Never   Tobacco comments:    cutting down, smoking half pack lately   Vaping Use   Vaping status: Never Used  Substance and Sexual Activity   Alcohol use: No    Alcohol/week: 0.0 standard drinks of alcohol   Drug use: No   Sexual activity: Not Currently   Tobacco Counseling Ready to quit: Yes Counseling given: Not Answered Tobacco comments: cutting down,  smoking half pack lately   SDOH Screenings   Food Insecurity: No Food Insecurity (08/08/2024)  Housing: Unknown (08/08/2024)  Transportation Needs: No Transportation Needs (08/08/2024)  Utilities: Not At Risk (08/08/2024)  Alcohol Screen: Low Risk (08/08/2024)  Depression (PHQ2-9): Low Risk (08/08/2024)  Financial Resource Strain: Low Risk (08/08/2024)  Physical Activity: Inactive (08/08/2024)  Social Connections: Moderately Isolated (08/08/2024)  Stress: No Stress Concern Present (08/08/2024)  Tobacco Use: High Risk (08/08/2024)  Health Literacy: Adequate Health Literacy (08/08/2024)   See flowsheets for full screening details  Depression Screen PHQ 2 & 9 Depression Scale- Over the past 2 weeks, how often have you been bothered by any of the following problems? Little interest or pleasure in doing things: 0 Feeling down, depressed, or hopeless (PHQ Adolescent also includes...irritable): 0 PHQ-2 Total Score: 0 Trouble falling or staying asleep, or sleeping too much: 0 Feeling tired or having little energy: 0 Poor appetite or overeating (PHQ Adolescent also includes...weight loss): 0 Feeling bad about yourself - or that you are a failure or have let yourself or your family down: 0 Trouble concentrating on things, such as reading the newspaper or watching television (PHQ Adolescent also includes...like school work): 0 Moving or speaking so slowly that other people could have noticed. Or the opposite - being so fidgety or restless that you have been moving around a lot more than usual: 0 Thoughts that you would be better off dead, or of hurting yourself in some way: 0 PHQ-9 Total Score: 0 If you checked off any problems, how difficult have these problems made it for you to do your work, take care of things at home, or get along with other people?: Not difficult at all  Depression Treatment Depression Interventions/Treatment : EYV7-0 Score <4 Follow-up Not Indicated     Goals  Addressed             This Visit's Progress    Patient Stated       08/08/2024, quit smoking             Objective:    Today's Vitals   08/08/24 1051  Weight: 228 lb (103.4 kg)  Height: 5' 7 (1.702 m)   Body mass index is 35.71 kg/m.  Hearing/Vision screen Hearing Screening - Comments:: Denies hearing issues Vision Screening - Comments:: Regular eye exams, [facility] Immunizations and Health Maintenance Health Maintenance  Topic Date Due   COVID-19 Vaccine (4 - 2025-26 season) 04/10/2024   Lung Cancer Screening  06/13/2024   Diabetic kidney evaluation - eGFR measurement  11/18/2024   Diabetic kidney evaluation - Urine ACR  11/18/2024   HEMOGLOBIN A1C  01/17/2025   OPHTHALMOLOGY EXAM  05/11/2025   FOOT EXAM  07/19/2025   Medicare Annual Wellness (AWV)  08/08/2025   Mammogram  10/14/2025   Colonoscopy  06/11/2026   DTaP/Tdap/Td (3 - Td or Tdap) 05/31/2028   Pneumococcal Vaccine: 50+ Years  Completed   Influenza Vaccine  Completed   Bone Density Scan  Completed   Hepatitis C Screening  Completed   Zoster Vaccines- Shingrix  Completed   Meningococcal B Vaccine  Aged Out        Assessment/Plan:  This is a routine wellness examination for Charlene Hardy.  Patient Care Team: Sowles, Krichna, MD as PCP - General (Family Medicine) Sowles, Krichna, MD as Attending Physician (Family Medicine) Darliss Rogue, MD as Consulting Physician (Cardiology) Schnier, Cordella MATSU, MD (Vascular Surgery) Cherilyn Debby CROME, MD as Consulting Physician (Internal Medicine) Marsh, Allyson E, RPH-CPP as Pharmacist  I have personally reviewed and noted the following in the patients chart:   Medical and social history Use of alcohol, tobacco or illicit drugs  Current medications and supplements including opioid prescriptions. Functional ability and status Nutritional status Physical activity Advanced directives List of other physicians Hospitalizations, surgeries, and ER  visits in previous 12 months Vitals Screenings to include cognitive, depression, and falls Referrals and appointments  No orders of the defined types were placed in this encounter.  In addition, I have reviewed and discussed with patient certain preventive protocols, quality metrics, and best practice recommendations. A written personalized care plan for preventive services as well as general preventive health recommendations were provided to patient.   Charlene FORBES Dawn, LPN   [id number]   Return in 1 year (on 08/08/2025).  After Visit Summary: (Pick Up) Due to this being a telephonic visit, with patients personalized plan was offered to patient and patient has requested to Pick up at office.  Nurse Notes: HM Addressed: Vaccines Due: covid Lung Cancer screening ordered on 07/19/2024  "

## 2024-08-08 NOTE — Patient Instructions (Signed)
 Charlene Hardy,  Thank you for taking the time for your Medicare Wellness Visit. I appreciate your continued commitment to your health goals. Please review the care plan we discussed, and feel free to reach out if I can assist you further.  Please note that Annual Wellness Visits do not include a physical exam. Some assessments may be limited, especially if the visit was conducted virtually. If needed, we may recommend an in-person follow-up with your provider.  Ongoing Care Seeing your primary care provider every 3 to 6 months helps us  monitor your health and provide consistent, personalized care.   Referrals If a referral was made during today's visit and you haven't received any updates within two weeks, please contact the referred provider directly to check on the status.  Recommended Screenings:  Health Maintenance  Topic Date Due   COVID-19 Vaccine (4 - 2025-26 season) 04/10/2024   Medicare Annual Wellness Visit  06/02/2024   Screening for Lung Cancer  06/13/2024   Yearly kidney function blood test for diabetes  11/18/2024   Yearly kidney health urinalysis for diabetes  11/18/2024   Hemoglobin A1C  01/17/2025   Eye exam for diabetics  05/11/2025   Complete foot exam   07/19/2025   Breast Cancer Screening  10/14/2025   Colon Cancer Screening  06/11/2026   DTaP/Tdap/Td vaccine (3 - Td or Tdap) 05/31/2028   Pneumococcal Vaccine for age over 39  Completed   Flu Shot  Completed   Osteoporosis screening with Bone Density Scan  Completed   Hepatitis C Screening  Completed   Zoster (Shingles) Vaccine  Completed   Meningitis B Vaccine  Aged Out       08/08/2024   10:57 AM  Advanced Directives  Does Patient Have a Medical Advance Directive? No  Would patient like information on creating a medical advance directive? No - Patient declined    Vision: Annual vision screenings are recommended for early detection of glaucoma, cataracts, and diabetic retinopathy. These exams can also  reveal signs of chronic conditions such as diabetes and high blood pressure.  Dental: Annual dental screenings help detect early signs of oral cancer, gum disease, and other conditions linked to overall health, including heart disease and diabetes.  Please see the attached documents for additional preventive care recommendations.

## 2024-08-22 ENCOUNTER — Other Ambulatory Visit (HOSPITAL_COMMUNITY): Payer: Self-pay

## 2024-08-22 ENCOUNTER — Other Ambulatory Visit: Payer: Self-pay

## 2024-08-22 ENCOUNTER — Ambulatory Visit: Payer: Medicare (Managed Care)

## 2024-08-22 DIAGNOSIS — Z7984 Long term (current) use of oral hypoglycemic drugs: Secondary | ICD-10-CM

## 2024-08-22 DIAGNOSIS — Z720 Tobacco use: Secondary | ICD-10-CM

## 2024-08-22 DIAGNOSIS — E1140 Type 2 diabetes mellitus with diabetic neuropathy, unspecified: Secondary | ICD-10-CM

## 2024-08-22 DIAGNOSIS — Z7985 Long-term (current) use of injectable non-insulin antidiabetic drugs: Secondary | ICD-10-CM | POA: Diagnosis not present

## 2024-08-22 NOTE — Patient Instructions (Signed)
 Please contact Medicare office at [phone] to find out if you were approved Medicare Extra Help  Charlene Hardy, PharmD, BCACP, CPP Clinical Pharmacist [facility] [phone]

## 2024-08-22 NOTE — Progress Notes (Signed)
 "  S:     Reason for visit: ?  Charlene Hardy is a 69 y.o. female with a history of diabetes (type 2), who presents today for a follow up diabetes Telephone pharmacotherapy visit.? Pertinent PMH also includes HTN, HLD, obesity, hyperparathyroidism, osteopenia.   Care Team: Primary Care Provider: Sowles, Krichna, MD  Patient was enrolled in Medicare Extra Help on 05/17/24. She reports she has not yet received a letter in the mail regarding the status of this application. She plans to call the Medicare office later today for an update.   Current diabetes medications include: Ozempic  1 mg weekly Previous diabetes medications include: metformin , Trulicity  Current hypertension medications include: amlodipine /benazepril  5/20 mg daily, metoprolol  succinate 50 mg daily, furosemide  20 mg daily prn Current hyperlipidemia medications include: atorvastatin  40 mg daily  Patient reports adherence to taking all medications as prescribed.   Have you been experiencing any side effects to the medications prescribed? no Do you have any problems obtaining medications due to transportation or finances? no Insurance coverage: [employer]  Current medication access support:  [employer]  via [place] until 08/09/25  Patient denies hypoglycemic events.  Reported home fasting blood sugars: not checking   Patient reported dietary habits: Eats 2-3 meals/day Breakfast: grilled cheese Lunch: broccoli & cheese, TV dinners Dinner: Cabbage, pinto beans, chicken  Patient-reported exercise habits: denies  Tobacco Abuse:  Tobacco Use History: Number of cigarettes per day 1.5 ppd - down to 5 a day Does wake at night to smoke Triggers include boredom, eating, playing online gambling  Patient reports the QUIT Line should be mailing out some nicotine  patches over the next week  DM Prevention:  Statin: Taking; moderate intensity.?  ACE/ARB: yes; benazepril  Last urinary albumin/creatinine ratio:  Lab Results   Component Value Date   MICRALBCREAT 8 11/19/2023   MICRALBCREAT 2 10/21/2022   MICRALBCREAT 104 (H) 10/21/2021   MICRALBCREAT NOTE 02/24/2018   Last eye exam:  Lab Results  Component Value Date   HMDIABEYEEXA No Retinopathy 01/28/2021   Lab Results  Component Value Date   HMDIABEYEEXA No Retinopathy 01/28/2021   Last foot exam: No foot exam found Tobacco Use:  Tobacco Use: High Risk (08/08/2024)   Patient History    Smoking Tobacco Use: Every Day    Smokeless Tobacco Use: Never    Passive Exposure: Not on file   O:  Vitals:  Wt Readings from Last 3 Encounters:  08/08/24 228 lb (103.4 kg)  07/19/24 226 lb (102.5 kg)  06/21/24 228 lb 9.6 oz (103.7 kg)   BP Readings from Last 3 Encounters:  07/19/24 130/80  06/21/24 (!) 144/76  04/04/24 106/71   Pulse Readings from Last 3 Encounters:  07/19/24 68  06/21/24 73  04/04/24 66     Labs:?  Lab Results  Component Value Date   HGBA1C 4.8 07/19/2024   HGBA1C 5.4 03/20/2024   HGBA1C 5.4 11/19/2023   GLUCOSE 69 11/19/2023   MICRALBCREAT 8 11/19/2023   MICRALBCREAT 2 10/21/2022   MICRALBCREAT 104 (H) 10/21/2021   CREATININE 0.69 11/19/2023   CREATININE 0.81 07/01/2023   CREATININE 0.80 06/02/2023    Lab Results  Component Value Date   CHOL 97 11/19/2023   LDLCALC 44 11/19/2023   LDLCALC 78 09/03/2022   LDLCALC 50 10/21/2021   HDL 33 (L) 11/19/2023   TRIG 118 11/19/2023   TRIG 122 09/03/2022   TRIG 128 10/21/2021   ALT 7 11/19/2023   ALT 7 09/03/2022   AST 10 11/19/2023   AST  10 (A) 09/03/2022      Chemistry      Component Value Date/Time   NA 138 11/19/2023 0958   NA 139 09/03/2022 0000   NA 133 (L) 08/18/2014 1941   K 4.4 11/19/2023 0958   K 3.9 08/18/2014 1941   CL 104 11/19/2023 0958   CL 100 08/18/2014 1941   CO2 31 11/19/2023 0958   CO2 28 08/18/2014 1941   BUN 9 11/19/2023 0958   BUN 10 09/03/2022 0000   BUN 9 08/18/2014 1941   CREATININE 0.69 11/19/2023 0958   GLU 80 09/03/2022 0000       Component Value Date/Time   CALCIUM  10.9 (H) 11/19/2023 0958   CALCIUM  9.7 08/18/2014 1941   ALKPHOS 112 09/03/2022 0000   ALKPHOS 105 08/18/2014 1941   AST 10 11/19/2023 0958   AST 11 (L) 08/18/2014 1941   ALT 7 11/19/2023 0958   ALT 18 08/18/2014 1941   BILITOT 0.4 11/19/2023 0958   BILITOT 0.3 04/25/2015 0857   BILITOT 0.4 08/18/2014 1941       The ASCVD Risk score (Arnett DK, et al., 2019) failed to calculate for the following reasons:   The valid total cholesterol range is 130 to 320 mg/dL  Lab Results  Component Value Date   MICRALBCREAT 8 11/19/2023   MICRALBCREAT 2 10/21/2022   MICRALBCREAT 104 (H) 10/21/2021   MICRALBCREAT NOTE 02/24/2018    A/P: Diabetes currently controlled with a most recent A1c of 4.8% on 07/19/25. Patient is able to verbalize appropriate hypoglycemia management plan. Medication adherence appears appropriate. Currently awaiting letter regarding Medicare Extra Help approval/denial; patient is planning to call the Medicare office to provide this update. Explained if it is denied, may need to discuss Trulicity  patient assistance.  -Continued GLP-1 Ozempic  (semaglutide ) 1 mg weekly  -Extensively discussed pathophysiology of diabetes, recommended lifestyle interventions, dietary effects on blood sugar control.  -Counseled on s/sx of and management of hypoglycemia.  -Provided contact information for social security office that assists with Medicare Extra Help to check on status of enrollment again -Patient set goal to use exercise bike 2 times per week  ASCVD risk - secondary prevention in patient with diabetes. Last LDL is 44 mg/dL, at goal of <29 mg/dL.  -Continued atorvastatin  40 mg daily.   Tobacco Abuse - Currently uncontrolled but improved from 1.5 ppd to 5 cigarettes per day. Patient reports she should be receiving free NRT patches from the 1-800 QUIT NOW line in the coming week - Provided motivational interviewing to assess tobacco use and  strategies for reduction   Patient verbalized understanding of treatment plan. Total time patient counseling 45 minutes.  Follow-up:  Pharmacist on 09/25/24 PCP clinic visit on 11/17/24  [name], PharmD, BCACP, CPP Clinical Pharmacist [facility] [phone]    "

## 2024-08-23 ENCOUNTER — Ambulatory Visit
Admission: RE | Admit: 2024-08-23 | Discharge: 2024-08-23 | Disposition: A | Discharge status: Home / Self Care | Payer: Self-pay | Source: Ambulatory Visit | Attending: Acute Care | Admitting: Acute Care

## 2024-08-23 ENCOUNTER — Other Ambulatory Visit: Payer: Self-pay

## 2024-08-23 DIAGNOSIS — Z122 Encounter for screening for malignant neoplasm of respiratory organs: Secondary | ICD-10-CM | POA: Insufficient documentation

## 2024-08-23 DIAGNOSIS — J439 Emphysema, unspecified: Secondary | ICD-10-CM | POA: Insufficient documentation

## 2024-08-23 DIAGNOSIS — I700 Atherosclerosis of aorta: Secondary | ICD-10-CM | POA: Diagnosis not present

## 2024-08-23 DIAGNOSIS — F17210 Nicotine dependence, cigarettes, uncomplicated: Secondary | ICD-10-CM | POA: Insufficient documentation

## 2024-08-23 DIAGNOSIS — Z87891 Personal history of nicotine dependence: Secondary | ICD-10-CM | POA: Insufficient documentation

## 2024-08-24 ENCOUNTER — Telehealth: Payer: Self-pay

## 2024-08-24 NOTE — Telephone Encounter (Signed)
 Brief Telephone Documentation Reason for Call: Medicare Extra Help  Summary of Call: Patient reported she is scheduled to meet with a Medicare representative regarding Medicare Extra Help enrollment on 09/01/24. She plans to reach back out with update after that time.  Follow Up: Patient given direct line for further questions/concerns.  Puneet Selden E. Marsh, PharmD, BCACP, CPP Clinical Pharmacist [facility] [phone]

## 2024-09-01 ENCOUNTER — Other Ambulatory Visit: Payer: Self-pay

## 2024-09-01 DIAGNOSIS — F17210 Nicotine dependence, cigarettes, uncomplicated: Secondary | ICD-10-CM

## 2024-09-01 DIAGNOSIS — Z87891 Personal history of nicotine dependence: Secondary | ICD-10-CM

## 2024-09-01 DIAGNOSIS — Z122 Encounter for screening for malignant neoplasm of respiratory organs: Secondary | ICD-10-CM

## 2024-09-01 NOTE — Telephone Encounter (Signed)
 Brief Telephone Documentation Reason for Call: Medicare Extra Help  Summary of Call: Patient reported that the Medicare representative had an incorrect address on file. She should be receiving an application update via the mail sometime in the next 10 days  Follow Up: Patient given direct line for further questions/concerns.  Lionell Matuszak E. Marsh, PharmD, BCACP, CPP Clinical Pharmacist [facility] [phone]

## 2024-09-25 ENCOUNTER — Ambulatory Visit

## 2024-10-05 ENCOUNTER — Encounter (INDEPENDENT_AMBULATORY_CARE_PROVIDER_SITE_OTHER): Payer: Medicare (Managed Care)

## 2024-10-05 ENCOUNTER — Ambulatory Visit (INDEPENDENT_AMBULATORY_CARE_PROVIDER_SITE_OTHER): Payer: Medicare (Managed Care) | Admitting: Nurse Practitioner

## 2024-11-17 ENCOUNTER — Ambulatory Visit: Payer: Medicare (Managed Care) | Admitting: Family Medicine

## 2025-08-16 ENCOUNTER — Ambulatory Visit: Payer: Medicare (Managed Care)
